# Patient Record
Sex: Female | Born: 1945
Health system: Southern US, Community
[De-identification: ages and names within clinical notes are randomized; demographics above are authoritative.]

## PROBLEM LIST (undated history)

## (undated) DIAGNOSIS — Z8619 Personal history of other infectious and parasitic diseases: Secondary | ICD-10-CM

## (undated) DIAGNOSIS — R52 Pain, unspecified: Secondary | ICD-10-CM

## (undated) DIAGNOSIS — F329 Major depressive disorder, single episode, unspecified: Secondary | ICD-10-CM

## (undated) DIAGNOSIS — B1710 Acute hepatitis C without hepatic coma: Secondary | ICD-10-CM

## (undated) DIAGNOSIS — L989 Disorder of the skin and subcutaneous tissue, unspecified: Secondary | ICD-10-CM

## (undated) DIAGNOSIS — N83209 Unspecified ovarian cyst, unspecified side: Secondary | ICD-10-CM

## (undated) DIAGNOSIS — K5730 Diverticulosis of large intestine without perforation or abscess without bleeding: Secondary | ICD-10-CM

## (undated) DIAGNOSIS — I10 Essential (primary) hypertension: Secondary | ICD-10-CM

## (undated) DIAGNOSIS — J984 Other disorders of lung: Secondary | ICD-10-CM

## (undated) DIAGNOSIS — T783XXA Angioneurotic edema, initial encounter: Secondary | ICD-10-CM

## (undated) DIAGNOSIS — E119 Type 2 diabetes mellitus without complications: Secondary | ICD-10-CM

## (undated) DIAGNOSIS — M949 Disorder of cartilage, unspecified: Secondary | ICD-10-CM

## (undated) DIAGNOSIS — F419 Anxiety disorder, unspecified: Secondary | ICD-10-CM

## (undated) DIAGNOSIS — Z972 Presence of dental prosthetic device (complete) (partial): Secondary | ICD-10-CM

## (undated) DIAGNOSIS — N6019 Diffuse cystic mastopathy of unspecified breast: Secondary | ICD-10-CM

## (undated) DIAGNOSIS — E785 Hyperlipidemia, unspecified: Secondary | ICD-10-CM

## (undated) DIAGNOSIS — B1920 Unspecified viral hepatitis C without hepatic coma: Secondary | ICD-10-CM

## (undated) DIAGNOSIS — IMO0002 Reserved for concepts with insufficient information to code with codable children: Secondary | ICD-10-CM

## (undated) DIAGNOSIS — M25519 Pain in unspecified shoulder: Secondary | ICD-10-CM

## (undated) DIAGNOSIS — Z9189 Other specified personal risk factors, not elsewhere classified: Secondary | ICD-10-CM

## (undated) DIAGNOSIS — Z973 Presence of spectacles and contact lenses: Secondary | ICD-10-CM

## (undated) DIAGNOSIS — R05 Cough: Secondary | ICD-10-CM

## (undated) DIAGNOSIS — R569 Unspecified convulsions: Secondary | ICD-10-CM

## (undated) DIAGNOSIS — M8580 Other specified disorders of bone density and structure, unspecified site: Secondary | ICD-10-CM

## (undated) DIAGNOSIS — M899 Disorder of bone, unspecified: Secondary | ICD-10-CM

## (undated) DIAGNOSIS — F411 Generalized anxiety disorder: Secondary | ICD-10-CM

## (undated) DIAGNOSIS — M549 Dorsalgia, unspecified: Secondary | ICD-10-CM

## (undated) DIAGNOSIS — J019 Acute sinusitis, unspecified: Secondary | ICD-10-CM

## (undated) DIAGNOSIS — Z9079 Acquired absence of other genital organ(s): Secondary | ICD-10-CM

## (undated) DIAGNOSIS — M545 Low back pain: Secondary | ICD-10-CM

## (undated) DIAGNOSIS — F32A Depression, unspecified: Secondary | ICD-10-CM

## (undated) DIAGNOSIS — S0990XA Unspecified injury of head, initial encounter: Secondary | ICD-10-CM

## (undated) HISTORY — DX: Diffuse cystic mastopathy of unspecified breast: N60.19

## (undated) HISTORY — DX: Hyperlipidemia, unspecified: E78.5

## (undated) HISTORY — DX: Dorsalgia, unspecified: M54.9

## (undated) HISTORY — DX: Pain in unspecified shoulder: M25.519

## (undated) HISTORY — DX: Disorder of bone, unspecified: M89.9

## (undated) HISTORY — DX: Other specified personal risk factors, not elsewhere classified: Z91.89

## (undated) HISTORY — DX: Reserved for concepts with insufficient information to code with codable children: IMO0002

## (undated) HISTORY — DX: Acute sinusitis, unspecified: J01.90

## (undated) HISTORY — DX: Other disorders of lung: J98.4

## (undated) HISTORY — PX: TUBAL LIGATION: SHX77

## (undated) HISTORY — DX: Anxiety disorder, unspecified: F41.9

## (undated) HISTORY — DX: Essential (primary) hypertension: I10

## (undated) HISTORY — DX: Acquired absence of other genital organ(s): Z90.79

## (undated) HISTORY — DX: Unspecified ovarian cyst, unspecified side: N83.209

## (undated) HISTORY — PX: APPENDECTOMY: SHX54

## (undated) HISTORY — DX: Pain, unspecified: R52

## (undated) HISTORY — DX: Low back pain: M54.5

## (undated) HISTORY — DX: Unspecified injury of head, initial encounter: S09.90XA

## (undated) HISTORY — DX: Cough: R05

## (undated) HISTORY — DX: Depression, unspecified: F32.A

## (undated) HISTORY — DX: Major depressive disorder, single episode, unspecified: F32.9

## (undated) HISTORY — DX: Angioneurotic edema, initial encounter: T78.3XXA

## (undated) HISTORY — DX: Generalized anxiety disorder: F41.1

## (undated) HISTORY — PX: OVARIAN CYST SURGERY: SHX726

## (undated) HISTORY — PX: LIVER BIOPSY: SHX301

## (undated) HISTORY — PX: COLONOSCOPY: SHX174

## (undated) HISTORY — DX: Other specified disorders of bone density and structure, unspecified site: M85.80

## (undated) HISTORY — DX: Disorder of cartilage, unspecified: M94.9

## (undated) HISTORY — DX: Acute hepatitis C without hepatic coma: B17.10

## (undated) HISTORY — DX: Personal history of other infectious and parasitic diseases: Z86.19

## (undated) HISTORY — DX: Disorder of the skin and subcutaneous tissue, unspecified: L98.9

## (undated) HISTORY — DX: Type 2 diabetes mellitus without complications: E11.9

## (undated) HISTORY — DX: Diverticulosis of large intestine without perforation or abscess without bleeding: K57.30

## (undated) HISTORY — DX: Unspecified viral hepatitis C without hepatic coma: B19.20

---

## 1990-03-27 DIAGNOSIS — Z9079 Acquired absence of other genital organ(s): Secondary | ICD-10-CM | POA: Insufficient documentation

## 1990-03-27 HISTORY — DX: Acquired absence of other genital organ(s): Z90.79

## 2003-06-10 ENCOUNTER — Ambulatory Visit (HOSPITAL_COMMUNITY): Admission: RE | Admit: 2003-06-10 | Discharge: 2003-06-10 | Payer: Self-pay | Admitting: Gastroenterology

## 2003-06-26 ENCOUNTER — Encounter (INDEPENDENT_AMBULATORY_CARE_PROVIDER_SITE_OTHER): Payer: Self-pay | Admitting: Specialist

## 2003-06-26 ENCOUNTER — Ambulatory Visit (HOSPITAL_COMMUNITY): Admission: RE | Admit: 2003-06-26 | Discharge: 2003-06-26 | Payer: Self-pay | Admitting: Gastroenterology

## 2003-08-15 ENCOUNTER — Observation Stay (HOSPITAL_COMMUNITY): Admission: EM | Admit: 2003-08-15 | Discharge: 2003-08-17 | Payer: Self-pay | Admitting: Emergency Medicine

## 2004-02-08 ENCOUNTER — Ambulatory Visit: Payer: Self-pay | Admitting: Internal Medicine

## 2004-02-12 ENCOUNTER — Ambulatory Visit: Payer: Self-pay | Admitting: Internal Medicine

## 2004-06-03 ENCOUNTER — Ambulatory Visit: Payer: Self-pay | Admitting: Internal Medicine

## 2004-06-10 ENCOUNTER — Ambulatory Visit (HOSPITAL_COMMUNITY): Admission: RE | Admit: 2004-06-10 | Discharge: 2004-06-10 | Payer: Self-pay | Admitting: Internal Medicine

## 2005-02-10 ENCOUNTER — Ambulatory Visit: Payer: Self-pay | Admitting: Internal Medicine

## 2005-02-15 ENCOUNTER — Ambulatory Visit: Payer: Self-pay | Admitting: Internal Medicine

## 2005-02-21 ENCOUNTER — Ambulatory Visit: Payer: Self-pay | Admitting: Internal Medicine

## 2005-04-07 ENCOUNTER — Ambulatory Visit: Payer: Self-pay | Admitting: Gastroenterology

## 2005-05-05 ENCOUNTER — Ambulatory Visit: Payer: Self-pay | Admitting: Internal Medicine

## 2005-06-12 ENCOUNTER — Observation Stay (HOSPITAL_COMMUNITY): Admission: EM | Admit: 2005-06-12 | Discharge: 2005-06-13 | Payer: Self-pay | Admitting: Emergency Medicine

## 2005-06-12 ENCOUNTER — Ambulatory Visit: Payer: Self-pay | Admitting: Internal Medicine

## 2005-06-20 ENCOUNTER — Ambulatory Visit: Payer: Self-pay | Admitting: Internal Medicine

## 2005-07-21 ENCOUNTER — Ambulatory Visit: Payer: Self-pay

## 2005-08-30 ENCOUNTER — Ambulatory Visit: Payer: Self-pay | Admitting: Internal Medicine

## 2005-10-13 ENCOUNTER — Ambulatory Visit (HOSPITAL_COMMUNITY): Admission: RE | Admit: 2005-10-13 | Discharge: 2005-10-13 | Payer: Self-pay | Admitting: Gastroenterology

## 2005-10-13 ENCOUNTER — Encounter (INDEPENDENT_AMBULATORY_CARE_PROVIDER_SITE_OTHER): Payer: Self-pay | Admitting: Specialist

## 2006-01-11 ENCOUNTER — Ambulatory Visit: Payer: Self-pay | Admitting: Internal Medicine

## 2006-03-12 ENCOUNTER — Ambulatory Visit: Payer: Self-pay | Admitting: Internal Medicine

## 2006-03-27 HISTORY — PX: SHOULDER ARTHROSCOPY: SHX128

## 2006-04-27 ENCOUNTER — Ambulatory Visit: Payer: Self-pay | Admitting: Internal Medicine

## 2006-04-27 LAB — CONVERTED CEMR LAB
Albumin: 3.6 g/dL (ref 3.5–5.2)
Alkaline Phosphatase: 94 units/L (ref 39–117)
BUN: 18 mg/dL (ref 6–23)
Basophils Absolute: 0.1 10*3/uL (ref 0.0–0.1)
Bilirubin Urine: NEGATIVE
Chloride: 108 meq/L (ref 96–112)
Cholesterol: 202 mg/dL (ref 0–200)
Creatinine, Ser: 0.7 mg/dL (ref 0.4–1.2)
Direct LDL: 144.1 mg/dL
Eosinophils Absolute: 0.1 10*3/uL (ref 0.0–0.6)
GFR calc non Af Amer: 91 mL/min
HDL: 39.1 mg/dL (ref >39.0)
Hemoglobin, Urine: NEGATIVE
Hgb A1c MFr Bld: 5.7 % (ref 4.6–6.0)
Ketones, ur: NEGATIVE mg/dL
MCHC: 34.7 g/dL (ref 30.0–36.0)
MCV: 96.4 fL (ref 78.0–100.0)
Monocytes Absolute: 0.9 10*3/uL — ABNORMAL HIGH (ref 0.2–0.7)
Monocytes Relative: 12.6 % — ABNORMAL HIGH (ref 3.0–11.0)
Potassium: 4.4 meq/L (ref 3.5–5.1)
RBC: 4.08 M/uL (ref 3.87–5.11)
RDW: 12.3 % (ref 11.5–14.6)
Total Bilirubin: 0.4 mg/dL (ref 0.3–1.2)
Total CHOL/HDL Ratio: 5.2
Triglycerides: 102 mg/dL (ref 0–149)
VLDL: 20 mg/dL (ref 0–40)
pH: 6.5 (ref 5.0–8.0)

## 2006-05-03 ENCOUNTER — Encounter: Admission: RE | Admit: 2006-05-03 | Discharge: 2006-05-03 | Payer: Self-pay | Admitting: Obstetrics and Gynecology

## 2006-05-03 ENCOUNTER — Ambulatory Visit (HOSPITAL_COMMUNITY): Admission: RE | Admit: 2006-05-03 | Discharge: 2006-05-03 | Payer: Self-pay | Admitting: Obstetrics and Gynecology

## 2006-05-03 ENCOUNTER — Ambulatory Visit: Payer: Self-pay | Admitting: Internal Medicine

## 2006-11-05 DIAGNOSIS — B1710 Acute hepatitis C without hepatic coma: Secondary | ICD-10-CM

## 2006-11-05 DIAGNOSIS — G4700 Insomnia, unspecified: Secondary | ICD-10-CM | POA: Insufficient documentation

## 2006-11-05 DIAGNOSIS — Z8619 Personal history of other infectious and parasitic diseases: Secondary | ICD-10-CM | POA: Insufficient documentation

## 2006-11-05 DIAGNOSIS — N6019 Diffuse cystic mastopathy of unspecified breast: Secondary | ICD-10-CM

## 2006-11-05 DIAGNOSIS — N83209 Unspecified ovarian cyst, unspecified side: Secondary | ICD-10-CM

## 2006-11-05 DIAGNOSIS — Z9189 Other specified personal risk factors, not elsewhere classified: Secondary | ICD-10-CM

## 2006-11-05 HISTORY — DX: Unspecified ovarian cyst, unspecified side: N83.209

## 2006-11-05 HISTORY — DX: Acute hepatitis C without hepatic coma: B17.10

## 2006-11-05 HISTORY — DX: Diffuse cystic mastopathy of unspecified breast: N60.19

## 2006-11-05 HISTORY — DX: Personal history of other infectious and parasitic diseases: Z86.19

## 2006-11-05 HISTORY — DX: Other specified personal risk factors, not elsewhere classified: Z91.89

## 2006-11-07 DIAGNOSIS — F32A Depression, unspecified: Secondary | ICD-10-CM | POA: Insufficient documentation

## 2006-11-07 DIAGNOSIS — M949 Disorder of cartilage, unspecified: Secondary | ICD-10-CM

## 2006-11-07 DIAGNOSIS — K5730 Diverticulosis of large intestine without perforation or abscess without bleeding: Secondary | ICD-10-CM

## 2006-11-07 DIAGNOSIS — F329 Major depressive disorder, single episode, unspecified: Secondary | ICD-10-CM

## 2006-11-07 DIAGNOSIS — F411 Generalized anxiety disorder: Secondary | ICD-10-CM

## 2006-11-07 DIAGNOSIS — E119 Type 2 diabetes mellitus without complications: Secondary | ICD-10-CM

## 2006-11-07 DIAGNOSIS — M899 Disorder of bone, unspecified: Secondary | ICD-10-CM

## 2006-11-07 DIAGNOSIS — E785 Hyperlipidemia, unspecified: Secondary | ICD-10-CM | POA: Insufficient documentation

## 2006-11-07 DIAGNOSIS — E1165 Type 2 diabetes mellitus with hyperglycemia: Secondary | ICD-10-CM | POA: Insufficient documentation

## 2006-11-07 DIAGNOSIS — F3289 Other specified depressive episodes: Secondary | ICD-10-CM

## 2006-11-07 HISTORY — DX: Generalized anxiety disorder: F41.1

## 2006-11-07 HISTORY — DX: Major depressive disorder, single episode, unspecified: F32.9

## 2006-11-07 HISTORY — DX: Hyperlipidemia, unspecified: E78.5

## 2006-11-07 HISTORY — DX: Type 2 diabetes mellitus without complications: E11.9

## 2006-11-07 HISTORY — DX: Disorder of bone, unspecified: M89.9

## 2006-11-07 HISTORY — DX: Diverticulosis of large intestine without perforation or abscess without bleeding: K57.30

## 2006-11-07 HISTORY — DX: Other specified depressive episodes: F32.89

## 2007-01-03 ENCOUNTER — Ambulatory Visit: Payer: Self-pay | Admitting: Internal Medicine

## 2007-04-16 ENCOUNTER — Ambulatory Visit (HOSPITAL_COMMUNITY): Admission: RE | Admit: 2007-04-16 | Discharge: 2007-04-16 | Payer: Self-pay | Admitting: Internal Medicine

## 2007-05-07 ENCOUNTER — Encounter: Payer: Self-pay | Admitting: Internal Medicine

## 2007-05-07 ENCOUNTER — Ambulatory Visit: Payer: Self-pay | Admitting: Internal Medicine

## 2007-05-10 ENCOUNTER — Telehealth: Payer: Self-pay | Admitting: Internal Medicine

## 2007-05-10 ENCOUNTER — Encounter: Payer: Self-pay | Admitting: Internal Medicine

## 2007-05-24 ENCOUNTER — Ambulatory Visit: Payer: Self-pay | Admitting: Internal Medicine

## 2007-05-24 LAB — CONVERTED CEMR LAB
Albumin: 3.7 g/dL (ref 3.5–5.2)
Alkaline Phosphatase: 82 units/L (ref 39–117)
BUN: 22 mg/dL (ref 6–23)
Basophils Absolute: 0.1 10*3/uL (ref 0.0–0.1)
Bilirubin Urine: NEGATIVE
Cholesterol: 186 mg/dL (ref 0–200)
Creatinine, Ser: 0.6 mg/dL (ref 0.4–1.2)
GFR calc Af Amer: 131 mL/min
HCT: 44.6 % (ref 36.0–46.0)
HDL: 46.8 mg/dL (ref >39.0)
Hemoglobin, Urine: NEGATIVE
Hemoglobin: 14.8 g/dL (ref 12.0–15.0)
Leukocytes, UA: NEGATIVE
Lymphocytes Relative: 20.2 % (ref 12.0–46.0)
MCHC: 33.3 g/dL (ref 30.0–36.0)
MCV: 92.9 fL (ref 78.0–100.0)
Monocytes Absolute: 0.7 10*3/uL (ref 0.2–0.7)
Monocytes Relative: 9.8 % (ref 3.0–11.0)
Neutro Abs: 4.5 10*3/uL (ref 1.4–7.7)
Neutrophils Relative %: 65.6 % (ref 43.0–77.0)
Potassium: 4.5 meq/L (ref 3.5–5.1)
RDW: 12.2 % (ref 11.5–14.6)
Sodium: 140 meq/L (ref 135–145)
TSH: 1.16 microintl units/mL (ref 0.35–5.50)
Total Bilirubin: 0.8 mg/dL (ref 0.3–1.2)
Total Protein: 6.5 g/dL (ref 6.0–8.3)
Triglycerides: 79 mg/dL (ref 0–149)
Urobilinogen, UA: 0.2 (ref 0.0–1.0)
VLDL: 16 mg/dL (ref 0–40)

## 2007-05-25 LAB — CONVERTED CEMR LAB
AST: 14 units/L (ref 0–37)
Basophils Absolute: 0.1 10*3/uL (ref 0.0–0.1)
Bilirubin, Direct: 0.2 mg/dL (ref 0.0–0.3)
Chloride: 106 meq/L (ref 96–112)
Cholesterol: 186 mg/dL (ref 0–200)
Eosinophils Absolute: 0.3 10*3/uL (ref 0.0–0.6)
Eosinophils Relative: 3.6 % (ref 0.0–5.0)
GFR calc Af Amer: 131 mL/min
GFR calc non Af Amer: 108 mL/min
Glucose, Bld: 148 mg/dL — ABNORMAL HIGH (ref 70–99)
HCT: 44.6 % (ref 36.0–46.0)
HDL: 46.8 mg/dL (ref >39.0)
Hemoglobin, Urine: NEGATIVE
Hgb A1c MFr Bld: 6.4 % — ABNORMAL HIGH (ref 4.6–6.0)
Lymphocytes Relative: 20.2 % (ref 12.0–46.0)
MCHC: 33.3 g/dL (ref 30.0–36.0)
MCV: 92.9 fL (ref 78.0–100.0)
Monocytes Absolute: 0.7 10*3/uL (ref 0.2–0.7)
Neutro Abs: 4.5 10*3/uL (ref 1.4–7.7)
Neutrophils Relative %: 65.6 % (ref 43.0–77.0)
Nitrite: NEGATIVE
Potassium: 4.5 meq/L (ref 3.5–5.1)
Sodium: 140 meq/L (ref 135–145)
TSH: 1.16 microintl units/mL (ref 0.35–5.50)
Total CHOL/HDL Ratio: 4
Urobilinogen, UA: 0.2 (ref 0.0–1.0)
WBC: 7 10*3/uL (ref 4.5–10.5)

## 2007-05-29 ENCOUNTER — Ambulatory Visit: Payer: Self-pay | Admitting: Internal Medicine

## 2007-05-29 DIAGNOSIS — M545 Low back pain, unspecified: Secondary | ICD-10-CM | POA: Insufficient documentation

## 2007-05-29 DIAGNOSIS — I10 Essential (primary) hypertension: Secondary | ICD-10-CM | POA: Insufficient documentation

## 2007-05-29 HISTORY — DX: Essential (primary) hypertension: I10

## 2007-05-29 HISTORY — DX: Low back pain: M54.5

## 2007-05-29 HISTORY — DX: Low back pain, unspecified: M54.50

## 2007-08-23 ENCOUNTER — Ambulatory Visit: Payer: Self-pay | Admitting: Internal Medicine

## 2007-08-23 LAB — CONVERTED CEMR LAB
ALT: 13 units/L (ref 0–35)
AST: 17 units/L (ref 0–37)
Bilirubin, Direct: 0.1 mg/dL (ref 0.0–0.3)
CO2: 27 meq/L (ref 19–32)
Chloride: 108 meq/L (ref 96–112)
Creatinine, Ser: 0.7 mg/dL (ref 0.4–1.2)
Creatinine,U: 122.9 mg/dL
Glucose, Bld: 93 mg/dL (ref 70–99)
Total Bilirubin: 0.5 mg/dL (ref 0.3–1.2)
Total CHOL/HDL Ratio: 3.9
Triglycerides: 98 mg/dL (ref 0–149)

## 2007-08-27 ENCOUNTER — Ambulatory Visit: Payer: Self-pay | Admitting: Internal Medicine

## 2007-11-01 ENCOUNTER — Ambulatory Visit: Payer: Self-pay | Admitting: Internal Medicine

## 2007-11-01 DIAGNOSIS — M549 Dorsalgia, unspecified: Secondary | ICD-10-CM

## 2007-11-01 HISTORY — DX: Dorsalgia, unspecified: M54.9

## 2007-11-12 ENCOUNTER — Encounter: Payer: Self-pay | Admitting: Internal Medicine

## 2007-11-25 ENCOUNTER — Telehealth (INDEPENDENT_AMBULATORY_CARE_PROVIDER_SITE_OTHER): Payer: Self-pay | Admitting: *Deleted

## 2007-12-10 ENCOUNTER — Ambulatory Visit: Payer: Self-pay | Admitting: Internal Medicine

## 2007-12-10 DIAGNOSIS — J019 Acute sinusitis, unspecified: Secondary | ICD-10-CM | POA: Insufficient documentation

## 2007-12-10 HISTORY — DX: Acute sinusitis, unspecified: J01.90

## 2008-01-10 ENCOUNTER — Ambulatory Visit: Payer: Self-pay | Admitting: Internal Medicine

## 2008-02-14 ENCOUNTER — Telehealth (INDEPENDENT_AMBULATORY_CARE_PROVIDER_SITE_OTHER): Payer: Self-pay | Admitting: *Deleted

## 2008-03-17 ENCOUNTER — Telehealth: Payer: Self-pay | Admitting: Internal Medicine

## 2008-05-21 ENCOUNTER — Telehealth (INDEPENDENT_AMBULATORY_CARE_PROVIDER_SITE_OTHER): Payer: Self-pay | Admitting: *Deleted

## 2008-05-22 ENCOUNTER — Ambulatory Visit: Payer: Self-pay | Admitting: Internal Medicine

## 2008-05-22 LAB — CONVERTED CEMR LAB
AST: 16 units/L (ref 0–37)
Basophils Absolute: 0.1 10*3/uL (ref 0.0–0.1)
Chloride: 106 meq/L (ref 96–112)
Cholesterol: 157 mg/dL (ref 0–200)
Creatinine, Ser: 0.7 mg/dL (ref 0.4–1.2)
Creatinine,U: 123.4 mg/dL
Eosinophils Absolute: 0.3 10*3/uL (ref 0.0–0.7)
GFR calc non Af Amer: 90 mL/min
Hemoglobin, Urine: NEGATIVE
Hgb A1c MFr Bld: 6.2 % — ABNORMAL HIGH (ref 4.6–6.0)
Ketones, ur: NEGATIVE mg/dL
LDL Cholesterol: 81 mg/dL (ref 0–99)
MCHC: 34.3 g/dL (ref 30.0–36.0)
MCV: 94.4 fL (ref 78.0–100.0)
Microalb Creat Ratio: 20.3 mg/g (ref 0.0–30.0)
Neutrophils Relative %: 56.5 % (ref 43.0–77.0)
Platelets: 259 10*3/uL (ref 150–400)
Potassium: 4.5 meq/L (ref 3.5–5.1)
TSH: 1.3 microintl units/mL (ref 0.35–5.50)
Total Bilirubin: 0.9 mg/dL (ref 0.3–1.2)
Triglycerides: 136 mg/dL (ref 0–149)
Urine Glucose: NEGATIVE mg/dL
Urobilinogen, UA: 0.2 (ref 0.0–1.0)
VLDL: 27 mg/dL (ref 0–40)

## 2008-05-29 ENCOUNTER — Ambulatory Visit: Payer: Self-pay | Admitting: Internal Medicine

## 2008-05-29 DIAGNOSIS — R05 Cough: Secondary | ICD-10-CM

## 2008-05-29 DIAGNOSIS — R059 Cough, unspecified: Secondary | ICD-10-CM | POA: Insufficient documentation

## 2008-05-29 HISTORY — DX: Cough: R05

## 2008-05-29 HISTORY — DX: Cough, unspecified: R05.9

## 2008-06-03 ENCOUNTER — Telehealth: Payer: Self-pay | Admitting: Internal Medicine

## 2008-08-31 ENCOUNTER — Encounter (INDEPENDENT_AMBULATORY_CARE_PROVIDER_SITE_OTHER): Payer: Self-pay | Admitting: *Deleted

## 2008-08-31 ENCOUNTER — Ambulatory Visit: Payer: Self-pay | Admitting: Internal Medicine

## 2008-08-31 DIAGNOSIS — IMO0002 Reserved for concepts with insufficient information to code with codable children: Secondary | ICD-10-CM

## 2008-08-31 HISTORY — DX: Reserved for concepts with insufficient information to code with codable children: IMO0002

## 2008-09-01 ENCOUNTER — Telehealth (INDEPENDENT_AMBULATORY_CARE_PROVIDER_SITE_OTHER): Payer: Self-pay | Admitting: *Deleted

## 2008-09-09 ENCOUNTER — Telehealth: Payer: Self-pay | Admitting: Internal Medicine

## 2008-09-15 ENCOUNTER — Telehealth (INDEPENDENT_AMBULATORY_CARE_PROVIDER_SITE_OTHER): Payer: Self-pay | Admitting: *Deleted

## 2008-12-02 ENCOUNTER — Ambulatory Visit: Payer: Self-pay | Admitting: Internal Medicine

## 2009-01-14 ENCOUNTER — Telehealth: Payer: Self-pay | Admitting: Endocrinology

## 2009-03-15 ENCOUNTER — Telehealth: Payer: Self-pay | Admitting: Internal Medicine

## 2009-05-13 ENCOUNTER — Telehealth: Payer: Self-pay | Admitting: Internal Medicine

## 2009-05-24 ENCOUNTER — Ambulatory Visit: Payer: Self-pay | Admitting: Internal Medicine

## 2009-05-25 LAB — CONVERTED CEMR LAB
Albumin: 3.7 g/dL (ref 3.5–5.2)
Basophils Absolute: 0 10*3/uL (ref 0.0–0.1)
Basophils Relative: 0.4 % (ref 0.0–3.0)
Calcium: 9.3 mg/dL (ref 8.4–10.5)
Chloride: 108 meq/L (ref 96–112)
Cholesterol: 174 mg/dL (ref 0–200)
Creatinine, Ser: 0.8 mg/dL (ref 0.4–1.2)
Eosinophils Absolute: 0.3 10*3/uL (ref 0.0–0.7)
GFR calc non Af Amer: 77 mL/min (ref >60)
HCT: 41.8 % (ref 36.0–46.0)
Hemoglobin, Urine: NEGATIVE
Hemoglobin: 14 g/dL (ref 12.0–15.0)
Hgb A1c MFr Bld: 6.3 % (ref 4.6–6.5)
LDL Cholesterol: 95 mg/dL (ref 0–99)
Leukocytes, UA: NEGATIVE
Lymphs Abs: 1.8 10*3/uL (ref 0.7–4.0)
MCHC: 33.6 g/dL (ref 30.0–36.0)
MCV: 96.6 fL (ref 78.0–100.0)
Monocytes Absolute: 0.8 10*3/uL (ref 0.1–1.0)
Neutro Abs: 4.1 10*3/uL (ref 1.4–7.7)
RDW: 12.2 % (ref 11.5–14.6)
Specific Gravity, Urine: 1.025 (ref 1.000–1.030)
TSH: 0.81 microintl units/mL (ref 0.35–5.50)
Total Protein: 7.3 g/dL (ref 6.0–8.3)
Triglycerides: 85 mg/dL (ref 0.0–149.0)
Urobilinogen, UA: 0.2 (ref 0.0–1.0)

## 2009-05-31 ENCOUNTER — Ambulatory Visit: Payer: Self-pay | Admitting: Internal Medicine

## 2009-05-31 ENCOUNTER — Encounter: Payer: Self-pay | Admitting: Internal Medicine

## 2009-05-31 DIAGNOSIS — J984 Other disorders of lung: Secondary | ICD-10-CM

## 2009-05-31 DIAGNOSIS — R911 Solitary pulmonary nodule: Secondary | ICD-10-CM | POA: Insufficient documentation

## 2009-05-31 HISTORY — DX: Other disorders of lung: J98.4

## 2009-06-02 ENCOUNTER — Ambulatory Visit: Payer: Self-pay | Admitting: Cardiovascular Disease

## 2009-06-04 ENCOUNTER — Ambulatory Visit (HOSPITAL_COMMUNITY): Admission: RE | Admit: 2009-06-04 | Discharge: 2009-06-04 | Payer: Self-pay | Admitting: Internal Medicine

## 2009-08-30 ENCOUNTER — Telehealth: Payer: Self-pay | Admitting: Internal Medicine

## 2009-09-06 ENCOUNTER — Telehealth: Payer: Self-pay | Admitting: Internal Medicine

## 2009-11-23 ENCOUNTER — Ambulatory Visit: Payer: Self-pay | Admitting: Internal Medicine

## 2009-11-23 LAB — CONVERTED CEMR LAB
Calcium: 9 mg/dL (ref 8.4–10.5)
Creatinine, Ser: 1 mg/dL (ref 0.4–1.2)
GFR calc non Af Amer: 62.29 mL/min (ref >60)
Glucose, Bld: 95 mg/dL (ref 70–99)
HDL: 37.3 mg/dL — ABNORMAL LOW (ref >39.00)
Hgb A1c MFr Bld: 6.5 % (ref 4.6–6.5)
Sodium: 142 meq/L (ref 135–145)
Total CHOL/HDL Ratio: 4
Triglycerides: 109 mg/dL (ref 0.0–149.0)

## 2009-11-29 ENCOUNTER — Encounter: Payer: Self-pay | Admitting: Internal Medicine

## 2009-11-30 ENCOUNTER — Ambulatory Visit: Payer: Self-pay | Admitting: Internal Medicine

## 2009-12-01 ENCOUNTER — Ambulatory Visit: Payer: Self-pay | Admitting: Cardiovascular Disease

## 2009-12-27 ENCOUNTER — Encounter: Payer: Self-pay | Admitting: Internal Medicine

## 2010-01-07 ENCOUNTER — Encounter: Admission: RE | Admit: 2010-01-07 | Discharge: 2010-01-07 | Payer: Self-pay | Admitting: Orthopedic Surgery

## 2010-01-24 ENCOUNTER — Ambulatory Visit: Payer: Self-pay | Admitting: Internal Medicine

## 2010-01-24 DIAGNOSIS — S0990XA Unspecified injury of head, initial encounter: Secondary | ICD-10-CM | POA: Insufficient documentation

## 2010-01-24 DIAGNOSIS — T783XXA Angioneurotic edema, initial encounter: Secondary | ICD-10-CM

## 2010-01-24 HISTORY — DX: Unspecified injury of head, initial encounter: S09.90XA

## 2010-01-24 HISTORY — DX: Angioneurotic edema, initial encounter: T78.3XXA

## 2010-02-11 ENCOUNTER — Ambulatory Visit (HOSPITAL_COMMUNITY): Admission: RE | Admit: 2010-02-11 | Discharge: 2010-02-11 | Payer: Self-pay | Admitting: Orthopedic Surgery

## 2010-02-14 ENCOUNTER — Telehealth (INDEPENDENT_AMBULATORY_CARE_PROVIDER_SITE_OTHER): Payer: Self-pay | Admitting: *Deleted

## 2010-04-01 ENCOUNTER — Telehealth: Payer: Self-pay | Admitting: Internal Medicine

## 2010-04-04 ENCOUNTER — Encounter: Payer: Self-pay | Admitting: Gastroenterology

## 2010-04-26 NOTE — Miscellaneous (Signed)
Summary: Orders Update  Clinical Lists Changes  Orders: Added new Referral order of Radiology Referral (Radiology) - Signed  Appended Document: Orders Update robin - to please inform pt of need for f/u ct -  I have ordered.   thanks  Appended Document: Orders Update Patient informed at November 30, 2009 office visit

## 2010-04-26 NOTE — Assessment & Plan Note (Signed)
Summary: CPX-LB   Vital Signs:  Patient profile:   65 year old female Height:      67 inches Weight:      179 pounds BMI:     28.14 O2 Sat:      94 % on Room air Temp:     97.6 degrees F oral Pulse rate:   76 / minute BP sitting:   132 / 74  (left arm) Cuff size:   regular  Vitals Entered ByZella Ball Ewing (May 31, 2009 8:33 AM)  O2 Flow:  Room air  CC: Adult Physical/RE   Primary Care Provider:  Corwin Levins MD  CC:  Adult Physical/RE.  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  did sprain the right ankle laterally last wk but now improved.  Plans to do more bike riding soon outside with the granddaughter.  Plans to do better with DM diet, does not want more meds or higher dose statin.  Pt denies polydipsia, polyuria, or low sugar symptoms such as shakiness improved with eating.  Overall good compliance with meds, trying to follow low chol, DM diet, wt stable, little excercise however  Incidently with 2 days onset mild fatigue , St adn prod cough brownish sputum, wihtout wheezing.  Problems Prior to Update: 1)  Bronchitis-acute  (ICD-466.0) 2)  Other Specified Sites of Sprains and Strains  (ICD-848.8) 3)  Cough  (ICD-786.2) 4)  Preventive Health Care  (ICD-V70.0) 5)  Sinusitis- Acute-nos  (ICD-461.9) 6)  Back Pain  (ICD-724.5) 7)  Low Back Pain  (ICD-724.2) 8)  Hypertension  (ICD-401.9) 9)  Preventive Health Care   (ICD-V70.0) 10)  Insomnia, Hx of  (ICD-V15.89) 11)  Osteopenia  (ICD-733.90) 12)  Depression  (ICD-311) 13)  Hyperlipidemia  (ICD-272.4) 14)  Diverticulosis, Colon  (ICD-562.10) 15)  Diabetes Mellitus, Type II  (ICD-250.00) 16)  Anxiety  (ICD-300.00) 17)  Fibrocystic Breast Disease  (ICD-610.1) 18)  Total Abdominal Hysterectomy, Hx of  (ICD-V45.77) 19)  Cyst, Ovarian Nec/nos  (ICD-620.2) 20)  Hepatitis C  (ICD-070.51) 21)  Hepatitis B, Hx of  (ICD-V12.09)  Medications Prior to Update: 1)  Fosamax 70 Mg Tabs (Alendronate Sodium) .Marland Kitchen.. 1 Tablet By Mouth Once A Week 2)  Pravachol 40 Mg  Tabs (Pravastatin Sodium) .... 2 By Mouth Qd 3)  Temazepam 30 Mg Caps (Temazepam) .Marland Kitchen.. 1 Capsule By Mouth At Bedtime 4)  Alprazolam 1 Mg  Tabs (Alprazolam) .Marland Kitchen.. 1 By Mouth Three Times A Day As Needed 5)  Metformin Hcl 500 Mg  Tabs (Metformin Hcl) .... 2 By Mouth Qam 6)  Lisinopril 20 Mg Tabs (Lisinopril) .Marland Kitchen.. 1 By Mouth Once Daily 7)  Ecotrin Low Strength 81 Mg  Tbec (Aspirin) .Marland Kitchen.. 1 By Mouth Qd 8)  Percocet 7.5-325 Mg Tabs (Oxycodone-Acetaminophen) .Marland Kitchen.. 1 By Mouth Two Times A Day As Needed 9)  Flexeril 5 Mg  Tabs (Cyclobenzaprine Hcl) .Marland Kitchen.. 1 By Mouth Three Times A  Day As Needed 10)  Centrum Silver Ultra Womens  Tabs (Multiple Vitamins-Minerals) .Marland Kitchen.. 1 Tab Daily 11)  Cymbalta 60 Mg Cpep (Duloxetine Hcl) .Marland Kitchen.. 1po Once Daily  Current Medications (verified): 1)  Fosamax 70 Mg Tabs (Alendronate Sodium) .Marland Kitchen.. 1 Tablet By Mouth Once A Week 2)  Pravachol 40 Mg  Tabs (Pravastatin Sodium) .... 2 By Mouth Once Daily 3)  Alprazolam 1 Mg  Tabs (Alprazolam) .Marland Kitchen.. 1 By Mouth Three Times A Day As Needed 4)  Metformin Hcl 500 Mg  Tabs (Metformin Hcl) .... 2 By Mouth Qam 5)  Lisinopril 20 Mg Tabs (Lisinopril) .Marland Kitchen.. 1 By Mouth Once Daily 6)  Ecotrin Low Strength 81 Mg  Tbec (Aspirin) .Marland Kitchen.. 1 By Mouth Qd 7)  Percocet 7.5-325 Mg Tabs (Oxycodone-Acetaminophen) .Marland Kitchen.. 1 By Mouth Two Times A Day As Needed 8)  Flexeril 5 Mg  Tabs  (Cyclobenzaprine Hcl) .Marland Kitchen.. 1 By Mouth Three Times A Day As Needed 9)  Centrum Silver Ultra Womens  Tabs (Multiple Vitamins-Minerals) .Marland Kitchen.. 1 Tab Daily 10)  Cymbalta 60 Mg Cpep (Duloxetine Hcl) .Marland Kitchen.. 1po Once Daily 11)  Azithromycin 250 Mg Tabs (Azithromycin) .... 2po Qd For 1 Day, Then 1po Qd For 4days, Then Stop 12)  Hydrocodone-Homatropine 5-1.5 Mg/71ml Syrp (Hydrocodone-Homatropine) .Marland Kitchen.. 1 Tsp By Mouth Q 6 Hrs As Needed Cough  Allergies (verified): 1)  ! Sulfa 2)  ! 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  Risk Factors: Smoking Status: current (05/29/2007) Packs/Day: 1PPD (11/05/2006)  Past Medical History: Hepatitis B, hx of Hepatitis C Hx of  Ovarian Cust Fibrocyctic Breast disease Anxiety Diabetes mellitus, type II Diverticulosis, colon Hyperlipidemia Depression Osteopenia Insomnia Hypertension Low back pain - chronic RBBB/LAFB - chronic  Review of Systems  The patient denies anorexia, fever, weight loss, vision loss, decreased hearing, hoarseness, chest pain, syncope, dyspnea on exertion, peripheral edema, prolonged cough, headaches, hemoptysis, abdominal pain, melena, hematochezia, severe indigestion/heartburn, hematuria, incontinence, muscle weakness, suspicious skin lesions, transient blindness, difficulty walking, depression, unusual weight change, abnormal bleeding, enlarged lymph nodes, and angioedema.         all otherwise negative per pt -    Physical Exam  General:  alert and overweight-appearing.   Head:  normocephalic and atraumatic.   Eyes:  vision grossly intact, pupils equal, and pupils round.   Ears:  R  ear normal and L ear normal.   Nose:  no external deformity and no nasal discharge.   Mouth:  pharyngeal erythema and fair dentition.   Neck:  supple and no masses.   Lungs:  normal respiratory effort and normal breath sounds.   Heart:  normal rate and regular rhythm.   Abdomen:  soft, non-tender, and normal bowel sounds.   Msk:  normal ROM and no joint tenderness.  , does have some mild puffiness and tender to right lateral mallealar area Extremities:  no edema, no erythema  Neurologic:  cranial nerves II-XII intact, strength normal in all extremities, and sensation intact to light touch.     Impression & Recommendations:  Problem # 1:  Preventive Health Care (ICD-V70.0) Overall doing well, age appropriate education and counseling updated and referral for appropriate preventive services done unless declined, immunizations up to date or declined,  diet counseling done if overweight, urged to quit smoking if smokes , most recent labs reviewed and current ordered if appropriate, ecg reviewed or declined (interpretation per ECG scanned in the EMR if done); information regarding Medicare Prevention requirements given if appropriate   Problem # 2:  BRONCHITIS-ACUTE (ICD-466.0)  with brownish sputum, no BRB, to stop smoking - to check cxr ; tx with antibx, cough medicine  Orders: T-2 View CXR, Same Day (71020.5TC)  Her updated medication list for this problem includes:    Azithromycin 250 Mg Tabs (Azithromycin) .Marland Kitchen... 2po qd for 1 day, then 1po qd for 4days, then stop    Hydrocodone-homatropine 5-1.5 Mg/30ml Syrp (Hydrocodone-homatropine) .Marland Kitchen... 1 tsp by mouth q 6 hrs as needed cough  Complete Medication List: 1)  Fosamax 70 Mg Tabs (Alendronate sodium) .Marland Kitchen.. 1 tablet by mouth once a week 2)  Pravachol 40 Mg Tabs (Pravastatin sodium) .... 2 by mouth once daily 3)  Alprazolam 1 Mg Tabs (Alprazolam) .Marland Kitchen.. 1 by mouth three times a day as needed 4)  Metformin Hcl 500 Mg Tabs (Metformin hcl) .... 2 by  mouth qam 5)  Lisinopril 20 Mg Tabs (Lisinopril) .Marland Kitchen.. 1 by mouth once daily 6)  Ecotrin Low Strength 81 Mg Tbec (Aspirin) .Marland Kitchen.. 1 by mouth qd 7)  Percocet 7.5-325 Mg Tabs (Oxycodone-acetaminophen) .Marland Kitchen.. 1 by mouth two times a day as needed 8)  Flexeril 5 Mg Tabs (Cyclobenzaprine hcl) .Marland Kitchen.. 1 by mouth three times a day as needed 9)  Centrum Silver Ultra Womens Tabs (Multiple vitamins-minerals) .Marland Kitchen.. 1 tab daily 10)  Cymbalta 60 Mg Cpep (Duloxetine hcl) .Marland Kitchen.. 1po once daily 11)  Azithromycin 250 Mg Tabs (Azithromycin) .... 2po qd for 1 day, then 1po qd for 4days, then stop 12)  Hydrocodone-homatropine 5-1.5 Mg/51ml Syrp (Hydrocodone-homatropine) .Marland Kitchen.. 1 tsp by mouth q 6 hrs as needed cough  Other Orders: EKG w/ Interpretation (93000) Admin 1st Vaccine (04540) Flu Vaccine 21yrs + (98119) Pneumococcal Vaccine (14782) Admin of Any Addtl Vaccine (95621)  Patient Instructions: 1)  please call for your yearly mammogram 2)  you had the flu shot and pneumonia shots today 3)  Continue all previous medications as before this visit  4)  Please take all new medications as prescribed - the antibiotic 5)  Please go to Radiology in the basement level for your X-Ray today  6)  Your EKG was no change today 7)  Please schedule a follow-up appointment in 6 months wth: 8)  BMP prior to visit, ICD-9: 250.02 9)  Lipid Panel prior to visit, ICD-9: 10)  HbgA1C prior to visit, ICD-9: 11)  All refills done as requested except the alprazolam, which was filled #90 with 2 refills  on feb 17 Prescriptions: FOSAMAX 70 MG TABS (ALENDRONATE SODIUM) 1 tablet by mouth once a week  #12 x 3   Entered and Authorized by:   Corwin Levins MD   Signed by:   Corwin Levins MD on 05/31/2009   Method used:   Print then Give to Patient   RxID:   [id number] PERCOCET 7.5-325 MG TABS (OXYCODONE-ACETAMINOPHEN) 1 by mouth two times a day as needed  #60 x 0   Entered and Authorized by:   Corwin Levins MD   Signed by:   Corwin Levins MD on  05/31/2009   Method used:   Print then Give to Patient   RxID:   [id number] CYMBALTA 60 MG CPEP (DULOXETINE HCL) 1po once daily  #90 x 3   Entered  and Authorized by:   Corwin Levins MD   Signed by:   Corwin Levins MD on 05/31/2009   Method used:   Print then Give to Patient   RxID:   [id number] LISINOPRIL 20 MG TABS (LISINOPRIL) 1 by mouth once daily  #90 x 3   Entered and Authorized by:   Corwin Levins MD   Signed by:   Corwin Levins MD on 05/31/2009   Method used:   Print then Give to Patient   RxID:   [id number] FLEXERIL 5 MG  TABS (CYCLOBENZAPRINE HCL) 1 by mouth three times a day as needed  #90 x 2   Entered and Authorized by:   Corwin Levins MD   Signed by:   Corwin Levins MD on 05/31/2009   Method used:   Print then Give to Patient   RxID:   [id number] PRAVACHOL 40 MG  TABS (PRAVASTATIN SODIUM) 2 by mouth once daily  #180 x 3   Entered and Authorized by:   Corwin Levins MD   Signed by:   Corwin Levins MD on 05/31/2009   Method used:   Print then Give to Patient   RxID:   [id number] METFORMIN HCL 500 MG  TABS (METFORMIN HCL) 2 by mouth qam  #180 x 3   Entered and Authorized by:   Corwin Levins MD   Signed by:   Corwin Levins MD on 05/31/2009   Method used:   Print then Give to Patient   RxID:   [id number] HYDROCODONE-HOMATROPINE 5-1.5 MG/5ML SYRP (HYDROCODONE-HOMATROPINE) 1 tsp by mouth q 6 hrs as needed cough  #6 oz x 1   Entered and Authorized by:   Corwin Levins MD   Signed by:   Corwin Levins MD on 05/31/2009   Method used:   Print then Give to Patient   RxID:   [id number] AZITHROMYCIN 250 MG TABS (AZITHROMYCIN) 2po qd for 1 day, then 1po qd for 4days, then stop  #6 x 1   Entered and Authorized by:   Corwin Levins MD   Signed by:   Corwin Levins MD on 05/31/2009   Method used:   Print then Give to Patient   RxID:   [id number]    Flu Vaccine Consent Questions     Do you have a history of severe allergic reactions to this vaccine?  no    Any prior history of allergic reactions to egg and/or gelatin? no    Do you have a sensitivity to the preservative Thimersol? no    Do you have a past history of Guillan-Barre Syndrome? no    Do you currently have an acute febrile illness? no    Have you ever had a severe reaction to latex? no    Vaccine information given and explained to patient? yes    Are you currently pregnant? no    Lot Number:[id number]   Exp Date:09/23/2009   Site Given  Left Deltoid IMbflu  Immunizations Administered:  Pneumonia Vaccine:    Vaccine Type: Pneumovax    Site: right deltoid    Mfr: Merck    Dose: 0.5 ml    Route: IM    Given by: Robin Ewing    Exp. Date: 11/08/2010    Lot #: [id number]    VIS given: 10/23/95 version given May 31, 2009.

## 2010-04-26 NOTE — Assessment & Plan Note (Signed)
Summary: SWOLLEN LIPS   STC   Vital Signs:  Patient profile:   65 year old female Height:      67 inches Weight:      170.38 pounds BMI:     26.78 O2 Sat:      94 % on Room air Temp:     97.1 degrees F oral Pulse rate:   100 / minute BP sitting:   100 / 62  (left arm) Cuff size:   regular  Vitals Entered By: Zella Ball Ewing CMA Duncan Dull) (January 24, 2010 2:30 PM)  O2 Flow:  Room air CC: Swollen lips/RE   Primary Care Provider:  Corwin Levins MD  CC:  Swollen lips/RE.  History of Present Illness: here with acute onset mild to mod lip sweling with tingling and slight discomfort, starte 7 am today, around the time of her morning meds;  no prior hx of this,  took children's benadryl without help;  overall good complaicne with prescriptoin meds,  no OTC other med use.  No recent change in diet or seafood.  No ST, cough , tongue sweling, throat swelling, sob/doe.   Due for right shoulder surgury soon in about 9 days, but c/o also pain and tenderness incidently at the deltoid insertion site, worse to abduct the arm.  Also incidently fell after tripped going back to the home from work for the benadryl, fell backwards going up the steps , hitting the crown of the head to the concrete.  Had immed mod to severe pain, overall much improved now over 4 hrs later but no LOC, dizziness or other headache.  Had local swelling that is some improved now, but no laceration or bleeding.   Pt denies polydipsia, polyuria, or low sugar symptoms such as shakiness improved with eating.  Overall good compliance with meds, trying to follow low chol, DM diet, wt stable, little excercise however   Preventive Screening-Counseling & Management      Drug Use:  no.    Problems Prior to Update: 1)  Head Trauma, Closed  (ICD-959.01) 2)  Angioedema  (ICD-995.1) 3)  Pulmonary Nodule, Left Upper Lobe  (ICD-518.89) 4)  Other Specified Sites of Sprains and Strains  (ICD-848.8) 5)  Cough  (ICD-786.2) 6)  Preventive Health Care   (ICD-V70.0) 7)  Sinusitis- Acute-nos  (ICD-461.9) 8)  Back Pain  (ICD-724.5) 9)  Low Back Pain  (ICD-724.2) 10)  Hypertension  (ICD-401.9) 11)  Preventive Health Care  (ICD-V70.0) 12)  Insomnia, Hx of  (ICD-V15.89) 13)  Osteopenia  (ICD-733.90) 14)  Depression  (ICD-311) 15)  Hyperlipidemia  (ICD-272.4) 16)  Diverticulosis, Colon  (ICD-562.10) 17)  Diabetes Mellitus, Type II  (ICD-250.00) 18)  Anxiety  (ICD-300.00) 19)  Fibrocystic Breast Disease  (ICD-610.1) 20)  Total Abdominal Hysterectomy, Hx of  (ICD-V45.77) 21)  Cyst, Ovarian Nec/nos  (ICD-620.2) 22)  Hepatitis C  (ICD-070.51) 23)  Hepatitis B, Hx of  (ICD-V12.09)  Medications Prior to Update: 1)  Fosamax 70 Mg Tabs (Alendronate Sodium) .Marland Kitchen.. 1 Tablet By Mouth Once A Week 2)  Pravachol 40 Mg  Tabs (Pravastatin Sodium) .... 2 By Mouth Once Daily 3)  Alprazolam 1 Mg  Tabs (Alprazolam) .Marland Kitchen.. 1 By Mouth Three Times A Day As Needed 4)  Metformin Hcl 500 Mg  Tabs (Metformin Hcl) .... 2 By Mouth Qam 5)  Lisinopril 20 Mg Tabs (Lisinopril) .Marland Kitchen.. 1 By Mouth Once Daily 6)  Ecotrin Low Strength 81 Mg  Tbec (Aspirin) .Marland Kitchen.. 1 By Mouth Qd 7)  Percocet 7.5-325 Mg Tabs (Oxycodone-Acetaminophen) .Marland Kitchen.. 1 By Mouth Two Times A Day As Needed M- To Fill Jan 29, 2010 8)  Flexeril 5 Mg  Tabs (Cyclobenzaprine Hcl) .Marland Kitchen.. 1 By Mouth Three Times A Day As Needed 9)  Centrum Silver Ultra Womens  Tabs (Multiple Vitamins-Minerals) .Marland Kitchen.. 1 Tab Daily 10)  Sertraline Hcl 100 Mg Tabs (Sertraline Hcl) .Marland Kitchen.. 1lpo Once Daily  Current Medications (verified): 1)  Fosamax 70 Mg Tabs (Alendronate Sodium) .Marland Kitchen.. 1 Tablet By Mouth Once A Week 2)  Pravachol 40 Mg  Tabs (Pravastatin Sodium) .... 2 By Mouth Once Daily 3)  Alprazolam 1 Mg  Tabs (Alprazolam) .Marland Kitchen.. 1 By Mouth Three Times A Day As Needed 4)  Metformin Hcl 500 Mg  Tabs (Metformin Hcl) .... 2 By Mouth Qam 5)  Ecotrin Low Strength 81 Mg  Tbec (Aspirin) .Marland Kitchen.. 1 By Mouth Qd 6)  Percocet 7.5-325 Mg Tabs  (Oxycodone-Acetaminophen) .Marland Kitchen.. 1 By Mouth Two Times A Day As Needed M- To Fill Jan 29, 2010 7)  Flexeril 5 Mg  Tabs (Cyclobenzaprine Hcl) .Marland Kitchen.. 1 By Mouth Three Times A Day As Needed 8)  Centrum Silver Ultra Womens  Tabs (Multiple Vitamins-Minerals) .Marland Kitchen.. 1 Tab Daily 9)  Sertraline Hcl 100 Mg Tabs (Sertraline Hcl) .Marland Kitchen.. 1lpo Once Daily 10)  Prednisone 10 Mg Tabs (Prednisone) .... 3po Qd For 3days, Then 2po Qd For 3days, Then 1po Qd For 3days, Then Stop 11)  Atenolol 50 Mg Tabs (Atenolol) .Marland Kitchen.. 1po Once Daily  Allergies (verified): 1)  ! Sulfa 2)  ! * Anaprox 3)  ! Ace Inhibitors  Past History:  Past Medical History: Last updated: 05/31/2009 Hepatitis B, hx of Hepatitis C Hx of  Ovarian Cust Fibrocyctic Breast disease Anxiety Diabetes mellitus, type II Diverticulosis, colon Hyperlipidemia Depression Osteopenia Insomnia Hypertension Low back pain - chronic RBBB/LAFB - chronic  Past Surgical History: Last updated: 05/29/2007 Caesarean section Hysterectomy Rotator cuff repair, L Oophorectomy Appendectomy  Social History: Last updated: 01/24/2010 Current Smoker Alcohol use-no Divorced 1 son finishing her masters degree - liberal studies work - IT consultant Drug use-no  Risk Factors: Smoking Status: current (05/29/2007) Packs/Day: 1PPD (11/05/2006)  Social History: Current Smoker Alcohol use-no Divorced 1 son 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   Head:  normocephalic and atraumatic.  except for crown with 1-2 cm area nondiscrete tender sweling without laceration Eyes:  vision grossly intact, pupils equal, and pupils round.   Ears:  R ear normal and L ear normal.   Nose:  no external deformity and no nasal discharge.   Mouth:  no gingival abnormalities and pharynx pink and moist.  , but upper and  lower lips with 1-2+ swelling, minimal tender and nonfluctuant, no erythema or perioral swelling or rash tongue without swelling Neck:  supple and no masses.   Lungs:  normal respiratory effort and normal breath sounds.   Heart:  normal rate and regular rhythm.   Extremities:  no edema, no erythema  Skin:  color normal and no rashes.     Impression & Recommendations:  Problem # 1:  ANGIOEDEMA (ICD-995.1)  likely due to ACEI - to d/c this, for depomedrol IM todya, and predpack for home, benadryl as needed   Orders: Depo- Medrol 40mg  (J1030) Depo- Medrol 80mg  (J1040) Admin of Therapeutic Inj  intramuscular or subcutaneous (44010)  Problem # 2:  HYPERTENSION (ICD-401.9)  The following medications were removed from the medication list:    Lisinopril 20 Mg Tabs (Lisinopril) .Marland Kitchen... 1 by mouth once daily Her updated medication list for this problem includes:    Atenolol 50 Mg Tabs (Atenolol) .Marland Kitchen... 1po once daily has shoulder surgury soon (9 days);  will change the ACEI to atenolol 50 mg - 1/2 per day until re-check BP thur or fri later this wk;  if SBP > 140, pt shoudl then take 50mg  in order to transition , and not fail preop next wk  BP today: 100/62 Prior BP: 104/70 (11/30/2009)  Labs Reviewed: K+: 4.8 (11/23/2009) Creat: : 1.0 (11/23/2009)   Chol: 142 (11/23/2009)   HDL: 37.30 (11/23/2009)   LDL: 83 (11/23/2009)   TG: 109.0 (11/23/2009)  Problem # 3:  DIABETES MELLITUS, TYPE II (ICD-250.00)  The following medications were removed from the medication list:    Lisinopril 20 Mg Tabs (Lisinopril) .Marland Kitchen... 1 by mouth once daily Her updated medication list for this problem includes:    Metformin Hcl 500 Mg Tabs (Metformin hcl) .Marland Kitchen... 2 by mouth qam    Ecotrin Low Strength 81 Mg Tbec (Aspirin) .Marland Kitchen... 1 by mouth qd  Labs Reviewed: Creat: 1.0 (11/23/2009)    Reviewed HgBA1c results: 6.5 (11/23/2009)  6.3 (05/24/2009) stable overall by hx and exam, ok to continue meds/tx as is , pt to  monitor cbg's closely on the prednisone, call for > 200  Problem # 4:  HEAD TRAUMA, CLOSED (ICD-959.01) with swelling and tender to crown,  no laceration, mild, no syncope or dizziness, will follow for now  Complete Medication List: 1)  Fosamax 70 Mg Tabs (Alendronate sodium) .Marland Kitchen.. 1 tablet by mouth once a week 2)  Pravachol 40 Mg Tabs (Pravastatin sodium) .... 2 by mouth once daily 3)  Alprazolam 1 Mg Tabs (Alprazolam) .Marland Kitchen.. 1 by mouth three times a day as needed 4)  Metformin Hcl 500 Mg Tabs (Metformin hcl) .... 2 by mouth qam 5)  Ecotrin Low Strength 81 Mg Tbec (Aspirin) .Marland Kitchen.. 1 by mouth qd 6)  Percocet 7.5-325 Mg Tabs (Oxycodone-acetaminophen) .Marland Kitchen.. 1 by mouth two times a day as needed m- to fill Jan 29, 2010 7)  Flexeril 5 Mg Tabs (Cyclobenzaprine hcl) .Marland Kitchen.. 1 by mouth three times a day as needed 8)  Centrum Silver Ultra Womens Tabs (Multiple vitamins-minerals) .Marland Kitchen.. 1 tab daily 9)  Sertraline Hcl 100 Mg Tabs (Sertraline hcl) .Marland Kitchen.. 1lpo once daily 10)  Prednisone 10 Mg Tabs (Prednisone) .... 3po qd for 3days, then 2po qd for 3days, then 1po qd for 3days, then stop 11)  Atenolol 50 Mg Tabs (Atenolol) .Marland Kitchen.. 1po once daily  Patient Instructions: 1)  you had the steroid shot today 2)  Please take all new medications as prescribed  - the prednisone 3)  You can also take Benadryl 50 mg q 6 hrs as needed swelling and itching 4)  stop the Lisinopril and tell anyone in the future that you are NOW Allergic 5)  Start the atenolol 50 mg at HALF pill per day starting Tomorrow Nov 1;  please re-check your BP Friday later this wk - if the top number is > 140, you should increase the atenolol to 50 mg so that your BP is not elevated at the preop evaluation for the right shoulder next week 6)  Please schedule a follow-up appointment in 4 months with CPX labs and: 7)  HbgA1C prior to visit,  ICD-9: 250.02 8)  Urine Microalbumin prior to visit, ICD-9: Prescriptions: ATENOLOL 50 MG TABS (ATENOLOL) 1po once daily   #30 x 11   Entered and Authorized by:   Corwin Levins MD   Signed by:   Corwin Levins MD on 01/24/2010   Method used:   Print then Give to Patient   RxID:   [id number] PREDNISONE 10 MG TABS (PREDNISONE) 3po qd for 3days, then 2po qd for 3days, then 1po qd for 3days, then stop  #18 x 0   Entered and Authorized by:   Corwin Levins MD   Signed by:   Corwin Levins MD on 01/24/2010   Method used:   Print then Give to Patient   RxID:   [id number]    Medication Administration  Injection # 1:    Medication: Depo- Medrol 40mg     Diagnosis: ANGIOEDEMA (ICD-995.1)    Route: IM    Site: LUOQ gluteus    Exp Date: 08/2012    Lot #: [id number]    Mfr: [employer]    Comments: Patient received 120mg  Depo-Medrol    Patient tolerated injection without complications    Given by: Zella Ball Ewing CMA Duncan Dull) (January 24, 2010 3:43 PM)  Injection # 2:    Medication: Depo- Medrol 80mg     Diagnosis: ANGIOEDEMA (ICD-995.1)    Route: IM    Site: LUOQ gluteus    Exp Date: 08/2012    Lot #: [id number]    Mfr: [employer]    Given by: Zella Ball Ewing CMA Duncan Dull) (January 24, 2010 3:43 PM)  Orders Added: 1)  Depo- Medrol 40mg  [J1030] 2)  Depo- Medrol 80mg  [J1040] 3)  Admin of Therapeutic Inj  intramuscular or subcutaneous [96372] 4)  Est. Patient Level IV [84696]

## 2010-04-26 NOTE — Progress Notes (Signed)
Summary: med refill  Phone Note Refill Request  on May 13, 2009 11:53 AM  Refills Requested: Medication #1:  ALPRAZOLAM 1 MG  TABS 1 by mouth three times a day as needed   Dosage confirmed as above?Dosage Confirmed   Notes: [facility] [phone] Initial call taken by: Scharlene Gloss,  May 13, 2009 11:54 AM  Follow-up for Phone Call        done hardcopy to LIM side B - dahlia  Follow-up by: Corwin Levins MD,  May 13, 2009 1:01 PM  Additional Follow-up for Phone Call Additional follow up Details #1::        rx faxed to pharmacy Additional Follow-up by: Margaret Pyle, CMA,  May 13, 2009 1:09 PM    New/Updated Medications: ALPRAZOLAM 1 MG  TABS (ALPRAZOLAM) 1 by mouth three times a day as needed Prescriptions: ALPRAZOLAM 1 MG  TABS (ALPRAZOLAM) 1 by mouth three times a day as needed  #90 x 3   Entered and Authorized by:   Corwin Levins MD   Signed by:   Corwin Levins MD on 05/13/2009   Method used:   Print then Give to Patient   RxID:   [id number]

## 2010-04-26 NOTE — Progress Notes (Signed)
Summary: Percocet  Phone Note Call from Patient Call back at Work Phone [phone]   Caller: Patient [phone] Summary of Call: pt called requesting refill of Percocet Initial call taken by: Margaret Pyle, CMA,  August 30, 2009 11:29 AM  Follow-up for Phone Call        Pt informed via VM, Rx in cabinet for pt pick up Follow-up by: Margaret Pyle, CMA,  August 30, 2009 11:39 AM    Prescriptions: PERCOCET 7.5-325 MG TABS (OXYCODONE-ACETAMINOPHEN) 1 by mouth two times a day as needed  #60 x 0   Entered and Authorized by:   Corwin Levins MD   Signed by:   Corwin Levins MD on 08/30/2009   Method used:   Print then Give to Patient   RxID:   [id number]  done hardcopy to LIM side B - dahlia  Corwin Levins MD  August 30, 2009 11:32 AM

## 2010-04-26 NOTE — Assessment & Plan Note (Signed)
Summary: 6 MTH FU STC   Vital Signs:  Patient profile:   65 year old female Height:      67.5 inches Weight:      174.38 pounds BMI:     27.01 O2 Sat:      94 % on Room air Temp:     98.2 degrees F oral Pulse rate:   85 / minute BP sitting:   104 / 70  (left arm) Cuff size:   regular  Vitals Entered By: Zella Ball Ewing CMA Duncan Dull) (November 30, 2009 8:05 AM)  O2 Flow:  Room air CC: 6 month followup/RE   Primary Care Lamin Chandley:  Corwin Levins MD  CC:  6 month followup/RE.  History of Present Illness: just finisher her masters degree;  fortunately lost spome wt - 182 to 174 intentionally;  Pt denies CP, worsening sob, doe, wheezing, orthopnea, pnd, worsening LE edema, palps, dizziness or syncope  Pt denies new neuro symptoms such as headache, facial or extremity weakness  Pt denies polydipsia, polyuria, or low sugar symptoms such as shakiness improved with eating.  Overall good compliance with meds, trying to follow low chol, DM diet, wt stable, little excercise however  CBG's in the los 100's but does not checkvery often.  had to stop the cymbalta due to cost, and is asking for substitute wellbutrin instead (did take yars ago, and has been talking to her pharmacist);  still taking the xanax;  unfort with  several conflicts with her boss at work and thnks she needs more xanax than what she has;  no suicidal ideation or panic.  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  Also still coughing but much less, think minimal and does not think she needs pulm.ent, or psych counseling.  Due for f;u Ct chest  Problems Prior to Update: 1)  Pulmonary Nodule, Left Upper Lobe  (ICD-518.89) 2)  Other Specified Sites of Sprains and Strains  (ICD-848.8) 3)  Cough  (ICD-786.2) 4)  Preventive Health Care  (ICD-V70.0) 5)  Sinusitis- Acute-nos  (ICD-461.9) 6)  Back Pain  (ICD-724.5) 7)  Low Back Pain  (ICD-724.2) 8)  Hypertension  (ICD-401.9) 9)  Preventive Health Care   (ICD-V70.0) 10)  Insomnia, Hx of  (ICD-V15.89) 11)  Osteopenia  (ICD-733.90) 12)  Depression  (ICD-311) 13)  Hyperlipidemia  (ICD-272.4) 14)  Diverticulosis, Colon  (ICD-562.10) 15)  Diabetes Mellitus, Type II  (ICD-250.00) 16)  Anxiety  (ICD-300.00) 17)  Fibrocystic Breast Disease  (ICD-610.1) 18)  Total Abdominal Hysterectomy, Hx of  (ICD-V45.77) 19)  Cyst, Ovarian Nec/nos  (ICD-620.2) 20)  Hepatitis C  (ICD-070.51) 21)  Hepatitis B, Hx of  (ICD-V12.09)  Medications Prior to Update: 1)  Fosamax 70 Mg Tabs (Alendronate Sodium) .Marland Kitchen.. 1 Tablet By Mouth Once A Week 2)  Pravachol 40 Mg  Tabs (Pravastatin Sodium) .... 2 By Mouth Once Daily 3)  Alprazolam 1 Mg  Tabs (Alprazolam) .Marland Kitchen.. 1 By Mouth Three Times A Day As Needed 4)  Metformin Hcl 500 Mg  Tabs (Metformin Hcl) .... 2 By Mouth Qam 5)  Lisinopril 20 Mg Tabs (Lisinopril) .Marland Kitchen.. 1 By Mouth Once Daily 6)  Ecotrin Low Strength 81 Mg  Tbec (Aspirin) .Marland Kitchen.. 1 By Mouth Qd 7)  Percocet 7.5-325 Mg Tabs (Oxycodone-Acetaminophen) .Marland Kitchen.. 1 By Mouth Two Times A Day As Needed 8)  Flexeril 5 Mg  Tabs (Cyclobenzaprine Hcl) .Marland Kitchen.. 1 By Mouth Three Times A Day As Needed 9)  Centrum  Silver Ultra Womens  Tabs (Multiple Vitamins-Minerals) .Marland Kitchen.. 1 Tab Daily 10)  Cymbalta 60 Mg Cpep (Duloxetine Hcl) .Marland Kitchen.. 1po Once Daily 11)  Azithromycin 250 Mg Tabs (Azithromycin) .... 2po Qd For 1 Day, Then 1po Qd For 4days, Then Stop 12)  Hydrocodone-Homatropine 5-1.5 Mg/25ml Syrp (Hydrocodone-Homatropine) .Marland Kitchen.. 1 Tsp By Mouth Q 6 Hrs As Needed Cough  Current Medications (verified): 1)  Fosamax 70 Mg Tabs (Alendronate Sodium) .Marland Kitchen.. 1 Tablet By Mouth Once A Week 2)  Pravachol 40 Mg  Tabs (Pravastatin Sodium) .... 2 By Mouth Once Daily 3)  Alprazolam 1 Mg  Tabs (Alprazolam) .Marland Kitchen.. 1 By Mouth Three Times A Day As Needed 4)  Metformin Hcl 500 Mg  Tabs (Metformin Hcl) .... 2 By Mouth Qam 5)  Lisinopril 20 Mg Tabs (Lisinopril) .Marland Kitchen.. 1 By Mouth Once Daily 6)  Ecotrin Low Strength 81 Mg  Tbec  (Aspirin) .Marland Kitchen.. 1 By Mouth Qd 7)  Percocet 7.5-325 Mg Tabs (Oxycodone-Acetaminophen) .Marland Kitchen.. 1 By Mouth Two Times A Day As Needed M- To Fill Jan 29, 2010 8)  Flexeril 5 Mg  Tabs (Cyclobenzaprine Hcl) .Marland Kitchen.. 1 By Mouth Three Times A Day As Needed 9)  Centrum Silver Ultra Womens  Tabs (Multiple Vitamins-Minerals) .Marland Kitchen.. 1 Tab Daily 10)  Sertraline Hcl 100 Mg Tabs (Sertraline Hcl) .Marland Kitchen.. 1lpo Once Daily  Allergies (verified): 1)  ! Sulfa 2)  ! * Anaprox  Past History:  Past Medical History: Last updated: 05/31/2009 Hepatitis B, hx of Hepatitis C Hx of  Ovarian Cust Fibrocyctic Breast disease Anxiety Diabetes mellitus, type II Diverticulosis, colon Hyperlipidemia Depression Osteopenia Insomnia Hypertension Low back pain - chronic RBBB/LAFB - chronic  Past Surgical History: Last updated: 05/29/2007 Caesarean section Hysterectomy Rotator cuff repair, L Oophorectomy Appendectomy  Social History: Last updated: 11/30/2009 Current Smoker Alcohol use-no Divorced 1 son finishing her masters degree - liberal studies work - paralegal  Risk Factors: Smoking Status: current (05/29/2007) Packs/Day: 1PPD (11/05/2006)  Social History: Reviewed history from 05/29/2007 and no changes required. 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  but improved Head:  normocephalic and atraumatic.   Eyes:  vision grossly intact, pupils equal, and pupils round.   Ears:  R ear normal and L ear normal.   Nose:  no external deformity and no external erythema.   Mouth:  no gingival abnormalities and pharynx pink and moist.   Neck:  supple and no masses.   Lungs:  normal respiratory effort and normal breath sounds.   Heart:  normal rate and regular rhythm.   Abdomen:  soft, non-tender, and normal bowel sounds.     Msk:  no joint tenderness and no joint swelling.   Extremities:  no edema, no erythema  Psych:  depressed affect and moderately anxious.     Impression & Recommendations:  Problem # 1:  COUGH (ICD-786.2) as per HPI,  d/w pt - chronic cough med not approp tx, and she declines further evaluatoin  Problem # 2:  PULMONARY NODULE, LEFT UPPER LOBE (ICD-518.89) for f/u CT chest as she is due, asympt except for above  Problem # 3:  LOW BACK PAIN (ICD-724.2)  Her updated medication list for this problem includes:    Ecotrin Low Strength 81 Mg Tbec (Aspirin) .Marland Kitchen... 1 by mouth qd    Percocet  7.5-325 Mg Tabs (Oxycodone-acetaminophen) .Marland Kitchen... 1 by mouth two times a day as needed m- to fill Jan 29, 2010    Flexeril 5 Mg Tabs (Cyclobenzaprine hcl) .Marland Kitchen... 1 by mouth three times a day as needed stable overall by hx and exam, ok to continue meds/tx as is   Problem # 4:  HYPERTENSION (ICD-401.9)  Her updated medication list for this problem includes:    Lisinopril 20 Mg Tabs (Lisinopril) .Marland Kitchen... 1 by mouth once daily  BP today: 104/70 Prior BP: 132/74 (05/31/2009)  Labs Reviewed: K+: 4.8 (11/23/2009) Creat: : 1.0 (11/23/2009)   Chol: 142 (11/23/2009)   HDL: 37.30 (11/23/2009)   LDL: 83 (11/23/2009)   TG: 109.0 (11/23/2009) stable overall by hx and exam, ok to continue meds/tx as is   Problem # 5:  HYPERLIPIDEMIA (ICD-272.4)  Her updated medication list for this problem includes:    Pravachol 40 Mg Tabs (Pravastatin sodium) .Marland Kitchen... 2 by mouth once daily  Labs Reviewed: SGOT: 18 (05/24/2009)   SGPT: 19 (05/24/2009)   HDL:37.30 (11/23/2009), 61.60 (05/24/2009)  LDL:83 (11/23/2009), 95 (05/24/2009)  Chol:142 (11/23/2009), 174 (05/24/2009)  Trig:109.0 (11/23/2009), 85.0 (05/24/2009) stable overall by hx and exam, ok to continue meds/tx as is   Problem # 6:  DIABETES MELLITUS, TYPE II (ICD-250.00)  Her updated medication list for this problem includes:    Metformin Hcl 500 Mg Tabs (Metformin hcl)  .Marland Kitchen... 2 by mouth qam    Lisinopril 20 Mg Tabs (Lisinopril) .Marland Kitchen... 1 by mouth once daily    Ecotrin Low Strength 81 Mg Tbec (Aspirin) .Marland Kitchen... 1 by mouth qd  Labs Reviewed: Creat: 1.0 (11/23/2009)    Reviewed HgBA1c results: 6.5 (11/23/2009)  6.3 (05/24/2009) stable overall by hx and exam, ok to continue meds/tx as is   Problem # 7:  ANXIETY (ICD-300.00)  The following medications were removed from the medication list:    Cymbalta 60 Mg Cpep (Duloxetine hcl) .Marland Kitchen... 1po once daily Her updated medication list for this problem includes:    Alprazolam 1 Mg Tabs (Alprazolam) .Marland Kitchen... 1 by mouth three times a day as needed    Sertraline Hcl 100 Mg Tabs (Sertraline hcl) .Marland Kitchen... 1lpo once daily ad the sertraline as above  Complete Medication List: 1)  Fosamax 70 Mg Tabs (Alendronate sodium) .Marland Kitchen.. 1 tablet by mouth once a week 2)  Pravachol 40 Mg Tabs (Pravastatin sodium) .... 2 by mouth once daily 3)  Alprazolam 1 Mg Tabs (Alprazolam) .Marland Kitchen.. 1 by mouth three times a day as needed 4)  Metformin Hcl 500 Mg Tabs (Metformin hcl) .... 2 by mouth qam 5)  Lisinopril 20 Mg Tabs (Lisinopril) .Marland Kitchen.. 1 by mouth once daily 6)  Ecotrin Low Strength 81 Mg Tbec (Aspirin) .Marland Kitchen.. 1 by mouth qd 7)  Percocet 7.5-325 Mg Tabs (Oxycodone-acetaminophen) .Marland Kitchen.. 1 by mouth two times a day as needed m- to fill Jan 29, 2010 8)  Flexeril 5 Mg Tabs (Cyclobenzaprine hcl) .Marland Kitchen.. 1 by mouth three times a day as needed 9)  Centrum Silver Ultra Womens Tabs (Multiple vitamins-minerals) .Marland Kitchen.. 1 tab daily 10)  Sertraline Hcl 100 Mg Tabs (Sertraline hcl) .Marland Kitchen.. 1lpo once daily  Other Orders: Admin 1st Vaccine ([id number]) Flu Vaccine 61yrs + [phone])  Patient Instructions: 1)  you had the flu shot today 2)  you are given the refills today 3)  You will be contacted about the referral(s) to: CT chest 4)  start the zoloft (generic is sertraline) at HALF pill for 3 days, the whole pill after that (100  mg) 5)  You can call in 3 to 4 wks if you think it is  helping but need a bit more (it can go up to 200 mg per day) 6)  Please schedule a follow-up appointment in 6 months with CPX labs and: 7)  HbgA1C prior to visit, ICD-9: 250.02 8)  Urine Microalbumin prior to visit, ICD-9: Prescriptions: SERTRALINE HCL 100 MG TABS (SERTRALINE HCL) 1lpo once daily  #90 x 3   Entered and Authorized by:   Corwin Levins MD   Signed by:   Corwin Levins MD on 11/30/2009   Method used:   Print then Give to Patient   RxID:   [id number] PERCOCET 7.5-325 MG TABS (OXYCODONE-ACETAMINOPHEN) 1 by mouth two times a day as needed m- to fill Jan 29, 2010  #120 x 0   Entered and Authorized by:   Corwin Levins MD   Signed by:   Corwin Levins MD on 11/30/2009   Method used:   Print then Give to Patient   RxID:   [id number] PERCOCET 7.5-325 MG TABS (OXYCODONE-ACETAMINOPHEN) 1 by mouth two times a day as needed m- to fill Dec 30, 2009  #120 x 0   Entered and Authorized by:   Corwin Levins MD   Signed by:   Corwin Levins MD on 11/30/2009   Method used:   Print then Give to Patient   RxID:   [id number] PERCOCET 7.5-325 MG TABS (OXYCODONE-ACETAMINOPHEN) 1 by mouth two times a day as needed m- to fill sept 6, 2011  #60 x 0   Entered and Authorized by:   Corwin Levins MD   Signed by:   Corwin Levins MD on 11/30/2009   Method used:   Print then Give to Patient   RxID:   [id number] ALPRAZOLAM 1 MG  TABS (ALPRAZOLAM) 1 by mouth three times a day as needed  #90 x 5   Entered and Authorized by:   Corwin Levins MD   Signed by:   Corwin Levins MD on 11/30/2009   Method used:   Print then Give to Patient   RxID:   [id number]    Flu Vaccine Consent Questions     Do you have a history of severe allergic reactions to this vaccine? no    Any prior history of allergic reactions to egg and/or gelatin? no    Do you have a sensitivity to the preservative Thimersol? no    Do you have a past history of Guillan-Barre Syndrome? no    Do you currently have an acute febrile  illness? no    Have you ever had a severe reaction to latex? no    Vaccine information given and explained to patient? yes    Are you currently pregnant? no    Lot Number:[id number]   Exp Date:09/24/2010   Site Given  Left Deltoid IMflu

## 2010-04-26 NOTE — Letter (Signed)
Summary: [facility]  [facility]   Imported By: Sherian Rein 01/03/2010 10:39:20  _____________________________________________________________________  External Attachment:    Type:   Image     Comment:   External Document

## 2010-04-26 NOTE — Miscellaneous (Signed)
Summary: Orders Update  Clinical Lists Changes  Problems: Added new problem of PULMONARY NODULE, LEFT UPPER LOBE (ICD-518.89) Orders: Added new Referral order of Radiology Referral (Radiology) - Signed 

## 2010-04-26 NOTE — Progress Notes (Signed)
Summary: medication Refill  Phone Note Refill Request Message from:  Pharmacy on September 06, 2009 1:21 PM  Refills Requested: Medication #1:  ALPRAZOLAM 1 MG  TABS 1 by mouth three times a day as needed   Dosage confirmed as above?Dosage Confirmed   Last Refilled: 05/13/2009   Notes: [facility], [phone] Initial call taken by: Scharlene Gloss,  September 06, 2009 1:22 PM  Follow-up for Phone Call        ok x 5 Follow-up by: Jacques Navy MD,  September 06, 2009 6:36 PM    Prescriptions: ALPRAZOLAM 1 MG  TABS (ALPRAZOLAM) 1 by mouth three times a day as needed  #90 x 4   Entered and Authorized by:   Scharlene Gloss   Signed by:   Scharlene Gloss on 09/07/2009   Method used:   Telephoned to ...       [facility]* (retail)       [address] [place], [place]  [address]       Ph: [phone] or [phone]       Fax: [phone]   RxID:   [id number]

## 2010-04-26 NOTE — Progress Notes (Signed)
Summary: Records Request  Faxed Stress to [place] [facility] ([phone]). Debby Freiberg  February 14, 2010 12:56 PM

## 2010-04-27 ENCOUNTER — Ambulatory Visit: Admit: 2010-04-27 | Payer: Self-pay | Admitting: Internal Medicine

## 2010-04-27 ENCOUNTER — Other Ambulatory Visit: Payer: Self-pay

## 2010-04-28 NOTE — Letter (Signed)
Summary: Colonoscopy Letter  [facility]  [address] [place], [place] [address]   Phone: [phone]  Fax: [phone]      April 04, 2010 MRN: [id number]   [facility] [place] [address] [place], [place]  [address]   Dear Ms. Besaw,   According to your medical record, it is time for you to schedule a Colonoscopy. The American Cancer Society recommends this procedure as a method to detect early colon cancer. Patients with a family history of colon cancer, or a personal history of colon polyps or inflammatory bowel disease are at increased risk.  This letter has been generated based on the recommendations made at the time of your procedure. If you feel that in your particular situation this may no longer apply, please contact our office.  Please call our office at [phone] to schedule this appointment or to update your records at your earliest convenience.  Thank you for cooperating with [place] to provide you with the very best care possible.   Sincerely,  Judie Petit T. Russella Dar, M.D.  [facility] Gastroenterology Division [phone]

## 2010-04-28 NOTE — Progress Notes (Signed)
Summary: Percocet  Phone Note Call from Patient   Caller: Patient [phone] Summary of Call: Pt called requesting refill of Percocet Initial call taken by: Margaret Pyle, CMA,  April 01, 2010 1:20 PM  Follow-up for Phone Call        Pt advised, Rx in cabinet for pt pick up Follow-up by: Margaret Pyle, CMA,  April 01, 2010 1:35 PM    New/Updated Medications: PERCOCET 7.5-325 MG TABS (OXYCODONE-ACETAMINOPHEN) 1 by mouth two times a day as needed m- to fill Apr 01, 2010 Prescriptions: PERCOCET 7.5-325 MG TABS (OXYCODONE-ACETAMINOPHEN) 1 by mouth two times a day as needed m- to fill Apr 01, 2010  #60 x 0   Entered and Authorized by:   Corwin Levins MD   Signed by:   Corwin Levins MD on 04/01/2010   Method used:   Print then Give to Patient   RxID:   [id number]  done hardcopy to LIM side B - dahlia Corwin Levins MD  April 01, 2010 1:23 PM

## 2010-05-26 ENCOUNTER — Encounter: Payer: Self-pay | Admitting: Internal Medicine

## 2010-05-27 ENCOUNTER — Other Ambulatory Visit: Payer: BC Managed Care – PPO

## 2010-05-27 ENCOUNTER — Encounter (INDEPENDENT_AMBULATORY_CARE_PROVIDER_SITE_OTHER): Payer: Self-pay | Admitting: *Deleted

## 2010-05-27 ENCOUNTER — Other Ambulatory Visit: Payer: Self-pay | Admitting: Internal Medicine

## 2010-05-27 DIAGNOSIS — E119 Type 2 diabetes mellitus without complications: Secondary | ICD-10-CM

## 2010-05-27 DIAGNOSIS — Z0000 Encounter for general adult medical examination without abnormal findings: Secondary | ICD-10-CM

## 2010-05-27 LAB — LIPID PANEL
HDL: 49.8 mg/dL (ref >39.00)
Total CHOL/HDL Ratio: 3
VLDL: 21 mg/dL (ref 0.0–40.0)

## 2010-05-27 LAB — TSH: TSH: 0.87 u[IU]/mL (ref 0.35–5.50)

## 2010-05-27 LAB — URINALYSIS
Bilirubin Urine: NEGATIVE
Nitrite: NEGATIVE
Total Protein, Urine: NEGATIVE

## 2010-05-27 LAB — CBC WITH DIFFERENTIAL/PLATELET
Basophils Absolute: 0 10*3/uL (ref 0.0–0.1)
Eosinophils Absolute: 0.2 10*3/uL (ref 0.0–0.7)
HCT: 41.6 % (ref 36.0–46.0)
Lymphs Abs: 1.4 10*3/uL (ref 0.7–4.0)
MCV: 93.4 fl (ref 78.0–100.0)
Monocytes Absolute: 0.5 10*3/uL (ref 0.1–1.0)
Monocytes Relative: 7.6 % (ref 3.0–12.0)
Platelets: 256 10*3/uL (ref 150.0–400.0)
RDW: 13.6 % (ref 11.5–14.6)

## 2010-05-27 LAB — BASIC METABOLIC PANEL
Calcium: 9.3 mg/dL (ref 8.4–10.5)
GFR: 77.88 mL/min (ref >60.00)
Sodium: 142 mEq/L (ref 135–145)

## 2010-05-27 LAB — HEPATIC FUNCTION PANEL
Alkaline Phosphatase: 80 U/L (ref 39–117)
Bilirubin, Direct: 0.2 mg/dL (ref 0.0–0.3)
Total Bilirubin: 0.4 mg/dL (ref 0.3–1.2)

## 2010-05-27 LAB — MICROALBUMIN / CREATININE URINE RATIO
Microalb Creat Ratio: 1.9 mg/g (ref 0.0–30.0)
Microalb, Ur: 3.4 mg/dL — ABNORMAL HIGH (ref 0.0–1.9)

## 2010-06-02 ENCOUNTER — Other Ambulatory Visit: Payer: Self-pay | Admitting: Internal Medicine

## 2010-06-02 ENCOUNTER — Encounter (INDEPENDENT_AMBULATORY_CARE_PROVIDER_SITE_OTHER): Payer: BC Managed Care – PPO | Admitting: Internal Medicine

## 2010-06-02 ENCOUNTER — Encounter: Payer: Self-pay | Admitting: Internal Medicine

## 2010-06-02 ENCOUNTER — Encounter (INDEPENDENT_AMBULATORY_CARE_PROVIDER_SITE_OTHER): Payer: Self-pay | Admitting: *Deleted

## 2010-06-02 DIAGNOSIS — L989 Disorder of the skin and subcutaneous tissue, unspecified: Secondary | ICD-10-CM

## 2010-06-02 DIAGNOSIS — M25519 Pain in unspecified shoulder: Secondary | ICD-10-CM | POA: Insufficient documentation

## 2010-06-02 DIAGNOSIS — Z0000 Encounter for general adult medical examination without abnormal findings: Secondary | ICD-10-CM

## 2010-06-02 DIAGNOSIS — M899 Disorder of bone, unspecified: Secondary | ICD-10-CM

## 2010-06-02 HISTORY — DX: Disorder of the skin and subcutaneous tissue, unspecified: L98.9

## 2010-06-02 HISTORY — DX: Pain in unspecified shoulder: M25.519

## 2010-06-02 NOTE — Miscellaneous (Signed)
Summary: Orders Update  Clinical Lists Changes  Orders: Added new Referral order of Radiology Referral (Radiology) - Signed  Appended Document: Orders Update robin - to call pt to inform that I went ahead and ordered the f/u CT chest for the LUL nodule first seen mar 2011, as she is now due  Appended Document: Orders Update called pt. left msg. to call back  Appended Document: Orders Update called pt left msg. on both numbers to call back  Appended Document: Orders Update called patient informed of above information

## 2010-06-03 ENCOUNTER — Inpatient Hospital Stay: Admission: RE | Admit: 2010-06-03 | Payer: BC Managed Care – PPO | Source: Ambulatory Visit

## 2010-06-07 LAB — COMPREHENSIVE METABOLIC PANEL
ALT: 14 U/L (ref 0–35)
AST: 16 U/L (ref 0–37)
Albumin: 3.9 g/dL (ref 3.5–5.2)
Calcium: 9.5 mg/dL (ref 8.4–10.5)
Creatinine, Ser: 0.71 mg/dL (ref 0.4–1.2)
GFR calc Af Amer: >60 mL/min (ref >60)
GFR calc non Af Amer: >60 mL/min (ref >60)
Sodium: 139 mEq/L (ref 135–145)
Total Protein: 6.9 g/dL (ref 6.0–8.3)

## 2010-06-07 LAB — CBC
MCH: 32.2 pg (ref 26.0–34.0)
MCHC: 33.9 g/dL (ref 30.0–36.0)
Platelets: 334 10*3/uL (ref 150–400)
RDW: 13.3 % (ref 11.5–15.5)

## 2010-06-07 LAB — SURGICAL PCR SCREEN: MRSA, PCR: NEGATIVE

## 2010-06-07 NOTE — Letter (Signed)
Summary: Pre Visit Letter Revised  [facility]  [address] [place], [place] [address]   Phone: [phone]  Fax: [phone]        06/02/2010 MRN: [id number] [facility] [place] [address] [place], [place]  [address]  [place]             Procedure Date:  07-18-10           Recall Colon--Dr. Russella Dar   Welcome to the Gastroenterology Division at [facility].    You are scheduled to see a nurse for your pre-procedure visit on 07-04-10 at 1:00p.m. on the 3rd floor at [facility], [address]  We ask that you try to arrive at our office 15 minutes prior to your appointment time to allow for check-in.  Please take a minute to review the attached form.  If you answer "Yes" to one or more of the questions on the first page, we ask that you call the person listed at your earliest opportunity.  If you answer "No" to all of the questions, please complete the rest of the form and bring it to your appointment.    Your nurse visit will consist of discussing your medical and surgical history, your immediate family medical history, and your medications.   If you are unable to list all of your medications on the form, please bring the medication bottles to your appointment and we will list them.  We will need to be aware of both prescribed and over the counter drugs.  We will need to know exact dosage information as well.    Please be prepared to read and sign documents such as consent forms, a financial agreement, and acknowledgement forms.  If necessary, and with your consent, a friend or relative is welcome to sit-in on the nurse visit with you.  Please bring your insurance card so that we may make a copy of it.  If your insurance requires a referral to see a specialist, please bring your referral form from your primary care physician.  No co-pay is required for this nurse visit.     If you cannot keep your appointment, please call [phone] to cancel or reschedule  prior to your appointment date.  This allows [place] the opportunity to schedule an appointment for another patient in need of care.    Thank you for choosing [facility] for your medical needs.  We appreciate the opportunity to care for you.  Please visit [place] at our website  to learn more about our practice.  Sincerely, The Gastroenterology Division

## 2010-06-07 NOTE — Assessment & Plan Note (Signed)
Summary: PHYSICAL---STC   Vital Signs:  Patient profile:   65 year old female Height:      67.5 inches Weight:      177 pounds BMI:     27.41 O2 Sat:      95 % on Room air Temp:     97.5 degrees F oral Pulse rate:   82 / minute BP sitting:   112 / 70  (left arm) Cuff size:   regular  Vitals Entered By: Zella Ball Ewing CMA Duncan Dull) (June 02, 2010 8:52 AM)  O2 Flow:  Room air  Preventive Care Screening     pt is curretnly calling to arrange the colonoscopy  CC: Adult Physical/RE   Primary Care Provider:  Corwin Levins MD  CC:  Adult Physical/RE.  History of Present Illness: here for wellness ; does have right chrnic rotater cuff tear and pain that she did have the MRI, fialed cortisone shot but cannot afford surgury at this time due to need to support herself and son and finnances;  Pt denies CP, worsening sob, doe, wheezing, orthopnea, pnd, worsening LE edema, palps, dizziness or syncope  Pt denies new neuro symptoms such as headache, facial or extremity weakness  Pt denies polydipsia, polyuria, or low sugar symptoms such as shakiness improved with eating.  Overall good compliance with meds, trying to follow low chol, DM diet, wt stable, little excercise however  Overall good compliance with meds, and good tolerability.  Denies worsening depressive symptoms, suicidal ideation, or panic, though has ongoing stress and anxeity and pain gets her down.  Pain relatively well controlled on current meds.  Still working full time, plans to cont work until 65 yo at least.  No fever, wt loss, night sweats, loss of appetite or other constitutional symptoms  Pt states good ability with ADL's, low fall risk, home safety reviewed and adequate, no significant change in hearing or vision, trying to follow lower chol diet, and occasionally active only with regular excercise.   Problems Prior to Update: 1)  Head Trauma, Closed  (ICD-959.01) 2)  Angioedema  (ICD-995.1) 3)  Pulmonary Nodule, Left Upper Lobe   (ICD-518.89) 4)  Other Specified Sites of Sprains and Strains  (ICD-848.8) 5)  Cough  (ICD-786.2) 6)  Preventive Health Care  (ICD-V70.0) 7)  Sinusitis- Acute-nos  (ICD-461.9) 8)  Back Pain  (ICD-724.5) 9)  Low Back Pain  (ICD-724.2) 10)  Hypertension  (ICD-401.9) 11)  Preventive Health Care  (ICD-V70.0) 12)  Insomnia, Hx of  (ICD-V15.89) 13)  Osteopenia  (ICD-733.90) 14)  Depression  (ICD-311) 15)  Hyperlipidemia  (ICD-272.4) 16)  Diverticulosis, Colon  (ICD-562.10) 17)  Diabetes Mellitus, Type II  (ICD-250.00) 18)  Anxiety  (ICD-300.00) 19)  Fibrocystic Breast Disease  (ICD-610.1) 20)  Total Abdominal Hysterectomy, Hx of  (ICD-V45.77) 21)  Cyst, Ovarian Nec/nos  (ICD-620.2) 22)  Hepatitis C  (ICD-070.51) 23)  Hepatitis B, Hx of  (ICD-V12.09)  Medications Prior to Update: 1)  Fosamax 70 Mg Tabs (Alendronate Sodium) .Marland Kitchen.. 1 Tablet By Mouth Once A Week 2)  Pravachol 40 Mg  Tabs (Pravastatin Sodium) .... 2 By Mouth Once Daily 3)  Alprazolam 1 Mg  Tabs (Alprazolam) .Marland Kitchen.. 1 By Mouth Three Times A Day As Needed 4)  Metformin Hcl 500 Mg  Tabs (Metformin Hcl) .... 2 By Mouth Qam 5)  Ecotrin Low Strength 81 Mg  Tbec (Aspirin) .Marland Kitchen.. 1 By Mouth Qd 6)  Percocet 7.5-325 Mg Tabs (Oxycodone-Acetaminophen) .Marland Kitchen.. 1 By Mouth Two Times A  Day As Needed M- To Fill Apr 01, 2010 7)  Flexeril 5 Mg  Tabs (Cyclobenzaprine Hcl) .Marland Kitchen.. 1 By Mouth Three Times A Day As Needed 8)  Centrum Silver Ultra Womens  Tabs (Multiple Vitamins-Minerals) .Marland Kitchen.. 1 Tab Daily 9)  Sertraline Hcl 100 Mg Tabs (Sertraline Hcl) .Marland Kitchen.. 1lpo Once Daily 10)  Prednisone 10 Mg Tabs (Prednisone) .... 3po Qd For 3days, Then 2po Qd For 3days, Then 1po Qd For 3days, Then Stop 11)  Atenolol 50 Mg Tabs (Atenolol) .Marland Kitchen.. 1po Once Daily  Current Medications (verified): 1)  Fosamax 70 Mg Tabs (Alendronate Sodium) .Marland Kitchen.. 1 Tablet By Mouth Once A Week 2)  Pravachol 40 Mg  Tabs (Pravastatin Sodium) .... 2 By Mouth Once Daily 3)  Alprazolam 1 Mg  Tabs  (Alprazolam) .Marland Kitchen.. 1 By Mouth Three Times A Day As Needed 4)  Metformin Hcl 500 Mg  Tabs (Metformin Hcl) .... 2 By Mouth Qam 5)  Ecotrin Low Strength 81 Mg  Tbec (Aspirin) .Marland Kitchen.. 1 By Mouth Qd 6)  Percocet 7.5-325 Mg Tabs (Oxycodone-Acetaminophen) .Marland Kitchen.. 1 By Mouth Three Times A Day As Needed  - To Fill Aug 01, 2010 7)  Flexeril 5 Mg  Tabs (Cyclobenzaprine Hcl) .Marland Kitchen.. 1 By Mouth Three Times A Day As Needed 8)  Centrum Silver Ultra Womens  Tabs (Multiple Vitamins-Minerals) .Marland Kitchen.. 1 Tab Daily 9)  Sertraline Hcl 100 Mg Tabs (Sertraline Hcl) .Marland Kitchen.. 1lpo Once Daily 10)  Atenolol 50 Mg Tabs (Atenolol) .Marland Kitchen.. 1po Once Daily  Allergies (verified): 1)  ! Sulfa 2)  ! * Anaprox 3)  ! Ace Inhibitors  Past History:  Past Medical History: Last updated: 05/31/2009 Hepatitis B, hx of Hepatitis C Hx of  Ovarian Cust Fibrocyctic Breast disease Anxiety Diabetes mellitus, type II Diverticulosis, colon Hyperlipidemia Depression Osteopenia Insomnia Hypertension Low back pain - chronic RBBB/LAFB - chronic  Past Surgical History: Last updated: 05/29/2007 Caesarean section Hysterectomy Rotator cuff repair, L Oophorectomy Appendectomy  Family History: Last updated: 05/29/2008 father with lung cancer, COPD - died at 2 yo  Social History: Last updated: 01/24/2010 Current Smoker Alcohol use-no Divorced 1 son finishing her masters degree - liberal studies work - IT consultant Drug use-no  Risk Factors: Smoking Status: current (05/29/2007) Packs/Day: 1PPD (11/05/2006)  Review of Systems  The patient denies anorexia, fever, vision loss, decreased hearing, hoarseness, chest pain, syncope, dyspnea on exertion, peripheral edema, prolonged cough, headaches, hemoptysis, abdominal pain, melena, hematochezia, severe indigestion/heartburn, hematuria, muscle weakness, suspicious skin lesions, transient blindness, difficulty walking, depression, unusual weight change, abnormal bleeding, enlarged lymph nodes, and  angioedema.         all otherwise negative per pt -    Physical Exam  General:  alert and well-developed.   Head:  normocephalic and atraumatic.   Eyes:  vision grossly intact, pupils equal, and pupils round.   Ears:  R ear normal and L ear normal.   Nose:  no external deformity and no nasal discharge.   Mouth:  no gingival abnormalities and pharynx pink and moist.   Neck:  supple and no masses.   Lungs:  normal respiratory effort and normal breath sounds.   Heart:  normal rate and regular rhythm.   Abdomen:  soft, non-tender, and normal bowel sounds.   Msk:  no joint tenderness and no joint swelling.  , and FROM except for marked pain to abduction to 40 degrees only RUE Extremities:  no edema, no erythema  Neurologic:  cranial nerves II-XII intact, strength normal in all extremities, and sensation  intact to light touch.   Skin:  color normal and no rashes.  , does have 1 wk onset dark are approx 10 mm to right distal post arm nontender, nonraise but very black and somewhat irreg edges, not two-toned Psych:  dysphoric affect and slightly anxious.     Impression & Recommendations:  Problem # 1:  Preventive Health Care (ICD-V70.0) Assessment New Overall doing well, age appropriate education and counseling updated, referral for preventive services and immunizations addressed, dietary counseling and smoking status adressed , most recent labs reviewed, ecg reviewed I have personally reviewed and have noted 1.The patient's medical and social history 2.Their use of alcohol, tobacco or illicit drugs 3.Their current medications and supplements 4. Functional ability including ADL's, fall risk, home safety risk, hearing & visual impairment 5.Diet and physical activities 6.Evidence for depression or mood disorders The patients weight, height, BMI  have been recorded in the chart I have made referrals, counseling and provided education to the patient based review of the above  Orders: EKG w/  Interpretation (93000)  Problem # 2:  SKIN LESION (ICD-709.9)  left post distal arm, 1 wk only and ? bruise like but approx 10 mm, very black and irreg, not raised -  for derm referral in on wk if not improved  Orders: Dermatology Referral (Derma)  Problem # 3:  SHOULDER PAIN, RIGHT, CHRONIC (ICD-719.41)  Her updated medication list for this problem includes:    Ecotrin Low Strength 81 Mg Tbec (Aspirin) .Marland Kitchen... 1 by mouth qd    Percocet 7.5-325 Mg Tabs (Oxycodone-acetaminophen) .Marland Kitchen... 1 by mouth three times a day as needed  - to fill Aug 01, 2010    Flexeril 5 Mg Tabs (Cyclobenzaprine hcl) .Marland Kitchen... 1 by mouth three times a day as needed chronic with proven tear per pt on MRI,  cannot afford surgury at this time, treat as above, f/u any worsening signs or symptoms, she hopes to be able to do surgury in the next 6 mo  Problem # 4:  HYPERTENSION (ICD-401.9)  Her updated medication list for this problem includes:    Atenolol 50 Mg Tabs (Atenolol) .Marland Kitchen... 1po once daily  BP today: 112/70 Prior BP: 100/62 (01/24/2010)  Labs Reviewed: K+: 5.0 (05/27/2010) Creat: : 0.8 (05/27/2010)   Chol: 155 (05/27/2010)   HDL: 49.80 (05/27/2010)   LDL: 84 (05/27/2010)   TG: 105.0 (05/27/2010) stable overall by hx and exam, ok to continue meds/tx as is   Problem # 5:  DIABETES MELLITUS, TYPE II (ICD-250.00)  Her updated medication list for this problem includes:    Metformin Hcl 500 Mg Tabs (Metformin hcl) .Marland Kitchen... 2 by mouth qam    Ecotrin Low Strength 81 Mg Tbec (Aspirin) .Marland Kitchen... 1 by mouth qd  Labs Reviewed: Creat: 0.8 (05/27/2010)    Reviewed HgBA1c results: 6.9 (05/27/2010)  6.5 (11/23/2009) stable overall by hx and exam, ok to continue meds/tx as is   Complete Medication List: 1)  Fosamax 70 Mg Tabs (Alendronate sodium) .Marland Kitchen.. 1 tablet by mouth once a week 2)  Pravachol 40 Mg Tabs (Pravastatin sodium) .... 2 by mouth once daily 3)  Alprazolam 1 Mg Tabs (Alprazolam) .Marland Kitchen.. 1 by mouth three times a day as  needed 4)  Metformin Hcl 500 Mg Tabs (Metformin hcl) .... 2 by mouth qam 5)  Ecotrin Low Strength 81 Mg Tbec (Aspirin) .Marland Kitchen.. 1 by mouth qd 6)  Percocet 7.5-325 Mg Tabs (Oxycodone-acetaminophen) .Marland Kitchen.. 1 by mouth three times a day as needed  - to fill  Aug 01, 2010 7)  Flexeril 5 Mg Tabs (Cyclobenzaprine hcl) .Marland Kitchen.. 1 by mouth three times a day as needed 8)  Centrum Silver Ultra Womens Tabs (Multiple vitamins-minerals) .Marland Kitchen.. 1 tab daily 9)  Sertraline Hcl 100 Mg Tabs (Sertraline hcl) .Marland Kitchen.. 1lpo once daily 10)  Atenolol 50 Mg Tabs (Atenolol) .Marland Kitchen.. 1po once daily  Other Orders: T-Bone Densitometry [phone]) T-Lumbar Vertebral Assessment (36644)  Patient Instructions: 1)  Plesae schedule the bone density before leaving today 2)  You will be contacted about the referral(s) to: dermatology (No sooner than one wk) 3)  You are given the refills today 4)  Please call to arrange the colonscopy as you have planned, since you are now due for this 5)  Continue all previous medications as before this visit  6)  Please schedule a follow-up appointment in 6 months with: 7)  BMP prior to visit, ICD-9: 250.02 8)  Lipid Panel prior to visit, ICD-9: 9)  HbgA1C prior to visit, ICD-9: Prescriptions: FLEXERIL 5 MG  TABS (CYCLOBENZAPRINE HCL) 1 by mouth three times a day as needed  #90 x 2   Entered and Authorized by:   Corwin Levins MD   Signed by:   Corwin Levins MD on 06/02/2010   Method used:   Print then Give to Patient   RxID:   [id number] PERCOCET 7.5-325 MG TABS (OXYCODONE-ACETAMINOPHEN) 1 by mouth three times a day as needed  - to fill Aug 01, 2010  #90 x 0   Entered and Authorized by:   Corwin Levins MD   Signed by:   Corwin Levins MD on 06/02/2010   Method used:   Print then Give to Patient   RxID:   [id number] PERCOCET 7.5-325 MG TABS (OXYCODONE-ACETAMINOPHEN) 1 by mouth three times a day as needed  - to fill Jul 02, 2010  #90 x 0   Entered and Authorized by:   Corwin Levins MD   Signed by:   Corwin Levins MD on 06/02/2010   Method used:   Print then Give to Patient   RxID:   [id number] PERCOCET 7.5-325 MG TABS (OXYCODONE-ACETAMINOPHEN) 1 by mouth three times a day as needed  - to fill Jun 02, 2010  #90 x 0   Entered and Authorized by:   Corwin Levins MD   Signed by:   Corwin Levins MD on 06/02/2010   Method used:   Print then Give to Patient   RxID:   [id number]    Orders Added: 1)  EKG w/ Interpretation [93000] 2)  Dermatology Referral [Derma] 3)  T-Bone Densitometry [77080] 4)  T-Lumbar Vertebral Assessment [77082] 5)  Est. Patient 40-64 years [17616]

## 2010-06-21 ENCOUNTER — Other Ambulatory Visit: Payer: Self-pay

## 2010-06-21 DIAGNOSIS — F419 Anxiety disorder, unspecified: Secondary | ICD-10-CM

## 2010-06-21 MED ORDER — ALPRAZOLAM 1 MG PO TABS: 1.0000 mg | ORAL_TABLET | Freq: Three times a day (TID) | ORAL | Status: DC | PRN

## 2010-06-21 NOTE — Telephone Encounter (Signed)
[facility] requesting refill on patients Alprazolam 1 mg. Last refill was on 11/30/09 #90 with 5 refills and last OV 06/02/10.

## 2010-06-21 NOTE — Telephone Encounter (Signed)
Done hardcopy to dahlia/LIM B  

## 2010-06-21 NOTE — Telephone Encounter (Signed)
Faxed hardcopy to patients pharmacy

## 2010-06-30 ENCOUNTER — Telehealth: Payer: Self-pay

## 2010-06-30 NOTE — Telephone Encounter (Signed)
Ok for early percocet this time,  Crystal Thornton to check on the K rx for ms hayes

## 2010-06-30 NOTE — Telephone Encounter (Signed)
Called pharmacy to ok refill on Percocet. Informed the Potassium prescription was filled this morning

## 2010-06-30 NOTE — Telephone Encounter (Signed)
Pharmacy called stating pt has Rx for Percocet to be filled 04/07. Pt states she is going out of town today and is requesting MD permission to allow pharmacy to fill today. Please advise. Pharmacy also stated pt has dropped off Rx for potassium for Lemar Livings, also a pt of Dr Raphael Gibney. Pharmacy is requesting MD advise on Rx, okay to fill of discard ( do you know if these pt's know each other?)

## 2010-07-04 ENCOUNTER — Ambulatory Visit (AMBULATORY_SURGERY_CENTER): Payer: BC Managed Care – PPO | Admitting: *Deleted

## 2010-07-04 VITALS — Ht 67.0 in | Wt 180.0 lb

## 2010-07-04 DIAGNOSIS — K639 Disease of intestine, unspecified: Secondary | ICD-10-CM

## 2010-07-04 MED ORDER — PEG-KCL-NACL-NASULF-NA ASC-C 100 G PO SOLR: ORAL | Status: DC

## 2010-07-18 ENCOUNTER — Ambulatory Visit (AMBULATORY_SURGERY_CENTER): Payer: BC Managed Care – PPO | Admitting: Gastroenterology

## 2010-07-18 ENCOUNTER — Encounter: Payer: Self-pay | Admitting: Gastroenterology

## 2010-07-18 DIAGNOSIS — Z1211 Encounter for screening for malignant neoplasm of colon: Secondary | ICD-10-CM

## 2010-07-18 DIAGNOSIS — D126 Benign neoplasm of colon, unspecified: Secondary | ICD-10-CM

## 2010-07-18 DIAGNOSIS — K5730 Diverticulosis of large intestine without perforation or abscess without bleeding: Secondary | ICD-10-CM

## 2010-07-18 DIAGNOSIS — D129 Benign neoplasm of anus and anal canal: Secondary | ICD-10-CM

## 2010-07-18 DIAGNOSIS — D128 Benign neoplasm of rectum: Secondary | ICD-10-CM

## 2010-07-18 LAB — GLUCOSE, CAPILLARY
Glucose-Capillary: 144 mg/dL — ABNORMAL HIGH (ref 70–99)
Glucose-Capillary: 102 mg/dL — ABNORMAL HIGH (ref 70–99)

## 2010-07-18 MED ORDER — SODIUM CHLORIDE 0.9 % IV SOLN: 500.0000 mL | INTRAVENOUS | Status: DC

## 2010-07-18 NOTE — Patient Instructions (Signed)
Please review discharge instructions.

## 2010-07-21 ENCOUNTER — Encounter: Payer: Self-pay | Admitting: Gastroenterology

## 2010-08-12 NOTE — H&P (Signed)
NAME:  Crystal Thornton, Crystal Thornton NO.:  [id number]   MEDICAL RECORD NO.:  [id number]                   PATIENT TYPE:  EMS   LOCATION:  MAJO                                 FACILITY:  [facility]   PHYSICIAN:  Crystal Thornton, M.D. [facility]           DATE OF BIRTH:  Oct 20, 1945   DATE OF ADMISSION:  08/15/2003  DATE OF DISCHARGE:                                HISTORY & PHYSICAL   CHIEF COMPLAINT:  I don't' feel well.   HISTORY OF PRESENT ILLNESS:  Crystal Thornton is a 65 year old female with a 24-  hour history of illness.  She developed fever yesterday.  Today fever is  associated with myalgias and headaches.  She has developed a cough today  which has been nonproductive.  She denies shortness of breath or wheezing.  She denies any chest pain.  She denies any recent ill contacts.   PAST MEDICAL HISTORY:  1. Significant for hepatitis B.  2. Diet-controlled diabetes.   MEDICATIONS:  None.   ALLERGIES:  NAPROXEN (she thinks all other nonsteroidals as well).   SOCIAL HISTORY:  She lives with her son and grandchild.  She smokes a half  pack per day, does not drink alcohol.  She works as a IT consultant.   REVIEW OF SYSTEMS:  Significant for headache, cough, fever, chills.  She  denies any rash.  She denies any neurologic complaints or deficits.  She  denies any other complaints in review of systems.   PHYSICAL EXAMINATION:  VITAL SIGNS:  Initial temperature 98.9, at the time  of my examination, temperature of 102.  Respirations 15, heart rate 90,  blood pressure 105/70.  Her pulse ox at the time of my examination is 94%.  GENERAL:  She appears as a mildly ill female in no acute distress.  HEENT:  Atraumatic, normocephalic, extraocular muscles are intact.  Oropharynx is moist with poor dentition.  NECK:  Supple without lymphadenopathy, thyromegaly, jugular venous  distention, or carotid bruits.  CHEST:  With significant rhonchi in the left mid lung field with dullness to  percussion and fremitus.  No increased work of breathing.  CARDIAC:  S1 and S2 are normal without murmurs or gallops.  ABDOMEN:  Active bowel sounds, soft, nontender.  There is no  hepatosplenomegaly.  EXTREMITIES:  There is no clubbing, cyanosis, or edema.  DERMATOLOGIC:  No rashes.  GENITOURINARY:  Exam deferred.  BREASTS:  Exam deferred.  NEUROLOGIC:  She is alert and oriented without any motor or sensory  deficits.   LABORATORY DATA:  CBC with white count of 25.7, hemoglobin 15.5, platelet  count 201,000.  CMET is normal except for a potassium of 3.1, glucose 150,  total bilirubin 2.4.   Chest x-ray demonstrates a lingular infiltrate.   ASSESSMENT/PLAN:  1. Community-acquired pneumonia.  Will treat with Rocephin and Zithromax.     Will use albuterol nebulizer p.r.n. and oxygen.  I suspect most of her  systemic symptoms are related  to the pneumonia.  She will need follow-up     chest x-ray to make sure this is a postobstructive process.  2. Headache and fever.  Will treat with pain relievers and antipyretics.                                                Crystal Thornton, M.D. [facility]    BHS/MEDQ  D:  08/15/2003  T:  08/15/2003  Job:  [id number]   cc:   Crystal Thornton, M.D. [facility]

## 2010-08-12 NOTE — Discharge Summary (Signed)
Crystal Thornton, Crystal Thornton               ACCOUNT NO.:  [id number]   MEDICAL RECORD NO.:  [id number]          PATIENT TYPE:  OBV   LOCATION:  4735                         FACILITY:  [facility]   PHYSICIAN:  Rene Paci, M.D. LHCDATE OF BIRTH:  [birth year]   DATE OF ADMISSION:  06/12/2005  DATE OF DISCHARGE:  06/13/2005                                 DISCHARGE SUMMARY   DIAGNOSIS AT DISCHARGE:  Atypical chest pain.   HISTORY OF PRESENT ILLNESS:  The patient is a 65 year old white female who  presented to the emergency department on June 22, 2005, who reported on-and-  off substernal chest pressure since June 10, 2005.  She also noted this to  be in the epigastric area with a sharp stabbing sensation.  The patient was  admitted for further evaluation.   PAST MEDICAL HISTORY:  1.  Hepatitis C, chronic.  2.  Diet-controlled diabetes type 2.  3.  History of left-sided pneumonia.  4.  Left shoulder surgery, May 2005.   COURSE OF HOSPITALIZATION:  PROBLEM #1 - ATYPICAL CHEST PAIN:  The patient  was admitted and underwent serial cardiac enzymes.  She was noted to have 1  troponin with a value of 0.05; however, CK and CK-MB were negative.  The  patient remained stable on telemetry.  A CT angiogram of the chest was  performed which was negative for PE; it did note COPD, but no acute  abnormalities.  In addition, an abdominal ultrasound was performed which was  negative for gallstones.  It did noted mild dilation of the pancreatic duct  without other signs of pancreatitis.  Serum lipase was within normal limits.  At this time, plan is to discharge the patient to home.  She will need an  outpatient Cardiolite to be arranged by her primary care physician.   MEDICATIONS AT DISCHARGE:  None.   FOLLOWUP:  The patient is instructed to follow up with Dr. Oliver Barre in 1-2  weeks and call for an appointment.   DISCHARGE INSTRUCTIONS:  She is instructed also to quit smoking and to  returned to the  emergency room should she develop worsening chest pain.  She  is instructed to call Dr. Jonny Ruiz should she develop fever over 101, nausea or  vomiting.  At her followup appointment, she will need a followup of  hemoglobin A1c, which is pending at time of this dictation, as well as an  outpatient Cardiolite will need to be arranged.      Melissa S. Peggyann Juba, NP      Rene Paci, M.D. [facility]  Electronically Signed    MSO/MEDQ  D:  06/13/2005  T:  06/14/2005  Job:  [id number]   cc:   Corwin Levins, M.D. [facility]  [address]  [place]  [place] [address]

## 2010-08-12 NOTE — Discharge Summary (Signed)
NAMESHANNIE, KONTOS NO.:  [id number]   MEDICAL RECORD NO.:  [id number]                   PATIENT TYPE:  INP   LOCATION:  3002                                 FACILITY:  [facility]   PHYSICIAN:  Rene Paci, M.D. [facility]          DATE OF BIRTH:  28-Oct-1945   DATE OF ADMISSION:  08/15/2003  DATE OF DISCHARGE:  08/17/2003                                 DISCHARGE SUMMARY   DISCHARGE DIAGNOSES:  1. Left lingular pneumonia.  2. Community-acquired pneumonia.  3. Headache.  4. Mild hypokalemia.   BRIEF ADMISSION HISTORY:  Ms. Overturf is a 65 year old white female who  presented with fever, myalgias, headache, and nonproductive cough. Chest x-  ray was consistent with a lingular pneumonia. The patient was admitted for  treatment of community-acquired pneumonia.   PAST MEDICAL HISTORY:  1. Chronic hepatitis C.  2. Adult-onset diabetes mellitus.   HOSPITAL COURSE:  Infectious disease. The patient presented with fever and a  white count of 20,000. The patient was started on Rocephin and Zithromax.  The patient defervesced, and her white count did normalize. The patient has  now been changed to oral antibiotics. The patient was also maintaining  saturations of 92% or greater on room air.   DISCHARGE LABORATORY DATA:  BMET and CBC were normal. Blood cultures were  still pending.   MEDICATIONS AT DISCHARGE:  1. Ceftin 250 mg b.i.d. for an additional 7 days.  2. Zithromax 250 mg q.d. for an additional 2 days.  3. Guaifenesin 600 mg 2 tablets every 12 hours for 7 days.   FOLLOW UP:  With Dr. Jonny Ruiz in one to two weeks.      Cornell Barman, P.A. [facility]                  Rene Paci, M.D. [facility]    LC/MEDQ  D:  08/17/2003  T:  08/18/2003  Job:  [id number]   cc:   Corwin Levins, M.D. [facility]

## 2010-09-02 ENCOUNTER — Other Ambulatory Visit: Payer: Self-pay

## 2010-09-02 MED ORDER — OXYCODONE-ACETAMINOPHEN 7.5-325 MG PO TABS: 1.0000 | ORAL_TABLET | Freq: Three times a day (TID) | ORAL | Status: DC | PRN

## 2010-09-05 NOTE — Telephone Encounter (Signed)
Pt informed, Rx in cabinet for pt pick up  

## 2010-10-10 ENCOUNTER — Telehealth: Payer: Self-pay | Admitting: *Deleted

## 2010-10-10 NOTE — Telephone Encounter (Signed)
Pt requesting refill on Percocet. Would like call when Rx is ready for p/u.

## 2010-10-10 NOTE — Telephone Encounter (Signed)
Sent office notes to pain management as requested by patient.

## 2010-10-10 NOTE — Telephone Encounter (Signed)
Pt is requesting last [3] OV notes be faxed to [facility] [phone]

## 2010-10-11 MED ORDER — OXYCODONE-ACETAMINOPHEN 7.5-325 MG PO TABS: 1.0000 | ORAL_TABLET | Freq: Three times a day (TID) | ORAL | Status: DC | PRN

## 2010-10-11 NOTE — Telephone Encounter (Signed)
Ok for bridge meds

## 2010-10-11 NOTE — Telephone Encounter (Signed)
Patient has appointment w/[facility] on 10/18/2010 @ 09:30am [[phone]] Pt is requesting Rx for Percocet and is asking a call at her work number [phone] when script is ready for p/u.

## 2010-10-11 NOTE — Telephone Encounter (Signed)
See note accidentally closed - percocet rx Done hardcopy

## 2010-10-31 ENCOUNTER — Ambulatory Visit (INDEPENDENT_AMBULATORY_CARE_PROVIDER_SITE_OTHER)
Admission: RE | Admit: 2010-10-31 | Discharge: 2010-10-31 | Disposition: A | Discharge status: Home / Self Care | Payer: BC Managed Care – PPO | Source: Ambulatory Visit | Attending: Internal Medicine | Admitting: Internal Medicine

## 2010-10-31 DIAGNOSIS — M899 Disorder of bone, unspecified: Secondary | ICD-10-CM

## 2010-10-31 DIAGNOSIS — M949 Disorder of cartilage, unspecified: Secondary | ICD-10-CM

## 2010-11-02 ENCOUNTER — Ambulatory Visit (INDEPENDENT_AMBULATORY_CARE_PROVIDER_SITE_OTHER)
Admission: RE | Admit: 2010-11-02 | Discharge: 2010-11-02 | Disposition: A | Discharge status: Home / Self Care | Payer: BC Managed Care – PPO | Source: Ambulatory Visit | Attending: Internal Medicine | Admitting: Internal Medicine

## 2010-11-02 ENCOUNTER — Ambulatory Visit (INDEPENDENT_AMBULATORY_CARE_PROVIDER_SITE_OTHER): Payer: BC Managed Care – PPO | Admitting: Internal Medicine

## 2010-11-02 ENCOUNTER — Encounter: Payer: Self-pay | Admitting: Internal Medicine

## 2010-11-02 VITALS — BP 110/80 | HR 69 | Temp 97.6°F | Ht 67.0 in | Wt 166.1 lb

## 2010-11-02 DIAGNOSIS — R220 Localized swelling, mass and lump, head: Secondary | ICD-10-CM

## 2010-11-02 DIAGNOSIS — R221 Localized swelling, mass and lump, neck: Secondary | ICD-10-CM

## 2010-11-02 DIAGNOSIS — F411 Generalized anxiety disorder: Secondary | ICD-10-CM

## 2010-11-02 DIAGNOSIS — I10 Essential (primary) hypertension: Secondary | ICD-10-CM

## 2010-11-02 DIAGNOSIS — Z0001 Encounter for general adult medical examination with abnormal findings: Secondary | ICD-10-CM | POA: Insufficient documentation

## 2010-11-02 DIAGNOSIS — Z0000 Encounter for general adult medical examination without abnormal findings: Secondary | ICD-10-CM

## 2010-11-02 NOTE — Assessment & Plan Note (Signed)
Rather large lipoma (probable) at the base of left neck,  For ENT referral, also for CXR  - r/o chest involvement underlying in smoker, but less likely with neg ct chest sept 2011

## 2010-11-02 NOTE — Progress Notes (Signed)
Subjective:    Patient ID: Crystal Thornton, female    DOB: 1946-02-27, 65 y.o.   MRN: [id number]  HPI  Here for acute at the behest of her chiropracter who noted some unusual swellling to the left base of the neck in the supraclavicular area, worse in the last 4 wks, no help with neck and shoulder xrays per chiropracter (though did sees some DDD in the c-spine);  Still smoking.  No change in minor nonprod cough she has chronically, and Pt denies chest pain except for "little bit" of pain when she turns the head largely to the right and thought to have some referred pain from the neck to the shoulder per the chiropracted , no increased sob or doe, wheezing, orthopnea, PND, increased LE swelling, palpitations, dizziness or syncope. No hx of lipoma or other malignancy in the past.  Pt denies fever, wt loss, night sweats, loss of appetite, or other constitutional symptoms.  Denies worsening depressive symptoms, suicidal ideation, or panic, though has ongoing anxiety, not increased recently.   Pt denies new neurological symptoms such as new headache, or facial or extremity weakness or numbness.   Pt denies polydipsia, polyuria    Past Medical History  Diagnosis Date  . Depression   . Diabetes mellitus   . Hyperlipidemia   . Hypertension   . Osteopenia   . Anxiety   . Pain     R. shoulder and lower back and legs  . Hepatitis C     Treated and no longer has  . ANGIOEDEMA 01/24/2010  . ANXIETY 11/07/2006  . BACK PAIN 11/01/2007  . Cough 05/29/2008  . CYST, OVARIAN NEC/NOS 11/05/2006  . DEPRESSION 11/07/2006  . DIABETES MELLITUS, TYPE II 11/07/2006  . DIVERTICULOSIS, COLON 11/07/2006  . FIBROCYSTIC BREAST DISEASE 11/05/2006  . HEAD TRAUMA, CLOSED 01/24/2010  . HEPATITIS B, HX OF 11/05/2006  . HEPATITIS C 11/05/2006  . HYPERLIPIDEMIA 11/07/2006  . HYPERTENSION 05/29/2007  . INSOMNIA, HX OF 11/05/2006  . LOW BACK PAIN 05/29/2007  . OSTEOPENIA 11/07/2006  . Other specified sites of sprains and strains 08/31/2008  .  PULMONARY NODULE, LEFT UPPER LOBE 05/31/2009  . SHOULDER PAIN, RIGHT, CHRONIC 06/02/2010  . SINUSITIS- ACUTE-NOS 12/10/2007  . SKIN LESION 06/02/2010  . TOTAL ABDOMINAL HYSTERECTOMY, HX OF 03/27/1990   Past Surgical History  Procedure Date  . Tubal ligation   . Cesarean section   . Shoulder arthroscopy   . Ovarian cyst surgery     ruptured  . Appendectomy   . Colonoscopy   . Liver biopsy     reports that she has been smoking.  She has never used smokeless tobacco. She reports that she does not drink alcohol or use illicit drugs. family history includes Liver cancer in her mother. Allergies  Allergen Reactions  . Naproxen Anaphylaxis  . Naproxen Sodium Anaphylaxis  . Ace Inhibitors     REACTION: lip angioedema  . Sulfonamide Derivatives Other (See Comments)    nephritis   Current Outpatient Prescriptions on File Prior to Visit  Medication Sig Dispense Refill  . alendronate (FOSAMAX) 70 MG tablet Take 70 mg by mouth every 7 (seven) days. Take with a full glass of water on an empty stomach.       . ALPRAZolam (XANAX) 1 MG tablet Take 1 tablet (1 mg total) by mouth 3 (three) times daily as needed for sleep.  90 tablet  5  . aspirin 81 MG EC tablet Take 81 mg by mouth daily.        Marland Kitchen  atenolol (TENORMIN) 50 MG tablet Take 50 mg by mouth daily.        . cyclobenzaprine (FLEXERIL) 5 MG tablet Take 5 mg by mouth as needed. Muscle spasm      . metFORMIN (GLUCOPHAGE) 500 MG tablet Take 2 tablets by mouth Daily.      . Multiple Vitamins-Minerals (CENTRUM SILVER PO) Take 1 tablet by mouth daily.        Marland Kitchen oxyCODONE-acetaminophen (PERCOCET) 7.5-325 MG per tablet Take 1 tablet by mouth every 8 (eight) hours as needed for pain. pain  24 tablet  0  . pravastatin (PRAVACHOL) 40 MG tablet Take 2 tablets by mouth Daily.      . sertraline (ZOLOFT) 100 MG tablet Take 1 tablet by mouth Daily.       Current Facility-Administered Medications on File Prior to Visit  Medication Dose Route Frequency Provider  Last Rate Last Dose  . 0.9 %  sodium chloride infusion  500 mL Intravenous Continuous Meryl Dare, MD,FACG       Review of Systems Review of Systems  Constitutional: Negative for diaphoresis and unexpected weight change.  HENT: Negative for drooling and tinnitus.   Eyes: Negative for photophobia and visual disturbance.  Respiratory: Negative for choking and stridor.   Gastrointestinal: Negative for vomiting and blood in stool.      Objective:   Physical Exam BP 110/80  Pulse 69  Temp(Src) 97.6 F (36.4 C) (Oral)  Ht 5\' 7"  (1.702 m)  Wt 166 lb 2 oz (75.354 kg)  BMI 26.02 kg/m2  SpO2 91% Physical Exam  VS noted Constitutional: Pt appears well-developed and well-nourished.  HENT: Head: Normocephalic.  Right Ear: External ear normal.  Left Ear: External ear normal.  Eyes: Conjunctivae and EOM are normal. Pupils are equal, round, and reactive to light.  Neck: Normal range of motion. Neck supple. rather large nondiscrete fatty type mass noted to the left base of the neck and supraclavicular area at least 4-5 cm in size Cardiovascular: Normal rate and regular rhythm.   Pulmonary/Chest: Effort normal and breath sounds normal.  Neurological: Pt is alert. No cranial nerve deficit.  Skin: Skin is warm. No erythema.  Psychiatric: Pt behavior is normal. Thought content normal. not depressed appearing, 1+ nervous        Assessment & Plan:

## 2010-11-02 NOTE — Patient Instructions (Addendum)
Please go to XRAY in the Basement for the x-ray test Please call the phone number [phone] (the [employer] System) for results of testing in 2-3 days;  When calling, simply dial the number, and when prompted enter the MRN number above (the Medical Record Number) and the # key, then the message should start. You will be contacted regarding the referral for: ENT Please return in 6 mo with Lab testing done 3-5 days before

## 2010-11-05 ENCOUNTER — Encounter: Payer: Self-pay | Admitting: Internal Medicine

## 2010-11-05 NOTE — Assessment & Plan Note (Signed)
stable overall by hx and exam, most recent data reviewed with pt, and pt to continue medical treatment as before  Lab Results  Component Value Date   WBC 6.8 05/27/2010   HGB 14.3 05/27/2010   HCT 41.6 05/27/2010   PLT 256.0 05/27/2010   CHOL 155 05/27/2010   TRIG 105.0 05/27/2010   HDL 49.80 05/27/2010   LDLDIRECT 144.1 04/27/2006   ALT 14 05/27/2010   AST 17 05/27/2010   NA 142 05/27/2010   K 5.0 05/27/2010   CL 108 05/27/2010   CREATININE 0.8 05/27/2010   BUN 18 05/27/2010   CO2 27 05/27/2010   TSH 0.87 05/27/2010   HGBA1C 6.9* 05/27/2010   MICROALBUR 3.4* 05/27/2010

## 2010-11-05 NOTE — Assessment & Plan Note (Signed)
stable overall by hx and exam, most recent data reviewed with pt, and pt to continue medical treatment as before  BP Readings from Last 3 Encounters:  11/02/10 110/80  07/18/10 141/91  06/02/10 112/70

## 2010-11-11 ENCOUNTER — Other Ambulatory Visit: Payer: Self-pay | Admitting: Otolaryngology

## 2010-11-11 DIAGNOSIS — M542 Cervicalgia: Secondary | ICD-10-CM

## 2010-11-14 ENCOUNTER — Other Ambulatory Visit: Payer: Self-pay

## 2010-11-14 MED ORDER — ALENDRONATE SODIUM 70 MG PO TABS: 70.0000 mg | ORAL_TABLET | ORAL | Status: DC

## 2010-11-17 ENCOUNTER — Ambulatory Visit
Admission: RE | Admit: 2010-11-17 | Discharge: 2010-11-17 | Disposition: A | Discharge status: Home / Self Care | Payer: BC Managed Care – PPO | Source: Ambulatory Visit | Attending: Otolaryngology | Admitting: Otolaryngology

## 2010-11-17 DIAGNOSIS — M542 Cervicalgia: Secondary | ICD-10-CM

## 2010-11-17 MED ORDER — GADOBENATE DIMEGLUMINE 529 MG/ML IV SOLN
15.0000 mL | Freq: Once | INTRAVENOUS | Status: AC | PRN
  Administered 2010-11-17: 15 mL via INTRAVENOUS

## 2010-12-02 ENCOUNTER — Ambulatory Visit: Payer: BC Managed Care – PPO | Admitting: Internal Medicine

## 2010-12-13 ENCOUNTER — Other Ambulatory Visit: Payer: Self-pay

## 2010-12-13 DIAGNOSIS — F419 Anxiety disorder, unspecified: Secondary | ICD-10-CM

## 2010-12-13 MED ORDER — ALPRAZOLAM 1 MG PO TABS: 1.0000 mg | ORAL_TABLET | Freq: Three times a day (TID) | ORAL | Status: DC | PRN

## 2010-12-13 MED ORDER — SERTRALINE HCL 100 MG PO TABS: 100.0000 mg | ORAL_TABLET | Freq: Every day | ORAL | Status: DC

## 2010-12-14 NOTE — Telephone Encounter (Signed)
Faxed hardcopy to [facility] [phone]

## 2011-01-04 ENCOUNTER — Ambulatory Visit (INDEPENDENT_AMBULATORY_CARE_PROVIDER_SITE_OTHER): Payer: BC Managed Care – PPO | Admitting: Internal Medicine

## 2011-01-04 ENCOUNTER — Encounter: Payer: Self-pay | Admitting: Internal Medicine

## 2011-01-04 ENCOUNTER — Ambulatory Visit (INDEPENDENT_AMBULATORY_CARE_PROVIDER_SITE_OTHER)
Admission: RE | Admit: 2011-01-04 | Discharge: 2011-01-04 | Disposition: A | Discharge status: Home / Self Care | Payer: BC Managed Care – PPO | Source: Ambulatory Visit | Attending: Internal Medicine | Admitting: Internal Medicine

## 2011-01-04 VITALS — BP 100/62 | HR 62 | Temp 98.3°F | Ht 67.5 in | Wt 163.2 lb

## 2011-01-04 DIAGNOSIS — Z23 Encounter for immunization: Secondary | ICD-10-CM

## 2011-01-04 DIAGNOSIS — R062 Wheezing: Secondary | ICD-10-CM

## 2011-01-04 DIAGNOSIS — R221 Localized swelling, mass and lump, neck: Secondary | ICD-10-CM

## 2011-01-04 DIAGNOSIS — J209 Acute bronchitis, unspecified: Secondary | ICD-10-CM

## 2011-01-04 DIAGNOSIS — E119 Type 2 diabetes mellitus without complications: Secondary | ICD-10-CM

## 2011-01-04 DIAGNOSIS — R220 Localized swelling, mass and lump, head: Secondary | ICD-10-CM

## 2011-01-04 MED ORDER — HYDROCODONE-HOMATROPINE 5-1.5 MG/5ML PO SYRP: 5.0000 mL | ORAL_SOLUTION | Freq: Four times a day (QID) | ORAL | Status: AC | PRN

## 2011-01-04 MED ORDER — PREDNISONE 10 MG PO TABS: 10.0000 mg | ORAL_TABLET | Freq: Every day | ORAL | Status: AC

## 2011-01-04 MED ORDER — LEVOFLOXACIN 500 MG PO TABS: 500.0000 mg | ORAL_TABLET | Freq: Every day | ORAL | Status: AC

## 2011-01-04 NOTE — Patient Instructions (Addendum)
Take all new medications as prescribed - the antibiotic (sent to the pharmacy), cough med and prednisone Continue all other medications as before Please go to XRAY in the Basement for the x-ray test Please remember your sugars will be mildly elevated with the prednisone, but come back to lower levels when the prednisone is done You had the flu shot today

## 2011-01-04 NOTE — Assessment & Plan Note (Signed)
I think c/w lipoma,  to f/u any worsening symptoms or concerns, left neck/supraclavicular area

## 2011-01-04 NOTE — Assessment & Plan Note (Addendum)
Mild to mod, for antibx course,  to f/u any worsening symptoms or concerns, for cxr - r/o pna, also for flu shot today  Note is made, family member recently with MRSA leg abscess now resolved.

## 2011-01-04 NOTE — Progress Notes (Signed)
Subjective:    Patient ID: Crystal Thornton, female    DOB: November 22, 1945, 65 y.o.   MRN: [id number]  HPI  Here with acute onset mild to mod 2-3 days ST, HA, general weakness and malaise, with prod cough greenish sputum, but Pt denies chest pain, increased sob or doe, wheezing, orthopnea, PND, increased LE swelling, palpitations, dizziness or syncope, until last evening and today with new onset worsening wheezing/mild sob as well.  Still denies signficant DOE.   Pt denies polydipsia, polyuria, or low sugar symptoms such as weakness or confusion improved with po intake.  Pt states overall good compliance with meds, trying to follow lower cholesterol, diabetic diet, wt overall stable but little exercise however.     Does have sense of ongoing fatigue, but denies signficant hypersomnolence. Still has the fatty mass to the left lower neck/supraclavicular no change c/w prob lipoma clinically, MRI neg for acute.   Past Medical History  Diagnosis Date  . Depression   . Diabetes mellitus   . Hyperlipidemia   . Hypertension   . Osteopenia   . Anxiety   . Pain     R. shoulder and lower back and legs  . Hepatitis C     Treated and no longer has  . ANGIOEDEMA 01/24/2010  . ANXIETY 11/07/2006  . BACK PAIN 11/01/2007  . Cough 05/29/2008  . CYST, OVARIAN NEC/NOS 11/05/2006  . DEPRESSION 11/07/2006  . DIABETES MELLITUS, TYPE II 11/07/2006  . DIVERTICULOSIS, COLON 11/07/2006  . FIBROCYSTIC BREAST DISEASE 11/05/2006  . HEAD TRAUMA, CLOSED 01/24/2010  . HEPATITIS B, HX OF 11/05/2006  . HEPATITIS C 11/05/2006  . HYPERLIPIDEMIA 11/07/2006  . HYPERTENSION 05/29/2007  . INSOMNIA, HX OF 11/05/2006  . LOW BACK PAIN 05/29/2007  . OSTEOPENIA 11/07/2006  . Other specified sites of sprains and strains 08/31/2008  . PULMONARY NODULE, LEFT UPPER LOBE 05/31/2009  . SHOULDER PAIN, RIGHT, CHRONIC 06/02/2010  . SINUSITIS- ACUTE-NOS 12/10/2007  . SKIN LESION 06/02/2010  . TOTAL ABDOMINAL HYSTERECTOMY, HX OF 03/27/1990   Past Surgical History    Procedure Date  . Tubal ligation   . Cesarean section   . Shoulder arthroscopy   . Ovarian cyst surgery     ruptured  . Appendectomy   . Colonoscopy   . Liver biopsy     reports that she has been smoking.  She has never used smokeless tobacco. She reports that she does not drink alcohol or use illicit drugs. family history includes Liver cancer in her mother. Allergies  Allergen Reactions  . Naproxen Anaphylaxis  . Naproxen Sodium Anaphylaxis  . Ace Inhibitors     REACTION: lip angioedema  . Sulfonamide Derivatives Other (See Comments)    nephritis   Current Outpatient Prescriptions on File Prior to Visit  Medication Sig Dispense Refill  . alendronate (FOSAMAX) 70 MG tablet Take 1 tablet (70 mg total) by mouth every 7 (seven) days. Take with a full glass of water on an empty stomach.  12 tablet  3  . ALPRAZolam (XANAX) 1 MG tablet Take 1 tablet (1 mg total) by mouth 3 (three) times daily as needed for sleep.  90 tablet  5  . aspirin 81 MG EC tablet Take 81 mg by mouth daily.        Marland Kitchen atenolol (TENORMIN) 50 MG tablet Take 50 mg by mouth daily.        . cyclobenzaprine (FLEXERIL) 5 MG tablet Take 5 mg by mouth as needed. Muscle spasm      .  metFORMIN (GLUCOPHAGE) 500 MG tablet Take 2 tablets by mouth Daily.      . Multiple Vitamins-Minerals (CENTRUM SILVER PO) Take 1 tablet by mouth daily.        . pravastatin (PRAVACHOL) 40 MG tablet Take 2 tablets by mouth Daily.      . sertraline (ZOLOFT) 100 MG tablet Take 1 tablet (100 mg total) by mouth daily.  90 tablet  3   Current Facility-Administered Medications on File Prior to Visit  Medication Dose Route Frequency Provider Last Rate Last Dose  . 0.9 %  sodium chloride infusion  500 mL Intravenous Continuous Meryl Dare, MD,FACG       Review of Systems Review of Systems  Constitutional: Negative for diaphoresis and unexpected weight change.  HENT: Negative for drooling and tinnitus.   Eyes: Negative for photophobia and visual  disturbance.  Respiratory: Negative for choking and stridor.   Gastrointestinal: Negative for vomiting and blood in stool.  Genitourinary: Negative for hematuria and decreased urine volume.       Objective:   Physical Exam BP 100/62  Pulse 62  Temp(Src) 98.3 F (36.8 C) (Oral)  Ht 5' 7.5" (1.715 m)  Wt 163 lb 4 oz (74.05 kg)  BMI 25.19 kg/m2  SpO2 93% Physical Exam  VS noted, mild ill appearing Constitutional: Pt appears well-developed and well-nourished.  HENT: Head: Normocephalic.  Right Ear: External ear normal.  Left Ear: External ear normal. Bilat tm's mild erythema.  Sinus nontender.  Pharynx mild erythema  Eyes: Conjunctivae and EOM are normal. Pupils are equal, round, and reactive to light.  Neck: Normal range of motion. Neck supple. Fatty enlargement to left lateral lower neck area no change Cardiovascular: Normal rate and regular rhythm.   Pulmonary/Chest: Effort normal and breath sounds decreased with bilat mild wheezes.  Abd:  Soft, NT, non-distended, + BS Neurological: Pt is alert. No cranial nerve deficit.  Skin: Skin is warm. No erythema.  Psychiatric: Pt behavior is normal. Thought content normal. 1+ nervous    Assessment & Plan:

## 2011-01-05 ENCOUNTER — Encounter: Payer: Self-pay | Admitting: Internal Medicine

## 2011-01-05 NOTE — Assessment & Plan Note (Signed)
stable overall by hx and exam, most recent data reviewed with pt, and pt to continue medical treatment as before  Pt advised to check sugars more frequently,  And call for > 200

## 2011-01-05 NOTE — Assessment & Plan Note (Signed)
Mild to mod, for oral steroid course,  to f/u any worsening symptoms or concerns

## 2011-02-02 ENCOUNTER — Telehealth: Payer: Self-pay

## 2011-02-02 NOTE — Telephone Encounter (Signed)
Patient called requesting chest xray results from 01/04/2011, I informed the patient xray was negative for pna.

## 2011-02-13 ENCOUNTER — Other Ambulatory Visit: Payer: Self-pay

## 2011-02-13 MED ORDER — ATENOLOL 50 MG PO TABS: 50.0000 mg | ORAL_TABLET | Freq: Every day | ORAL | Status: DC

## 2011-03-08 ENCOUNTER — Telehealth: Payer: Self-pay

## 2011-03-08 DIAGNOSIS — G8929 Other chronic pain: Secondary | ICD-10-CM

## 2011-03-08 NOTE — Telephone Encounter (Signed)
Pt called requesting referral to new pain mgmt - [facility] [phone].

## 2011-03-08 NOTE — Telephone Encounter (Signed)
Done per emr 

## 2011-03-17 ENCOUNTER — Telehealth: Payer: Self-pay

## 2011-03-17 NOTE — Telephone Encounter (Signed)
Pt called requesting refill of medication because her pain management MD is out of the office until January 2nd 2013. Please advise.

## 2011-03-17 NOTE — Telephone Encounter (Signed)
The pain MD should have a "backup" way to ensure refills as this is their responsibility  I cannot refill pain medication, b/c then the pain MD very often will not see her further

## 2011-03-17 NOTE — Telephone Encounter (Signed)
Pt informed and understood

## 2011-03-28 HISTORY — PX: CERVICAL FUSION: SHX112

## 2011-05-05 ENCOUNTER — Other Ambulatory Visit (INDEPENDENT_AMBULATORY_CARE_PROVIDER_SITE_OTHER): Payer: BC Managed Care – PPO

## 2011-05-05 DIAGNOSIS — Z0000 Encounter for general adult medical examination without abnormal findings: Secondary | ICD-10-CM

## 2011-05-05 LAB — URINALYSIS, ROUTINE W REFLEX MICROSCOPIC
Nitrite: NEGATIVE
Urobilinogen, UA: 0.2 (ref 0.0–1.0)

## 2011-05-05 LAB — TSH: TSH: 2.18 u[IU]/mL (ref 0.35–5.50)

## 2011-05-05 LAB — HEPATIC FUNCTION PANEL
ALT: 18 U/L (ref 0–35)
Alkaline Phosphatase: 77 U/L (ref 39–117)
Bilirubin, Direct: 0.1 mg/dL (ref 0.0–0.3)
Total Protein: 7.1 g/dL (ref 6.0–8.3)

## 2011-05-05 LAB — BASIC METABOLIC PANEL
CO2: 29 mEq/L (ref 19–32)
Chloride: 106 mEq/L (ref 96–112)
Creatinine, Ser: 0.8 mg/dL (ref 0.4–1.2)
Glucose, Bld: 97 mg/dL (ref 70–99)
Sodium: 141 mEq/L (ref 135–145)

## 2011-05-05 LAB — CBC WITH DIFFERENTIAL/PLATELET
Basophils Absolute: 0 10*3/uL (ref 0.0–0.1)
Eosinophils Absolute: 0.2 10*3/uL (ref 0.0–0.7)
Lymphocytes Relative: 32.4 % (ref 12.0–46.0)
MCHC: 34.3 g/dL (ref 30.0–36.0)
Neutrophils Relative %: 54.6 % (ref 43.0–77.0)
RDW: 13.9 % (ref 11.5–14.6)

## 2011-05-05 LAB — LIPID PANEL
HDL: 61.7 mg/dL (ref >39.00)
Total CHOL/HDL Ratio: 3
VLDL: 13.6 mg/dL (ref 0.0–40.0)

## 2011-05-08 ENCOUNTER — Encounter: Payer: Self-pay | Admitting: Internal Medicine

## 2011-05-08 ENCOUNTER — Ambulatory Visit (INDEPENDENT_AMBULATORY_CARE_PROVIDER_SITE_OTHER): Payer: BC Managed Care – PPO | Admitting: Internal Medicine

## 2011-05-08 VITALS — BP 112/78 | HR 66 | Temp 97.0°F | Ht 67.0 in | Wt 167.0 lb

## 2011-05-08 DIAGNOSIS — Z0000 Encounter for general adult medical examination without abnormal findings: Secondary | ICD-10-CM

## 2011-05-08 DIAGNOSIS — E119 Type 2 diabetes mellitus without complications: Secondary | ICD-10-CM

## 2011-05-08 NOTE — Assessment & Plan Note (Signed)
Overall doing well, age appropriate education and counseling updated, referrals for preventative services and immunizations addressed, dietary and smoking counseling addressed, most recent labs and ECG reviewed.  I have personally reviewed and have noted: 1) the patient's medical and social history 2) The pt's use of alcohol, tobacco, and illicit drugs 3) The patient's current medications and supplements 4) Functional ability including ADL's, fall risk, home safety risk, hearing and visual impairment 5) Diet and physical activities 6) Evidence for depression or mood disorder 7) The patient's height, weight, and BMI have been recorded in the chart I have made referrals, and provided counseling and education based on review of the above For f/u labs and eval 1 yr

## 2011-05-08 NOTE — Patient Instructions (Signed)
Please check with your pharmacy to see if you have been on the fosamax for total 5 yr;  If so, ok to stop at this time. You are due for next Bone Density in Sept 2014 Please remember to followup with your GYN for the yearly pap smear and/or mammogram Please stop smoking You are given the copies of your CXR, MRI, and labs today Please keep your appointments with your specialists as you have planned - Dr Gerlene Fee later today, and Dr Metta Clines for pain management Please return in 1 year for your yearly visit, or sooner if needed, with Lab testing done 3-5 days before

## 2011-05-08 NOTE — Progress Notes (Signed)
Subjective:    Patient ID: Crystal Thornton, female    DOB: 1945-12-12, 66 y.o.   MRN: [id number]  HPI  Here for wellness and f/u;  Overall doing ok;  Pt denies CP, worsening SOB, DOE, wheezing, orthopnea, PND, worsening LE edema, palpitations, dizziness or syncope.  Pt denies neurological change such as new Headache, facial or extremity weakness.  Pt denies polydipsia, polyuria, or low sugar symptoms. Pt states overall good compliance with treatment and medications, good tolerability, and trying to follow lower cholesterol diet.  Pt denies worsening depressive symptoms, suicidal ideation or panic. No fever, wt loss, night sweats, loss of appetite, or other constitutional symptoms.  Pt states good ability with ADL's, low fall risk, home safety reviewed and adequate, no significant changes in hearing or vision, and occasionally active with exercise. Does have appt with Dr Kritzer/NS later this am, recently place on nsaid, for f/u neck pain. recnet MRI supraclavicular area with fatty tissue only 2012,  MRI c-spine per pt with DJD, DDD right more than left (despite more pain on the left) Past Medical History  Diagnosis Date  . Depression   . Diabetes mellitus   . Hyperlipidemia   . Hypertension   . Osteopenia   . Anxiety   . Pain     R. shoulder and lower back and legs  . Hepatitis C     Treated and no longer has  . ANGIOEDEMA 01/24/2010  . ANXIETY 11/07/2006  . BACK PAIN 11/01/2007  . Cough 05/29/2008  . CYST, OVARIAN NEC/NOS 11/05/2006  . DEPRESSION 11/07/2006  . DIABETES MELLITUS, TYPE II 11/07/2006  . DIVERTICULOSIS, COLON 11/07/2006  . FIBROCYSTIC BREAST DISEASE 11/05/2006  . HEAD TRAUMA, CLOSED 01/24/2010  . HEPATITIS B, HX OF 11/05/2006  . HEPATITIS C 11/05/2006  . HYPERLIPIDEMIA 11/07/2006  . HYPERTENSION 05/29/2007  . INSOMNIA, HX OF 11/05/2006  . LOW BACK PAIN 05/29/2007  . OSTEOPENIA 11/07/2006  . Other specified sites of sprains and strains 08/31/2008  . PULMONARY NODULE, LEFT UPPER LOBE  05/31/2009  . SHOULDER PAIN, RIGHT, CHRONIC 06/02/2010  . SINUSITIS- ACUTE-NOS 12/10/2007  . SKIN LESION 06/02/2010  . TOTAL ABDOMINAL HYSTERECTOMY, HX OF 03/27/1990   Past Surgical History  Procedure Date  . Tubal ligation   . Cesarean section   . Shoulder arthroscopy   . Ovarian cyst surgery     ruptured  . Appendectomy   . Colonoscopy   . Liver biopsy     reports that she has been smoking.  She has never used smokeless tobacco. She reports that she does not drink alcohol or use illicit drugs. family history includes Liver cancer in her mother. Allergies  Allergen Reactions  . Naproxen Anaphylaxis  . Naproxen Sodium Anaphylaxis  . Ace Inhibitors     REACTION: lip angioedema  . Sulfonamide Derivatives Other (See Comments)    nephritis   Current Outpatient Prescriptions on File Prior to Visit  Medication Sig Dispense Refill  . alendronate (FOSAMAX) 70 MG tablet Take 1 tablet (70 mg total) by mouth every 7 (seven) days. Take with a full glass of water on an empty stomach.  12 tablet  3  . ALPRAZolam (XANAX) 1 MG tablet Take 1 tablet (1 mg total) by mouth 3 (three) times daily as needed for sleep.  90 tablet  5  . aspirin 81 MG EC tablet Take 81 mg by mouth daily.        Marland Kitchen atenolol (TENORMIN) 50 MG tablet Take 1 tablet (50 mg total)  by mouth daily.  30 tablet  11  . cyclobenzaprine (FLEXERIL) 5 MG tablet Take 5 mg by mouth as needed. Muscle spasm      . metFORMIN (GLUCOPHAGE) 500 MG tablet Take 2 tablets by mouth Daily.      . Multiple Vitamins-Minerals (CENTRUM SILVER PO) Take 1 tablet by mouth daily.        . pravastatin (PRAVACHOL) 40 MG tablet Take 2 tablets by mouth Daily.      . sertraline (ZOLOFT) 100 MG tablet Take 1 tablet (100 mg total) by mouth daily.  90 tablet  3  . oxyCODONE (ROXICODONE) 15 MG immediate release tablet Take 15 mg by mouth every 4 (four) hours as needed.         Current Facility-Administered Medications on File Prior to Visit  Medication Dose Route  Frequency Provider Last Rate Last Dose  . 0.9 %  sodium chloride infusion  500 mL Intravenous Continuous Eliezer Bottom., MD,FACG       Review of Systems Review of Systems  Constitutional: Negative for diaphoresis, activity change, appetite change and unexpected weight change.  HENT: Negative for hearing loss, ear pain, facial swelling, mouth sores and neck stiffness.   Eyes: Negative for pain, redness and visual disturbance.  Respiratory: Negative for shortness of breath and wheezing.   Cardiovascular: Negative for chest pain and palpitations.  Gastrointestinal: Negative for diarrhea, blood in stool, abdominal distention and rectal pain.  Genitourinary: Negative for hematuria, flank pain and decreased urine volume.  Musculoskeletal: Negative for myalgias and joint swelling.  Skin: Negative for color change and wound.  Neurological: Negative for syncope and numbness.  Hematological: Negative for adenopathy.  Psychiatric/Behavioral: Negative for hallucinations, self-injury, decreased concentration and agitation.      Objective:   Physical Exam BP 112/78  Pulse 66  Temp(Src) 97 F (36.1 C) (Oral)  Ht 5\' 7"  (1.702 m)  Wt 167 lb (75.751 kg)  BMI 26.16 kg/m2  SpO2 97% Physical Exam  VS noted Constitutional: Pt is oriented to person, place, and time. Appears well-developed and well-nourished.  HENT:  Head: Normocephalic and atraumatic.  Right Ear: External ear normal.  Left Ear: External ear normal.  Nose: Nose normal.  Mouth/Throat: Oropharynx is clear and moist.  Eyes: Conjunctivae and EOM are normal. Pupils are equal, round, and reactive to light.  Neck: Normal range of motion. Neck supple. No JVD present. No tracheal deviation present.  Cardiovascular: Normal rate, regular rhythm, normal heart sounds and intact distal pulses.   Pulmonary/Chest: Effort normal and breath sounds normal.  Abdominal: Soft. Bowel sounds are normal. There is no tenderness.  Musculoskeletal:  Normal range of motion. Exhibits no edema.  Lymphadenopathy:  Has no cervical adenopathy.  Neurological: Pt is alert and oriented to person, place, and time. Pt has normal reflexes. No cranial nerve deficit.  Skin: Skin is warm and dry. No rash noted.  Psychiatric:  Has  normal mood and affect. Behavior is normal. 1+ nervous    Assessment & Plan:

## 2011-05-08 NOTE — Assessment & Plan Note (Signed)
stable overall by hx and exam, most recent data reviewed with pt, and pt to continue medical treatment as before ble  Lab Results  Component Value Date   WBC 8.8 05/05/2011   HGB 14.7 05/05/2011   HCT 42.7 05/05/2011   PLT 234.0 05/05/2011   GLUCOSE 97 05/05/2011   CHOL 167 05/05/2011   TRIG 68.0 05/05/2011   HDL 61.70 05/05/2011   LDLDIRECT 144.1 04/27/2006   LDLCALC 92 05/05/2011   ALT 18 05/05/2011   AST 15 05/05/2011   NA 141 05/05/2011   K 4.3 05/05/2011   CL 106 05/05/2011   CREATININE 0.8 05/05/2011   BUN 20 05/05/2011   CO2 29 05/05/2011   TSH 2.18 05/05/2011   HGBA1C 6.9* 05/27/2010   MICROALBUR 3.4* 05/27/2010

## 2011-06-05 ENCOUNTER — Ambulatory Visit
Admission: RE | Admit: 2011-06-05 | Discharge: 2011-06-05 | Disposition: A | Discharge status: Home / Self Care | Payer: BC Managed Care – PPO | Source: Ambulatory Visit | Attending: Neurosurgery | Admitting: Neurosurgery

## 2011-06-05 ENCOUNTER — Other Ambulatory Visit: Payer: Self-pay | Admitting: Neurosurgery

## 2011-06-05 DIAGNOSIS — M5020 Other cervical disc displacement, unspecified cervical region: Secondary | ICD-10-CM

## 2011-06-08 ENCOUNTER — Other Ambulatory Visit: Payer: Self-pay

## 2011-06-08 DIAGNOSIS — F419 Anxiety disorder, unspecified: Secondary | ICD-10-CM

## 2011-06-08 MED ORDER — ALPRAZOLAM 1 MG PO TABS: 1.0000 mg | ORAL_TABLET | Freq: Three times a day (TID) | ORAL | Status: DC | PRN

## 2011-06-08 NOTE — Telephone Encounter (Signed)
Faxed hardcopy to pharmacy. 

## 2011-06-08 NOTE — Telephone Encounter (Signed)
Done hardcopy to [facility]  

## 2011-06-19 ENCOUNTER — Other Ambulatory Visit: Payer: Self-pay

## 2011-06-19 MED ORDER — PRAVASTATIN SODIUM 40 MG PO TABS: ORAL_TABLET | ORAL | Status: DC

## 2011-06-19 MED ORDER — METFORMIN HCL 500 MG PO TABS: 1000.0000 mg | ORAL_TABLET | Freq: Every day | ORAL | Status: DC

## 2011-10-25 ENCOUNTER — Other Ambulatory Visit: Payer: Self-pay | Admitting: Internal Medicine

## 2011-11-22 ENCOUNTER — Other Ambulatory Visit: Payer: Self-pay | Admitting: Internal Medicine

## 2011-11-22 NOTE — Telephone Encounter (Signed)
Xanax too soon 

## 2011-11-24 ENCOUNTER — Other Ambulatory Visit: Payer: Self-pay | Admitting: Internal Medicine

## 2011-11-24 ENCOUNTER — Telehealth: Payer: Self-pay | Admitting: Internal Medicine

## 2011-11-24 NOTE — Telephone Encounter (Signed)
Caller: Tenessa/Patient; Patient Name: Crystal Thornton; PCP: Oliver Barre (Adults only); Best Callback Phone Number: [phone] States pharmacy has  sent request for refill for Alprazolam 8-28 but has not received response.ls competely out of medicine 8-30 and states is "not having a good day". Has had to use Xanax more than 3 times per day during past month due to family problems. Wants medicine clled in 8-30. Denies suicidal thoughts/tendencies. States feels overwhelmed unless takes medicine.  Declines appointment for 8-31. PLEASE CALL PATIENT AND ADVISE IF CAN CALL IN ALPRAZOLAM EARLY.

## 2011-11-24 NOTE — Telephone Encounter (Signed)
Faxed hardcopy to [facility] 

## 2011-11-24 NOTE — Telephone Encounter (Signed)
Done hardcopy to [facility]  

## 2011-11-24 NOTE — Telephone Encounter (Signed)
Sorry, I cannot authorize increased use of alprazolam more then prescribed, and she is not due for refill until at least sept 5, based on the previous rx;  Use more than prescribed leads to dependence, tolerance, and withdrawal if stopped suddenly

## 2011-11-28 NOTE — Telephone Encounter (Signed)
Called the patient left message to call back 

## 2011-11-28 NOTE — Telephone Encounter (Signed)
Called the patient informed of MD instructions on medication refill.

## 2011-12-25 ENCOUNTER — Telehealth: Payer: Self-pay

## 2011-12-25 MED ORDER — SERTRALINE HCL 100 MG PO TABS: 100.0000 mg | ORAL_TABLET | Freq: Every day | ORAL | Status: DC

## 2011-12-25 NOTE — Telephone Encounter (Signed)
Refill request

## 2012-02-29 ENCOUNTER — Other Ambulatory Visit: Payer: Self-pay

## 2012-02-29 MED ORDER — ATENOLOL 50 MG PO TABS: 50.0000 mg | ORAL_TABLET | Freq: Every day | ORAL | Status: DC

## 2012-03-21 ENCOUNTER — Other Ambulatory Visit: Payer: Self-pay | Admitting: *Deleted

## 2012-03-21 MED ORDER — SERTRALINE HCL 100 MG PO TABS: 100.0000 mg | ORAL_TABLET | Freq: Every day | ORAL | Status: DC

## 2012-05-20 ENCOUNTER — Other Ambulatory Visit: Payer: Self-pay

## 2012-05-20 MED ORDER — ALPRAZOLAM 1 MG PO TABS: 1.0000 mg | ORAL_TABLET | Freq: Three times a day (TID) | ORAL | Status: DC | PRN

## 2012-05-20 NOTE — Telephone Encounter (Signed)
Faxed hardcopy to pharmacy. 

## 2012-05-20 NOTE — Telephone Encounter (Signed)
Done hardcopy to [facility]  

## 2012-06-10 ENCOUNTER — Encounter: Payer: Self-pay | Admitting: Internal Medicine

## 2012-06-10 ENCOUNTER — Ambulatory Visit (INDEPENDENT_AMBULATORY_CARE_PROVIDER_SITE_OTHER): Payer: Medicare Other | Admitting: Internal Medicine

## 2012-06-10 ENCOUNTER — Other Ambulatory Visit (INDEPENDENT_AMBULATORY_CARE_PROVIDER_SITE_OTHER): Payer: Medicare Other

## 2012-06-10 VITALS — BP 110/72 | HR 63 | Temp 97.2°F | Ht 67.0 in | Wt 163.0 lb

## 2012-06-10 DIAGNOSIS — E785 Hyperlipidemia, unspecified: Secondary | ICD-10-CM

## 2012-06-10 DIAGNOSIS — Z0000 Encounter for general adult medical examination without abnormal findings: Secondary | ICD-10-CM

## 2012-06-10 DIAGNOSIS — Z136 Encounter for screening for cardiovascular disorders: Secondary | ICD-10-CM

## 2012-06-10 DIAGNOSIS — I10 Essential (primary) hypertension: Secondary | ICD-10-CM

## 2012-06-10 DIAGNOSIS — E119 Type 2 diabetes mellitus without complications: Secondary | ICD-10-CM

## 2012-06-10 LAB — TSH: TSH: 0.4 u[IU]/mL (ref 0.35–5.50)

## 2012-06-10 LAB — BASIC METABOLIC PANEL
CO2: 28 mEq/L (ref 19–32)
Calcium: 9.5 mg/dL (ref 8.4–10.5)
Chloride: 103 mEq/L (ref 96–112)
Glucose, Bld: 118 mg/dL — ABNORMAL HIGH (ref 70–99)
Sodium: 139 mEq/L (ref 135–145)

## 2012-06-10 LAB — HEPATIC FUNCTION PANEL
ALT: 17 U/L (ref 0–35)
Albumin: 4 g/dL (ref 3.5–5.2)
Alkaline Phosphatase: 87 U/L (ref 39–117)
Total Protein: 7.4 g/dL (ref 6.0–8.3)

## 2012-06-10 LAB — URINALYSIS, ROUTINE W REFLEX MICROSCOPIC
Hgb urine dipstick: NEGATIVE
Leukocytes, UA: NEGATIVE
Specific Gravity, Urine: 1.025 (ref 1.000–1.030)
Urobilinogen, UA: 0.2 (ref 0.0–1.0)

## 2012-06-10 LAB — CBC WITH DIFFERENTIAL/PLATELET
Basophils Absolute: 0 10*3/uL (ref 0.0–0.1)
Eosinophils Absolute: 0 10*3/uL (ref 0.0–0.7)
Hemoglobin: 14.3 g/dL (ref 12.0–15.0)
Lymphocytes Relative: 21.4 % (ref 12.0–46.0)
Monocytes Relative: 6.2 % (ref 3.0–12.0)
Neutrophils Relative %: 71.7 % (ref 43.0–77.0)
Platelets: 259 10*3/uL (ref 150.0–400.0)
RDW: 13.5 % (ref 11.5–14.6)

## 2012-06-10 LAB — LIPID PANEL
HDL: 48.1 mg/dL (ref >39.00)
Triglycerides: 62 mg/dL (ref 0.0–149.0)

## 2012-06-10 LAB — MICROALBUMIN / CREATININE URINE RATIO: Microalb Creat Ratio: 1 mg/g (ref 0.0–30.0)

## 2012-06-10 LAB — HEMOGLOBIN A1C: Hgb A1c MFr Bld: 6.4 % (ref 4.6–6.5)

## 2012-06-10 MED ORDER — PRAVASTATIN SODIUM 40 MG PO TABS: ORAL_TABLET | ORAL | Status: DC

## 2012-06-10 MED ORDER — TIZANIDINE HCL 2 MG PO TABS: ORAL_TABLET | ORAL | Status: DC

## 2012-06-10 MED ORDER — ATENOLOL 50 MG PO TABS: 50.0000 mg | ORAL_TABLET | Freq: Every day | ORAL | Status: DC

## 2012-06-10 MED ORDER — ALPRAZOLAM 1 MG PO TABS: 1.0000 mg | ORAL_TABLET | Freq: Three times a day (TID) | ORAL | Status: DC | PRN

## 2012-06-10 MED ORDER — SERTRALINE HCL 100 MG PO TABS: 100.0000 mg | ORAL_TABLET | Freq: Every day | ORAL | Status: DC

## 2012-06-10 MED ORDER — METFORMIN HCL 500 MG PO TABS: 1000.0000 mg | ORAL_TABLET | Freq: Every day | ORAL | Status: DC

## 2012-06-10 NOTE — Progress Notes (Signed)
Subjective:    Patient ID: Crystal Thornton, female    DOB: 1945-09-05, 67 y.o.   MRN: [id number]  HPI  Here for wellness and f/u;  Overall doing ok;  Pt denies CP, worsening SOB, DOE, wheezing, orthopnea, PND, worsening LE edema, palpitations, dizziness or syncope.  Pt denies neurological change such as new headache, facial or extremity weakness.  Pt denies polydipsia, polyuria, or low sugar symptoms. Pt states overall good compliance with treatment and medications, good tolerability, and has been trying to follow lower cholesterol diet.  Pt denies worsening depressive symptoms, suicidal ideation or panic. No fever, night sweats, wt loss, loss of appetite, or other constitutional symptoms.  Pt states good ability with ADL's, has low fall risk, home safety reviewed and adequate, no other significant changes in hearing or vision, and only occasionally active with exercise.  No acute complaints need med refills Past Medical History  Diagnosis Date  . Depression   . Diabetes mellitus   . Hyperlipidemia   . Hypertension   . Osteopenia   . Anxiety   . Pain     R. shoulder and lower back and legs  . Hepatitis C     Treated and no longer has  . ANGIOEDEMA 01/24/2010  . ANXIETY 11/07/2006  . BACK PAIN 11/01/2007  . Cough 05/29/2008  . CYST, OVARIAN NEC/NOS 11/05/2006  . DEPRESSION 11/07/2006  . DIABETES MELLITUS, TYPE II 11/07/2006  . DIVERTICULOSIS, COLON 11/07/2006  . FIBROCYSTIC BREAST DISEASE 11/05/2006  . HEAD TRAUMA, CLOSED 01/24/2010  . HEPATITIS B, HX OF 11/05/2006  . HEPATITIS C 11/05/2006  . HYPERLIPIDEMIA 11/07/2006  . HYPERTENSION 05/29/2007  . INSOMNIA, HX OF 11/05/2006  . LOW BACK PAIN 05/29/2007  . OSTEOPENIA 11/07/2006  . Other specified sites of sprains and strains 08/31/2008  . PULMONARY NODULE, LEFT UPPER LOBE 05/31/2009  . SHOULDER PAIN, RIGHT, CHRONIC 06/02/2010  . SINUSITIS- ACUTE-NOS 12/10/2007  . SKIN LESION 06/02/2010  . TOTAL ABDOMINAL HYSTERECTOMY, HX OF 03/27/1990   Past Surgical History   Procedure Laterality Date  . Tubal ligation    . Cesarean section    . Shoulder arthroscopy    . Ovarian cyst surgery      ruptured  . Appendectomy    . Colonoscopy    . Liver biopsy      reports that she has been smoking.  She has never used smokeless tobacco. She reports that she does not drink alcohol or use illicit drugs. family history includes Liver cancer in her mother. Allergies  Allergen Reactions  . Naproxen Anaphylaxis  . Naproxen Sodium Anaphylaxis  . Ace Inhibitors     REACTION: lip angioedema  . Sulfonamide Derivatives Other (See Comments)    nephritis   Current Outpatient Prescriptions on File Prior to Visit  Medication Sig Dispense Refill  . aspirin 81 MG EC tablet Take 81 mg by mouth daily.        . Multiple Vitamins-Minerals (CENTRUM SILVER PO) Take 1 tablet by mouth daily.        Marland Kitchen oxycodone (ROXICODONE) 30 MG immediate release tablet 1/2 by mouth AM, 1/2 by mouth at lunch, 1 by mouth at diner and 1 by mouth at bedtime.       No current facility-administered medications on file prior to visit.   Review of Systems Constitutional: Negative for diaphoresis, activity change, appetite change or unexpected weight change.  HENT: Negative for hearing loss, ear pain, facial swelling, mouth sores and neck stiffness.   Eyes: Negative for  pain, redness and visual disturbance.  Respiratory: Negative for shortness of breath and wheezing.   Cardiovascular: Negative for chest pain and palpitations.  Gastrointestinal: Negative for diarrhea, blood in stool, abdominal distention or other pain Genitourinary: Negative for hematuria, flank pain or change in urine volume.  Musculoskeletal: Negative for myalgias and joint swelling.  Skin: Negative for color change and wound.  Neurological: Negative for syncope and numbness. other than noted Hematological: Negative for adenopathy.  Psychiatric/Behavioral: Negative for hallucinations, self-injury, decreased concentration and  agitation.      Objective:   Physical Exam BP 110/72  Pulse 63  Temp(Src) 97.2 F (36.2 C) (Oral)  Ht 5\' 7"  (1.702 m)  Wt 163 lb (73.936 kg)  BMI 25.52 kg/m2  SpO2 94% VS noted,  Constitutional: Pt is oriented to person, place, and time. Appears well-developed and well-nourished.  Head: Normocephalic and atraumatic.  Right Ear: External ear normal.  Left Ear: External ear normal.  Nose: Nose normal.  Mouth/Throat: Oropharynx is clear and moist.  Eyes: Conjunctivae and EOM are normal. Pupils are equal, round, and reactive to light.  Neck: Normal range of motion. Neck supple. No JVD present. No tracheal deviation present.  Cardiovascular: Normal rate, regular rhythm, normal heart sounds and intact distal pulses.   Pulmonary/Chest: Effort normal and breath sounds normal.  Abdominal: Soft. Bowel sounds are normal. There is no tenderness. No HSM  Musculoskeletal: Normal range of motion. Exhibits no edema.  Lymphadenopathy:  Has no cervical adenopathy.  Neurological: Pt is alert and oriented to person, place, and time. Pt has normal reflexes. No cranial nerve deficit.  Skin: Skin is warm and dry. No rash noted.  Psychiatric:  Has  normal mood and affect. Behavior is normal.     Assessment & Plan:

## 2012-06-10 NOTE — Assessment & Plan Note (Signed)

## 2012-06-10 NOTE — Patient Instructions (Addendum)
Please continue all other medications as before, and refills have been done if requested. The xanax was given in the hardcopy Please remember to followup with your GYN for the yearly pap smear and/or mammogram You are otherwise up to date with prevention measures today. Please go to the LAB in the Basement (turn left off the elevator) for the tests to be done today You will be contacted by phone if any changes need to be made immediately.  Otherwise, you will receive a letter about your results with an explanation, but please check with MyChart first. Thank you for enrolling in MyChart. Please follow the instructions below to securely access your online medical record. MyChart allows you to send messages to your doctor, view your test results, renew your prescriptions, schedule appointments, and more. To Log into My Chart online, please go by [employer] or [employer] to [email or web address], or download the MyChart App from the [employer] .  Your Username is: [email or web address]  (pass [id number]) Please return in 6 months, or sooner if needed, with Lab testing done 3-5 days before

## 2012-06-10 NOTE — Assessment & Plan Note (Signed)
stable overall by history and exam, recent data reviewed with pt, and pt to continue medical treatment as before,  to f/u any worsening symptoms or concerns Lab Results  Component Value Date   HGBA1C 6.4 06/10/2012    

## 2012-10-10 ENCOUNTER — Other Ambulatory Visit: Payer: Self-pay

## 2012-10-10 MED ORDER — ALPRAZOLAM 1 MG PO TABS: 1.0000 mg | ORAL_TABLET | Freq: Three times a day (TID) | ORAL | Status: DC | PRN

## 2012-10-10 NOTE — Telephone Encounter (Signed)
Faxed hardcopy to [facility] 

## 2012-10-10 NOTE — Telephone Encounter (Signed)
Done hardcopy to [facility]  

## 2012-10-30 ENCOUNTER — Other Ambulatory Visit: Payer: Self-pay

## 2012-11-28 ENCOUNTER — Emergency Department (HOSPITAL_COMMUNITY): Payer: Medicare Other

## 2012-11-28 ENCOUNTER — Encounter (HOSPITAL_COMMUNITY): Payer: Self-pay | Admitting: Adult Health

## 2012-11-28 ENCOUNTER — Emergency Department (HOSPITAL_COMMUNITY)
Admission: EM | Admit: 2012-11-28 | Discharge: 2012-11-29 | Disposition: A | Discharge status: Home / Self Care | Payer: Medicare Other | Attending: Emergency Medicine | Admitting: Emergency Medicine

## 2012-11-28 DIAGNOSIS — W010XXA Fall on same level from slipping, tripping and stumbling without subsequent striking against object, initial encounter: Secondary | ICD-10-CM | POA: Insufficient documentation

## 2012-11-28 DIAGNOSIS — F411 Generalized anxiety disorder: Secondary | ICD-10-CM | POA: Insufficient documentation

## 2012-11-28 DIAGNOSIS — Y92009 Unspecified place in unspecified non-institutional (private) residence as the place of occurrence of the external cause: Secondary | ICD-10-CM | POA: Insufficient documentation

## 2012-11-28 DIAGNOSIS — Z8639 Personal history of other endocrine, nutritional and metabolic disease: Secondary | ICD-10-CM | POA: Insufficient documentation

## 2012-11-28 DIAGNOSIS — I10 Essential (primary) hypertension: Secondary | ICD-10-CM | POA: Insufficient documentation

## 2012-11-28 DIAGNOSIS — Z8719 Personal history of other diseases of the digestive system: Secondary | ICD-10-CM | POA: Insufficient documentation

## 2012-11-28 DIAGNOSIS — F329 Major depressive disorder, single episode, unspecified: Secondary | ICD-10-CM | POA: Insufficient documentation

## 2012-11-28 DIAGNOSIS — Z90710 Acquired absence of both cervix and uterus: Secondary | ICD-10-CM | POA: Insufficient documentation

## 2012-11-28 DIAGNOSIS — M25519 Pain in unspecified shoulder: Secondary | ICD-10-CM | POA: Insufficient documentation

## 2012-11-28 DIAGNOSIS — Z8669 Personal history of other diseases of the nervous system and sense organs: Secondary | ICD-10-CM | POA: Insufficient documentation

## 2012-11-28 DIAGNOSIS — G8929 Other chronic pain: Secondary | ICD-10-CM | POA: Insufficient documentation

## 2012-11-28 DIAGNOSIS — E119 Type 2 diabetes mellitus without complications: Secondary | ICD-10-CM | POA: Insufficient documentation

## 2012-11-28 DIAGNOSIS — Z8619 Personal history of other infectious and parasitic diseases: Secondary | ICD-10-CM | POA: Insufficient documentation

## 2012-11-28 DIAGNOSIS — Y9301 Activity, walking, marching and hiking: Secondary | ICD-10-CM | POA: Insufficient documentation

## 2012-11-28 DIAGNOSIS — Z79899 Other long term (current) drug therapy: Secondary | ICD-10-CM | POA: Insufficient documentation

## 2012-11-28 DIAGNOSIS — F3289 Other specified depressive episodes: Secondary | ICD-10-CM | POA: Insufficient documentation

## 2012-11-28 DIAGNOSIS — Z8742 Personal history of other diseases of the female genital tract: Secondary | ICD-10-CM | POA: Insufficient documentation

## 2012-11-28 DIAGNOSIS — F17200 Nicotine dependence, unspecified, uncomplicated: Secondary | ICD-10-CM | POA: Insufficient documentation

## 2012-11-28 DIAGNOSIS — Z8709 Personal history of other diseases of the respiratory system: Secondary | ICD-10-CM | POA: Insufficient documentation

## 2012-11-28 DIAGNOSIS — S82852A Displaced trimalleolar fracture of left lower leg, initial encounter for closed fracture: Secondary | ICD-10-CM

## 2012-11-28 DIAGNOSIS — Z8739 Personal history of other diseases of the musculoskeletal system and connective tissue: Secondary | ICD-10-CM | POA: Insufficient documentation

## 2012-11-28 DIAGNOSIS — Z872 Personal history of diseases of the skin and subcutaneous tissue: Secondary | ICD-10-CM | POA: Insufficient documentation

## 2012-11-28 DIAGNOSIS — Z87828 Personal history of other (healed) physical injury and trauma: Secondary | ICD-10-CM | POA: Insufficient documentation

## 2012-11-28 DIAGNOSIS — S82853A Displaced trimalleolar fracture of unspecified lower leg, initial encounter for closed fracture: Secondary | ICD-10-CM | POA: Insufficient documentation

## 2012-11-28 DIAGNOSIS — Z862 Personal history of diseases of the blood and blood-forming organs and certain disorders involving the immune mechanism: Secondary | ICD-10-CM | POA: Insufficient documentation

## 2012-11-28 MED ORDER — SODIUM CHLORIDE 0.9 % IV BOLUS (SEPSIS)
1000.0000 mL | Freq: Once | INTRAVENOUS | Status: AC
  Administered 2012-11-28: 1000 mL via INTRAVENOUS

## 2012-11-28 MED ORDER — ETOMIDATE 2 MG/ML IV SOLN
11.0000 mg | Freq: Once | INTRAVENOUS | Status: AC
  Administered 2012-11-29: 11 mg via INTRAVENOUS
  Filled 2012-11-28: qty 10

## 2012-11-28 MED ORDER — FENTANYL CITRATE 0.05 MG/ML IJ SOLN
100.0000 ug | INTRAMUSCULAR | Status: AC | PRN
  Administered 2012-11-28 – 2012-11-29 (×3): 100 ug via INTRAVENOUS
  Filled 2012-11-28 (×3): qty 2

## 2012-11-28 MED ORDER — ONDANSETRON HCL 4 MG/2ML IJ SOLN
4.0000 mg | Freq: Once | INTRAMUSCULAR | Status: AC
  Administered 2012-11-28: 4 mg via INTRAVENOUS
  Filled 2012-11-28: qty 2

## 2012-11-28 NOTE — ED Provider Notes (Addendum)
CSN: [id number]     Arrival date & time 11/28/12  2115 History   First MD Initiated Contact with Patient 11/28/12 2148     Chief Complaint  Patient presents with  . Ankle Injury   (Consider location/radiation/quality/duration/timing/severity/associated sxs/prior Treatment) HPI Comments: Crystal Thornton is a [name] who states that she tripped on a rug while walking in her home. She fell, injuring her left ankle. There were no other injuries. She is unable to get up and walk after the fall because of left ankle pain. She got a friend to bring her here for evaluation. She denies head injury neck injury back injury, chest pain, abdominal pain, weakness, or dizziness. There were no preceding symptoms. There has been no recent illness. The pain in her left ankle is worse with palpation, and movement. There are no other known modifying factors.   Patient is a 67 y.o. female presenting with lower extremity injury. The history is provided by the patient.  Ankle Injury    Past Medical History  Diagnosis Date  . Depression   . Diabetes mellitus   . Hyperlipidemia   . Hypertension   . Osteopenia   . Anxiety   . Pain     R. shoulder and lower back and legs  . Hepatitis C     Treated and no longer has  . ANGIOEDEMA 01/24/2010  . ANXIETY 11/07/2006  . BACK PAIN 11/01/2007  . Cough 05/29/2008  . CYST, OVARIAN NEC/NOS 11/05/2006  . DEPRESSION 11/07/2006  . DIABETES MELLITUS, TYPE II 11/07/2006  . DIVERTICULOSIS, COLON 11/07/2006  . FIBROCYSTIC BREAST DISEASE 11/05/2006  . HEAD TRAUMA, CLOSED 01/24/2010  . HEPATITIS B, HX OF 11/05/2006  . HEPATITIS C 11/05/2006  . HYPERLIPIDEMIA 11/07/2006  . HYPERTENSION 05/29/2007  . INSOMNIA, HX OF 11/05/2006  . LOW BACK PAIN 05/29/2007  . OSTEOPENIA 11/07/2006  . Other specified sites of sprains and strains 08/31/2008  . PULMONARY NODULE, LEFT UPPER LOBE 05/31/2009  . SHOULDER PAIN, RIGHT, CHRONIC 06/02/2010  . SINUSITIS- ACUTE-NOS 12/10/2007  . SKIN LESION 06/02/2010   . TOTAL ABDOMINAL HYSTERECTOMY, HX OF 03/27/1990   Past Surgical History  Procedure Laterality Date  . Tubal ligation    . Cesarean section    . Shoulder arthroscopy    . Ovarian cyst surgery      ruptured  . Appendectomy    . Colonoscopy    . Liver biopsy     Family History  Problem Relation Age of Onset  . Liver cancer Mother    History  Substance Use Topics  . Smoking status: Current Every Day Smoker -- 1.00 packs/day  . Smokeless tobacco: Never Used  . Alcohol Use: No   OB History   Grav Para Term Preterm Abortions TAB SAB Ect Mult Living                 Review of Systems  All other systems reviewed and are negative.    Allergies  Naproxen sodium; Sulfonamide derivatives; and Ace inhibitors  Home Medications   Current Outpatient Rx  Name  Route  Sig  Dispense  Refill  . ALPRAZolam (XANAX) 1 MG tablet   Oral   Take 1 mg by mouth 3 (three) times daily as needed for anxiety.         Marland Kitchen aspirin 81 MG EC tablet   Oral   Take 81 mg by mouth daily.           Marland Kitchen atenolol (TENORMIN) 50  MG tablet   Oral   Take 50 mg by mouth daily.         . B Complex-C (B-COMPLEX WITH VITAMIN C) tablet   Oral   Take 1 tablet by mouth daily.         Marland Kitchen GINKGO BILOBA COMPLEX PO   Oral   Take 1 tablet by mouth daily.         . Ginseng 500 MG CAPS   Oral   Take 1 capsule by mouth daily.         . metFORMIN (GLUCOPHAGE) 500 MG tablet   Oral   Take 2 tablets (1,000 mg total) by mouth daily with breakfast.   180 tablet   3   . Multiple Vitamins-Minerals (CENTRUM SILVER PO)   Oral   Take 1 tablet by mouth daily.           Marland Kitchen oxycodone (ROXICODONE) 30 MG immediate release tablet   Oral   Take 15-30 mg by mouth every 6 (six) hours. 1/2 by mouth AM, 1/2 by mouth at lunch, 1 by mouth at diner and 1 by mouth at bedtime.         . pravastatin (PRAVACHOL) 40 MG tablet   Oral   Take 80 mg by mouth daily.         . sertraline (ZOLOFT) 100 MG tablet   Oral    Take 1 tablet (100 mg total) by mouth daily.   90 tablet   3   . tiZANidine (ZANAFLEX) 2 MG tablet   Oral   Take 2 mg by mouth 2 (two) times daily as needed (for muscle relaxation / sleep).          Marland Kitchen oxyCODONE-acetaminophen (PERCOCET) 5-325 MG per tablet   Oral   Take 2 tablets by mouth every 6 (six) hours as needed for pain.   40 tablet   0    BP 153/82  Pulse 82  Temp(Src) 98.5 F (36.9 C) (Oral)  Resp 13  SpO2 100% Physical Exam  Nursing note and vitals reviewed. Constitutional: She is oriented to person, place, and time. She appears well-developed and well-nourished.  HENT:  Head: Normocephalic and atraumatic.  Eyes: Conjunctivae and EOM are normal. Pupils are equal, round, and reactive to light.  Neck: Normal range of motion and phonation normal. Neck supple.  Cardiovascular: Normal rate, regular rhythm and intact distal pulses.   Pulmonary/Chest: Effort normal and breath sounds normal. She exhibits no tenderness.  Abdominal: Soft. She exhibits no distension. There is no tenderness. There is no guarding.  Musculoskeletal:  Left ankle is tender and swollen with a mild varus deformity. There is an intact palpable pulse in the left dorsal pedis area. This was also interrogated with a Doppler indicating a normal biphasic pulse. She is neurovascular intact distally in the toes of the left foot.  Neurological: She is alert and oriented to person, place, and time. She has normal strength. She exhibits normal muscle tone.  Skin: Skin is warm and dry.  Psychiatric: She has a normal mood and affect. Her behavior is normal. Judgment and thought content normal.    ED Course  ORTHOPEDIC INJURY TREATMENT Date/Time: 11/29/2012 1:36 AM Performed by: Effie Shy, Pragya Lofaso L Authorized by: Mancel Bale L Consent: Verbal consent obtained. written consent obtained. Risks and benefits: risks, benefits and alternatives were discussed Consent given by: patient Patient understanding: patient  states understanding of the procedure being performed Patient consent: the patient's understanding of the procedure matches  consent given Procedure consent: procedure consent matches procedure scheduled Relevant documents: relevant documents present and verified Test results: test results available and properly labeled Site marked: the operative site was not marked Imaging studies: imaging studies available Patient identity confirmed: verbally with patient, arm band and provided demographic data Injury location: ankle Location details: left ankle Injury type: fracture Fracture type: trimalleolar Pre-procedure neurovascular assessment: neurovascularly intact Pre-procedure distal perfusion: normal Pre-procedure neurological function: normal Pre-procedure range of motion: reduced Local anesthesia used: no Patient sedated: yes Sedation type: moderate (conscious) sedation Sedatives: etomidate Analgesia: fentanyl Sedation start date/time: 11/29/2012 12:50 AM Sedation end date/time: 11/29/2012 1:38 AM Manipulation performed: yes X-ray confirmed reduction: yes Immobilization: splint Splint type: long leg Supplies used: Ortho-Glass Post-procedure neurovascular assessment: post-procedure neurovascularly intact Comments: Results discussed with Orthopedist   (including critical care time)  Medications  fentaNYL (SUBLIMAZE) injection 100 mcg (100 mcg Intravenous Given 11/29/12 0008)  ondansetron (ZOFRAN) injection 4 mg (4 mg Intravenous Given 11/28/12 2217)  sodium chloride 0.9 % bolus 1,000 mL (0 mLs Intravenous Stopped 11/28/12 2316)  etomidate (AMIDATE) injection 11 mg (11 mg Intravenous Given 11/29/12 0100)  etomidate (AMIDATE) injection (11 mg Intravenous Given 11/29/12 0100)  oxyCODONE-acetaminophen (PERCOCET/ROXICET) 5-325 MG per tablet 2 tablet (2 tablets Oral Given 11/29/12 0223)   She was placed in a plaster splint; prior to imaging.  No data found.  2339-Consult complete with Dr. Despina Hick.  Patient case explained and discussed. He recommends that I reduced the ankle fracture and contact him to discuss the followup images and arrange for further evaluation and treatment. Call ended at 2345  Procedural sedation Performed by: Mancel Bale L Consent: Verbal consent obtained. Risks and benefits: risks, benefits and alternatives were discussed Required items: required blood products, implants, devices, and special equipment available Patient identity confirmed: arm band and provided demographic data Time out: Immediately prior to procedure a "time out" was called to verify the correct patient, procedure, equipment, support staff and site/side marked as required.  Sedation type: moderate (conscious) sedation NPO time confirmed and considedered  Sedatives: ETOMIDATE  Physician Time at Bedside: 25 minutes  Vitals: Vital signs were monitored during sedation. Cardiac Monitor, pulse oximeter Patient tolerance: Patient tolerated the procedure well with no immediate complications. Comments: Pt with uneventful recovered. Returned to pre-procedural sedation baseline   1:40 AM Reevaluation with update and discussion. After initial assessment and treatment, an updated evaluation reveals her pain is improved, now, it is, "dull". Hudsen Fei L    1:40 AM-Consult complete with Dr. Gerri Spore. Patient case explained and discussed. He agrees to manage patient in office for further evaluation and treatment. Call ended at 0155    Labs Review Labs Reviewed - No data to display Imaging Review Dg Ankle Complete Left  11/28/2012   *RADIOLOGY REPORT*  Clinical Data: Ankle injury.  LEFT ANKLE COMPLETE - 3+ VIEW  Comparison: None.  Findings: Avulsion fracture of the medial malleolus with 100% lateral displacement.  There is a comminuted fracture through the distal fibular diaphysis, impacted.  Posterior malleolar fracture, displaced superiorly.  There is concavity in the central talus, consistent with  osteochondral injury.  The ankle mortise is not congruent, with eversion of the foot and widening of the ankle joint.  IMPRESSION:  1.  Displaced acute fractures of the medial malleolus, posterior malleolus, and distal fibula.  There is distal syndesmotic injury with incongruent ankle mortise. 2.  Subchondral fracture of the central talar dome.   Original Report Authenticated By: Tiburcio Pea   Dg Ankle Left Port  11/29/2012   *RADIOLOGY REPORT*  Clinical Data: Postreduction  PORTABLE LEFT ANKLE - 2 VIEW  Comparison: Prior radiograph performed earlier in the evening on 11/28/2012  Findings: Cast material is again seen overlying left ankle.  The left ankle is now in near anatomic alignment post reduction. Distal fibular and medial malleolar fractures are again noted, which are improved and anatomical alignment.  Posterior malleolar fracture is also unchanged.  IMPRESSION: 1.  Left ankle and near anatomic alignment status post reduction.  2.  Unchanged fractures of the distal fibula, medial malleolus, and posterior malleolus.   Original Report Authenticated By: Rise Mu, M.D.    MDM   1. Trimalleolar fracture of ankle, closed, left, initial encounter      Slip and fall with distal fibula and tibia fracture. Significant displacement of fracture fragments and ankle mortise. Abnormalities improved with reduction in ED. Case discussed with orthopedics  Nursing Notes Reviewed/ Care Coordinated, and agree without changes. Applicable Imaging Reviewed.  Interpretation of Laboratory Data incorporated into ED treatment   Plan: Home Medications- Percocet; Home Treatments and Observation- Splint and elevate, Crutches non weight bearing; return here if the recommended treatment, does not improve the symptoms; Recommended follow up- Ortho today or 12/02/12    Flint Melter, MD 11/30/12 1356  Flint Melter, MD 11/30/12 1357

## 2012-11-28 NOTE — Progress Notes (Signed)
Orthopedic Tech Progress Note Patient Details:  [name] 1945/06/29 [id number]  Ortho Devices Type of Ortho Device: Ace wrap;Stirrup splint Ortho Device/Splint Location: LLE Ortho Device/Splint Interventions: Ordered;Application   [name] 11/28/2012, 9:57 PM

## 2012-11-28 NOTE — ED Notes (Addendum)
Deformity to left ankle, unable to palpate pedal pulse. Pedal pulse heard with doppler.

## 2012-11-29 ENCOUNTER — Emergency Department (HOSPITAL_COMMUNITY): Payer: Medicare Other

## 2012-11-29 MED ORDER — OXYCODONE-ACETAMINOPHEN 5-325 MG PO TABS
2.0000 | ORAL_TABLET | Freq: Once | ORAL | Status: AC
  Administered 2012-11-29: 2 via ORAL
  Filled 2012-11-29: qty 2

## 2012-11-29 MED ORDER — OXYCODONE-ACETAMINOPHEN 5-325 MG PO TABS: 2.0000 | ORAL_TABLET | Freq: Four times a day (QID) | ORAL | Status: DC | PRN

## 2012-11-29 MED ORDER — ETOMIDATE 2 MG/ML IV SOLN
INTRAVENOUS | Status: AC | PRN
  Administered 2012-11-29: 11 mg via INTRAVENOUS

## 2012-11-29 NOTE — ED Notes (Signed)
Pt informed she needs to call for a ride home.

## 2012-11-29 NOTE — ED Notes (Signed)
XRAY called and en route to pt's room.

## 2012-11-29 NOTE — ED Notes (Signed)
Pt given ice for her ankle 

## 2012-11-29 NOTE — ED Notes (Signed)
Pharmacy called to inquire about etomidate.

## 2012-12-03 HISTORY — PX: ORIF ANKLE FRACTURE: SHX5408

## 2012-12-09 ENCOUNTER — Encounter (HOSPITAL_BASED_OUTPATIENT_CLINIC_OR_DEPARTMENT_OTHER): Payer: Self-pay | Admitting: *Deleted

## 2012-12-09 NOTE — Progress Notes (Signed)
Needs to come in for bmet-ekg done 3/14 Smoker-no cardiac problems

## 2012-12-11 ENCOUNTER — Encounter (HOSPITAL_BASED_OUTPATIENT_CLINIC_OR_DEPARTMENT_OTHER)
Admission: RE | Admit: 2012-12-11 | Discharge: 2012-12-11 | Disposition: A | Discharge status: Home / Self Care | Payer: Medicare Other | Source: Ambulatory Visit | Attending: Orthopedic Surgery | Admitting: Orthopedic Surgery

## 2012-12-11 LAB — BASIC METABOLIC PANEL
Chloride: 99 mEq/L (ref 96–112)
GFR calc Af Amer: >90 mL/min (ref >90)
Potassium: 5.4 mEq/L — ABNORMAL HIGH (ref 3.5–5.1)
Sodium: 140 mEq/L (ref 135–145)

## 2012-12-12 ENCOUNTER — Encounter (HOSPITAL_BASED_OUTPATIENT_CLINIC_OR_DEPARTMENT_OTHER): Payer: Self-pay | Admitting: Certified Registered Nurse Anesthetist

## 2012-12-12 ENCOUNTER — Ambulatory Visit (HOSPITAL_BASED_OUTPATIENT_CLINIC_OR_DEPARTMENT_OTHER): Payer: Medicare Other | Admitting: Certified Registered Nurse Anesthetist

## 2012-12-12 ENCOUNTER — Ambulatory Visit (HOSPITAL_COMMUNITY): Payer: Medicare Other

## 2012-12-12 ENCOUNTER — Ambulatory Visit (HOSPITAL_BASED_OUTPATIENT_CLINIC_OR_DEPARTMENT_OTHER)
Admission: RE | Admit: 2012-12-12 | Discharge: 2012-12-12 | Disposition: A | Discharge status: Home / Self Care | Payer: Medicare Other | Source: Ambulatory Visit | Attending: Orthopedic Surgery | Admitting: Orthopedic Surgery

## 2012-12-12 ENCOUNTER — Encounter (HOSPITAL_BASED_OUTPATIENT_CLINIC_OR_DEPARTMENT_OTHER)
Admission: RE | Disposition: A | Discharge status: Home / Self Care | Payer: Self-pay | Source: Ambulatory Visit | Attending: Orthopedic Surgery

## 2012-12-12 DIAGNOSIS — E119 Type 2 diabetes mellitus without complications: Secondary | ICD-10-CM | POA: Insufficient documentation

## 2012-12-12 DIAGNOSIS — E785 Hyperlipidemia, unspecified: Secondary | ICD-10-CM | POA: Insufficient documentation

## 2012-12-12 DIAGNOSIS — I10 Essential (primary) hypertension: Secondary | ICD-10-CM | POA: Insufficient documentation

## 2012-12-12 DIAGNOSIS — W19XXXA Unspecified fall, initial encounter: Secondary | ICD-10-CM | POA: Insufficient documentation

## 2012-12-12 DIAGNOSIS — S82853A Displaced trimalleolar fracture of unspecified lower leg, initial encounter for closed fracture: Secondary | ICD-10-CM | POA: Insufficient documentation

## 2012-12-12 DIAGNOSIS — F411 Generalized anxiety disorder: Secondary | ICD-10-CM | POA: Insufficient documentation

## 2012-12-12 DIAGNOSIS — S93439A Sprain of tibiofibular ligament of unspecified ankle, initial encounter: Secondary | ICD-10-CM | POA: Insufficient documentation

## 2012-12-12 DIAGNOSIS — S82852D Displaced trimalleolar fracture of left lower leg, subsequent encounter for closed fracture with routine healing: Secondary | ICD-10-CM

## 2012-12-12 HISTORY — PX: EXTERNAL FIXATION LEG: SHX1549

## 2012-12-12 HISTORY — DX: Presence of spectacles and contact lenses: Z97.3

## 2012-12-12 HISTORY — DX: Presence of dental prosthetic device (complete) (partial): Z97.2

## 2012-12-12 SURGERY — EXTERNAL FIXATION, LOWER EXTREMITY
Anesthesia: General | Site: Ankle | Laterality: Left | Wound class: Clean Contaminated

## 2012-12-12 MED ORDER — OXYCODONE HCL 5 MG/5ML PO SOLN: 5.0000 mg | Freq: Once | ORAL | Status: AC | PRN

## 2012-12-12 MED ORDER — MIDAZOLAM HCL 2 MG/2ML IJ SOLN: 1.0000 mg | INTRAMUSCULAR | Status: DC | PRN

## 2012-12-12 MED ORDER — EPHEDRINE SULFATE 50 MG/ML IJ SOLN
INTRAMUSCULAR | Status: DC | PRN
  Administered 2012-12-12: 10 mg via INTRAVENOUS

## 2012-12-12 MED ORDER — LACTATED RINGERS IV SOLN
INTRAVENOUS | Status: DC
  Administered 2012-12-12: 09:00:00 via INTRAVENOUS

## 2012-12-12 MED ORDER — FENTANYL CITRATE 0.05 MG/ML IJ SOLN
INTRAMUSCULAR | Status: DC | PRN
  Administered 2012-12-12: 100 ug via INTRAVENOUS

## 2012-12-12 MED ORDER — PROPOFOL 10 MG/ML IV BOLUS
INTRAVENOUS | Status: DC | PRN
  Administered 2012-12-12: 200 mg via INTRAVENOUS

## 2012-12-12 MED ORDER — OXYCODONE HCL 5 MG PO TABS
5.0000 mg | ORAL_TABLET | Freq: Once | ORAL | Status: AC | PRN
  Administered 2012-12-12: 5 mg via ORAL

## 2012-12-12 MED ORDER — MIDAZOLAM HCL 5 MG/5ML IJ SOLN
INTRAMUSCULAR | Status: DC | PRN
  Administered 2012-12-12: 1 mg via INTRAVENOUS

## 2012-12-12 MED ORDER — HYDROMORPHONE HCL PF 1 MG/ML IJ SOLN
0.2500 mg | INTRAMUSCULAR | Status: DC | PRN
  Administered 2012-12-12 (×3): 0.5 mg via INTRAVENOUS

## 2012-12-12 MED ORDER — ONDANSETRON HCL 4 MG/2ML IJ SOLN
INTRAMUSCULAR | Status: DC | PRN
  Administered 2012-12-12: 4 mg via INTRAVENOUS

## 2012-12-12 MED ORDER — LIDOCAINE HCL (CARDIAC) 20 MG/ML IV SOLN
INTRAVENOUS | Status: DC | PRN
  Administered 2012-12-12: 60 mg via INTRAVENOUS

## 2012-12-12 MED ORDER — FENTANYL CITRATE 0.05 MG/ML IJ SOLN: 50.0000 ug | INTRAMUSCULAR | Status: DC | PRN

## 2012-12-12 MED ORDER — CEFAZOLIN SODIUM 1-5 GM-% IV SOLN
INTRAVENOUS | Status: DC | PRN
  Administered 2012-12-12: 2 g via INTRAVENOUS

## 2012-12-12 MED ORDER — ONDANSETRON HCL 4 MG/2ML IJ SOLN: 4.0000 mg | Freq: Once | INTRAMUSCULAR | Status: DC | PRN

## 2012-12-12 MED ORDER — DEXAMETHASONE SODIUM PHOSPHATE 10 MG/ML IJ SOLN
INTRAMUSCULAR | Status: DC | PRN
  Administered 2012-12-12: 4 mg via INTRAVENOUS

## 2012-12-12 MED ORDER — FENTANYL CITRATE 0.05 MG/ML IJ SOLN
50.0000 ug | INTRAMUSCULAR | Status: DC | PRN
  Administered 2012-12-12: 100 ug via INTRAVENOUS

## 2012-12-12 SURGICAL SUPPLY — 70 items
BANDAGE ELASTIC 4 VELCRO ST LF (GAUZE/BANDAGES/DRESSINGS) ×4 IMPLANT
BANDAGE ESMARK 6X9 LF (GAUZE/BANDAGES/DRESSINGS) IMPLANT
BANDAGE GAUZE ELAST BULKY 4 IN (GAUZE/BANDAGES/DRESSINGS) ×4 IMPLANT
BLADE SURG 15 STRL LF DISP TIS (BLADE) IMPLANT
BLADE SURG 15 STRL SS (BLADE)
BNDG COHESIVE 4X5 TAN STRL (GAUZE/BANDAGES/DRESSINGS) IMPLANT
BNDG COHESIVE 6X5 TAN STRL LF (GAUZE/BANDAGES/DRESSINGS) IMPLANT
BNDG ESMARK 6X9 LF (GAUZE/BANDAGES/DRESSINGS)
BRUSH SCRUB EZ PLAIN DRY (MISCELLANEOUS) ×2 IMPLANT
CHLORAPREP W/TINT 26ML (MISCELLANEOUS) IMPLANT
COVER TABLE BACK 60X90 (DRAPES) ×2 IMPLANT
DECANTER SPIKE VIAL GLASS SM (MISCELLANEOUS) IMPLANT
DRAPE C-ARM 42X72 X-RAY (DRAPES) IMPLANT
DRAPE C-ARMOR (DRAPES) IMPLANT
DRAPE EXTREMITY T 121X128X90 (DRAPE) IMPLANT
DRAPE OEC MINIVIEW 54X84 (DRAPES) IMPLANT
DRAPE SURG 17X23 STRL (DRAPES) IMPLANT
DRAPE U-SHAPE 47X51 STRL (DRAPES) IMPLANT
DRSG EMULSION OIL 3X3 NADH (GAUZE/BANDAGES/DRESSINGS) ×4 IMPLANT
DRSG PAD ABDOMINAL 8X10 ST (GAUZE/BANDAGES/DRESSINGS) ×2 IMPLANT
ELECT REM PT RETURN 9FT ADLT (ELECTROSURGICAL)
ELECTRODE REM PT RTRN 9FT ADLT (ELECTROSURGICAL) IMPLANT
GAUZE XEROFORM 1X8 LF (GAUZE/BANDAGES/DRESSINGS) ×4 IMPLANT
GLOVE BIO SURGEON STRL SZ8 (GLOVE) IMPLANT
GLOVE BIOGEL PI IND STRL 7.0 (GLOVE) ×1 IMPLANT
GLOVE BIOGEL PI IND STRL 7.5 (GLOVE) IMPLANT
GLOVE BIOGEL PI IND STRL 8 (GLOVE) IMPLANT
GLOVE BIOGEL PI INDICATOR 7.0 (GLOVE) ×1
GLOVE BIOGEL PI INDICATOR 7.5 (GLOVE)
GLOVE BIOGEL PI INDICATOR 8 (GLOVE)
GLOVE ECLIPSE 6.5 STRL STRAW (GLOVE) ×2 IMPLANT
GLOVE ECLIPSE 7.0 STRL STRAW (GLOVE) IMPLANT
GLOVE EXAM NITRILE MD LF STRL (GLOVE) IMPLANT
GOWN PREVENTION PLUS XLARGE (GOWN DISPOSABLE) ×2 IMPLANT
GOWN PREVENTION PLUS XXLARGE (GOWN DISPOSABLE) IMPLANT
NDL SAFETY ECLIPSE 18X1.5 (NEEDLE) IMPLANT
NEEDLE HYPO 18GX1.5 SHARP (NEEDLE)
NEEDLE HYPO 22GX1.5 SAFETY (NEEDLE) IMPLANT
NS IRRIG 1000ML POUR BTL (IV SOLUTION) IMPLANT
PACK BASIN DAY SURGERY FS (CUSTOM PROCEDURE TRAY) IMPLANT
PAD CAST 4YDX4 CTTN HI CHSV (CAST SUPPLIES) ×2 IMPLANT
PADDING CAST ABS 4INX4YD NS (CAST SUPPLIES)
PADDING CAST ABS COTTON 4X4 ST (CAST SUPPLIES) IMPLANT
PADDING CAST COTTON 4X4 STRL (CAST SUPPLIES) ×2
PADDING CAST COTTON 6X4 STRL (CAST SUPPLIES) IMPLANT
PENCIL BUTTON HOLSTER BLD 10FT (ELECTRODE) IMPLANT
SANITIZER HAND PURELL 535ML FO (MISCELLANEOUS) ×2 IMPLANT
SHEET MEDIUM DRAPE 40X70 STRL (DRAPES) IMPLANT
SLEEVE SCD COMPRESS KNEE MED (MISCELLANEOUS) IMPLANT
SPLINT FAST PLASTER 5X30 (CAST SUPPLIES) ×10
SPLINT PLASTER CAST FAST 5X30 (CAST SUPPLIES) ×10 IMPLANT
SPONGE GAUZE 4X4 12PLY (GAUZE/BANDAGES/DRESSINGS) ×2 IMPLANT
SPONGE LAP 18X18 X RAY DECT (DISPOSABLE) IMPLANT
STAPLER VISISTAT 35W (STAPLE) IMPLANT
STOCKINETTE 6  STRL (DRAPES)
STOCKINETTE 6 STRL (DRAPES) IMPLANT
STRIP CLOSURE SKIN 1/2X4 (GAUZE/BANDAGES/DRESSINGS) IMPLANT
SUCTION FRAZIER TIP 10 FR DISP (SUCTIONS) IMPLANT
SUT ETHILON 3 0 PS 1 (SUTURE) IMPLANT
SUT MNCRL AB 3-0 PS2 18 (SUTURE) IMPLANT
SUT MNCRL AB 4-0 PS2 18 (SUTURE) IMPLANT
SUT VIC AB 2-0 SH 18 (SUTURE) IMPLANT
SUT VIC AB 2-0 SH 27 (SUTURE)
SUT VIC AB 2-0 SH 27XBRD (SUTURE) IMPLANT
SUT VICRYL 4-0 PS2 18IN ABS (SUTURE) IMPLANT
SYR BULB 3OZ (MISCELLANEOUS) IMPLANT
TOWEL OR 17X24 6PK STRL BLUE (TOWEL DISPOSABLE) ×2 IMPLANT
TOWEL OR NON WOVEN STRL DISP B (DISPOSABLE) IMPLANT
TUBE CONNECTING 20X1/4 (TUBING) IMPLANT
UNDERPAD 30X30 INCONTINENT (UNDERPADS AND DIAPERS) ×2 IMPLANT

## 2012-12-12 NOTE — H&P (Signed)
Crystal Thornton is an 67 y.o. female.   Chief Complaint: left ankle fracture HPI: 67 y/o female now 8 days s/p closed reduction and application of an external fixator for her displaced trimal ankle fracture and syndesmosis disruption presents now for repeat closed reduction and adjustment of the external fixator.  She fell two days after having the fixator put on and caused the frame to shift losing reduction of the ankle fractures.  Past Medical History  Diagnosis Date  . Depression   . Diabetes mellitus   . Hyperlipidemia   . Hypertension   . Osteopenia   . Anxiety   . Pain     R. shoulder and lower back and legs  . Hepatitis C     Treated and no longer has  . ANGIOEDEMA 01/24/2010  . ANXIETY 11/07/2006  . BACK PAIN 11/01/2007  . Cough 05/29/2008  . CYST, OVARIAN NEC/NOS 11/05/2006  . DEPRESSION 11/07/2006  . DIABETES MELLITUS, TYPE II 11/07/2006  . DIVERTICULOSIS, COLON 11/07/2006  . FIBROCYSTIC BREAST DISEASE 11/05/2006  . HEAD TRAUMA, CLOSED 01/24/2010  . HEPATITIS B, HX OF 11/05/2006  . HEPATITIS C 11/05/2006  . HYPERLIPIDEMIA 11/07/2006  . HYPERTENSION 05/29/2007  . INSOMNIA, HX OF 11/05/2006  . LOW BACK PAIN 05/29/2007  . OSTEOPENIA 11/07/2006  . Other specified sites of sprains and strains 08/31/2008  . PULMONARY NODULE, LEFT UPPER LOBE 05/31/2009  . SHOULDER PAIN, RIGHT, CHRONIC 06/02/2010  . SINUSITIS- ACUTE-NOS 12/10/2007  . SKIN LESION 06/02/2010  . TOTAL ABDOMINAL HYSTERECTOMY, HX OF 03/27/1990  . Wears glasses   . Wears dentures     full top    Past Surgical History  Procedure Laterality Date  . Tubal ligation    . Cesarean section    . Shoulder arthroscopy  2008    left  . Ovarian cyst surgery      ruptured  . Appendectomy    . Colonoscopy    . Liver biopsy    . Cervical fusion  2013    Family History  Problem Relation Age of Onset  . Liver cancer Mother    Social History:  reports that she has been smoking.  She has never used smokeless tobacco. She reports that she  does not drink alcohol or use illicit drugs.  Allergies:  Allergies  Allergen Reactions  . Naproxen Sodium Anaphylaxis  . Sulfonamide Derivatives Other (See Comments)    Causes nephritis  . Ace Inhibitors Swelling    Lip swelling    No prescriptions prior to admission    Results for orders placed during the hospital encounter of 12/12/12 (from the past 48 hour(s))  BASIC METABOLIC PANEL     Status: Abnormal   Collection Time    12/11/12 12:10 PM      Result Value Range   Sodium 140  135 - 145 mEq/L   Potassium 5.4 (*) 3.5 - 5.1 mEq/L   Chloride 99  96 - 112 mEq/L   CO2 29  19 - 32 mEq/L   Glucose, Bld 161 (*) 70 - 99 mg/dL   BUN 19  6 - 23 mg/dL   Creatinine, Ser 1.61  0.50 - 1.10 mg/dL   Calcium 09.6  8.4 - 04.5 mg/dL   GFR calc non Af Amer >90  >90 mL/min   GFR calc Af Amer >90  >90 mL/min   Comment: (NOTE)     The eGFR has been calculated using the CKD EPI equation.     This calculation has  not been validated in all clinical situations.     eGFR's persistently <90 mL/min signify possible Chronic Kidney     Disease.   No results found.  ROS  No recent f/c/n/v/wt loss  Height 5\' 7"  (1.702 m), weight 72.576 kg (160 lb). Physical Exam  [age] wd woman appearing older than her stated age.  A and O.  Mood and affect normal.  EOMI.  Resp unlabored.  L ankle with delta frame in place.  Ankle appears grossly in valgus.  Skin intact.  Blisters resolving.  5/5 strength in PF and DF of the ankle and toes.  Sens to LT intact at the foot.  Assessment/Plan L ankle trimal fracture and syndesmosis disruption with loss of fixation of external fixator - to OR for repeat reduction and adjustment of frame.  The risks and benefits of the alternative treatment options have been discussed in detail.  The patient wishes to proceed with surgery and specifically understands risks of bleeding, infection, nerve damage, blood clots, need for additional surgery, amputation and death.   [name] January 06, 2013, 7:19 AM

## 2012-12-12 NOTE — Transfer of Care (Signed)
Immediate Anesthesia Transfer of Care Note  Patient: Crystal Thornton  Procedure(s) Performed: Procedure(s): LEFT ADJUSTMENT OF EXTERNAL FIXATION UNDER ANESTHESIA (Left)  Patient Location: PACU  Anesthesia Type:General  Level of Consciousness: awake, alert , oriented and patient cooperative  Airway & Oxygen Therapy: Patient Spontanous Breathing and Patient connected to face mask oxygen  Post-op Assessment: Report given to PACU RN and Post -op Vital signs reviewed and stable  Post vital signs: Reviewed and stable  Complications: No apparent anesthesia complications

## 2012-12-12 NOTE — Op Note (Deleted)
[name], Crystal Thornton               ACCOUNT NO.:  [id number]  MEDICAL RECORD NO.:  [id number]  LOCATION:  A10C                         FACILITY:  [facility]  PHYSICIAN:  [name], MD        DATE OF BIRTH:  05-Jul-1945  DATE OF PROCEDURE:  12/12/2012 DATE OF DISCHARGE:  12/12/2012                              OPERATIVE REPORT   PREOPERATIVE DIAGNOSIS:  Left ankle trimalleolar fracture and syndesmosis disruption, status post closed reduction and external fixation with interval loss of reduction.  POSTOPERATIVE DIAGNOSIS:  Left ankle trimalleolar fracture and syndesmosis disruption, status post closed reduction and external fixation with interval loss of reduction.  PROCEDURES: 1. Revision closed reduction of the left ankle trimalleolar fracture. 2. Adjustment of the left ankle external fixator under anesthesia.  SURGEON:  [name], MD.  ASSISTANTLorin Picket Flowers PA-C.  ANESTHESIA:  General.  ESTIMATED BLOOD LOSS:  None.  TOURNIQUET:  Not applicable.  COMPLICATIONS:  None apparent.  DISPOSITION:  Extubated, awake, and stable to recovery.  INDICATIONS FOR PROCEDURE:  The patient is a 67 year old woman who has a left ankle trimalleolar fracture and syndesmosis disruption.  She was seen 8 days ago, and taken to the operating room, where she underwent closed reduction and application of a delta frame external fixator.  She presented to clinic earlier this week having fallen at home.  She was found to have knocked her external fixator frame out of alignment and lost reduction at the fracture site.  She presents now for repeat reduction of the fracture and adjustment of the external fixator frame. She understands the risks and benefits, the alternative treatment options and elects surgical treatment.  She specifically understands risks of bleeding, infection, nerve damage, blood clots, need for additional surgery, amputation, and death.  PROCEDURE IN DETAIL:  After  preoperative consent was obtained, the correct operative site was identified.  The patient was brought to the operating room supine on a stretcher.  General anesthesia was induced. Preoperative antibiotics were administered.  Surgical time-out was taken.  The left lower extremity dressings were all removed.  The pin tracts were noted to be in good condition with no signs of any infection.  The blisters medially and laterally about the ankle were resolving.  The ankle was noted to be grossly in valgus malalignment.  The frame was then loosened at the level of all of the pin to bar clamps.  Longitudinal traction was then applied restoring appropriate alignment to the ankle.  The frame was then provisionally tightened.  AP and lateral fluoroscopic images confirmed appropriate alignment of the ankle.  The frame was then securely tightened all of the bolts and clamps.  The blisters were dressed with Adaptic, pin sites were dressed with Xeroform and Kerlix.  4x4s were placed over the blisters, a short- leg posterior splint was then applied.  The patient was then awakened from anesthesia and transported to the recovery room in stable condition.  FOLLOWUP PLAN:  The patient will be nonweightbearing on the left lower extremity.  She will follow up with me in a week for wound check and hopefully to schedule her surgery.  Scott Flowers PA-C was present and his  assistance was critical in obtaining and maintaining reduction and adjusting the frame as well as applying the dressings and splint.     [name], MD     JH/MEDQ  D:  12/12/2012  T:  12/12/2012  Job:  [id number]

## 2012-12-12 NOTE — Brief Op Note (Signed)
12/12/2012  9:43 AM  PATIENT:  Crystal Thornton  67 y.o. female  PRE-OPERATIVE DIAGNOSIS:  Left ankle trimalleolar fracture and syndesmosis disruption s/p closed reduction and external fixation with loss of reduction  POST-OPERATIVE DIAGNOSIS:  same  Procedure(s): 1.  Revision closed reduction of left ankle trimalleolar fracture 2.  Adjustment of left ankle external fixator under anesthesia  SURGEON:  [name], MD  ASSISTANT:  Lorin Picket Flowers, PA-C  ANESTHESIA:   General  EBL:  none  TOURNIQUET:  n/a  COMPLICATIONS:  None apparent  DISPOSITION:  Extubated, awake and stable to recovery.  DICTATION ID:  [id number]

## 2012-12-12 NOTE — Anesthesia Preprocedure Evaluation (Addendum)
Anesthesia Evaluation  Patient identified by MRN, date of birth, ID band Patient awake    Airway Mallampati: I TM Distance: >3 FB Neck ROM: Full    Dental  (+) Upper Dentures, Poor Dentition and Dental Advisory Given   Pulmonary  breath sounds clear to auscultation        Cardiovascular hypertension, Pt. on home beta blockers and Pt. on medications Rhythm:Regular Rate:Normal     Neuro/Psych PSYCHIATRIC DISORDERS Anxiety Depression    GI/Hepatic (+) Hepatitis -, B and C  Endo/Other  diabetes, Type 2, Oral Hypoglycemic Agents  Renal/GU      Musculoskeletal   Abdominal   Peds  Hematology   Anesthesia Other Findings   Reproductive/Obstetrics                         Anesthesia Physical Anesthesia Plan  ASA: III  Anesthesia Plan: General   Post-op Pain Management:    Induction: Intravenous  Airway Management Planned: LMA  Additional Equipment:   Intra-op Plan:   Post-operative Plan: Extubation in OR  Informed Consent: I have reviewed the patients History and Physical, chart, labs and discussed the procedure including the risks, benefits and alternatives for the proposed anesthesia with the patient or authorized representative who has indicated his/her understanding and acceptance.   Dental advisory given  Plan Discussed with: CRNA, Anesthesiologist and Surgeon  Anesthesia Plan Comments:         Anesthesia Quick Evaluation

## 2012-12-12 NOTE — Op Note (Signed)
NAME:  Spieth, Shastina               ACCOUNT NO.:  [id number]  MEDICAL RECORD NO.:  [id number]  LOCATION:  A10C                         FACILITY:  [facility]  PHYSICIAN:  Ayoub Arey, MD        DATE OF BIRTH:  02/08/1946  DATE OF PROCEDURE:  12/12/2012 DATE OF DISCHARGE:  12/12/2012                              OPERATIVE REPORT   PREOPERATIVE DIAGNOSIS:  Left ankle trimalleolar fracture and syndesmosis disruption, status post closed reduction and external fixation with interval loss of reduction.  POSTOPERATIVE DIAGNOSIS:  Left ankle trimalleolar fracture and syndesmosis disruption, status post closed reduction and external fixation with interval loss of reduction.  PROCEDURES: 1. Revision closed reduction of the left ankle trimalleolar fracture. 2. Adjustment of the left ankle external fixator under anesthesia.  SURGEON:  Nagi Furio, MD.  ASSISTANT:  Scott Flowers PA-C.  ANESTHESIA:  General.  ESTIMATED BLOOD LOSS:  None.  TOURNIQUET:  Not applicable.  COMPLICATIONS:  None apparent.  DISPOSITION:  Extubated, awake, and stable to recovery.  INDICATIONS FOR PROCEDURE:  The patient is a 67-year-old woman who has a left ankle trimalleolar fracture and syndesmosis disruption.  She was seen 8 days ago, and taken to the operating room, where she underwent closed reduction and application of a delta frame external fixator.  She presented to clinic earlier this week having fallen at home.  She was found to have knocked her external fixator frame out of alignment and lost reduction at the fracture site.  She presents now for repeat reduction of the fracture and adjustment of the external fixator frame. She understands the risks and benefits, the alternative treatment options and elects surgical treatment.  She specifically understands risks of bleeding, infection, nerve damage, blood clots, need for additional surgery, amputation, and death.  PROCEDURE IN DETAIL:  After  preoperative consent was obtained, the correct operative site was identified.  The patient was brought to the operating room supine on a stretcher.  General anesthesia was induced. Preoperative antibiotics were administered.  Surgical time-out was taken.  The left lower extremity dressings were all removed.  The pin tracts were noted to be in good condition with no signs of any infection.  The blisters medially and laterally about the ankle were resolving.  The ankle was noted to be grossly in valgus malalignment.  The frame was then loosened at the level of all of the pin to bar clamps.  Longitudinal traction was then applied restoring appropriate alignment to the ankle.  The frame was then provisionally tightened.  AP and lateral fluoroscopic images confirmed appropriate alignment of the ankle.  The frame was then securely tightened all of the bolts and clamps.  The blisters were dressed with Adaptic, pin sites were dressed with Xeroform and Kerlix.  4x4s were placed over the blisters, a short- leg posterior splint was then applied.  The patient was then awakened from anesthesia and transported to the recovery room in stable condition.  FOLLOWUP PLAN:  The patient will be nonweightbearing on the left lower extremity.  She will follow up with me in a week for wound check and hopefully to schedule her surgery.  Scott Flowers PA-C was present and his   assistance was critical in obtaining and maintaining reduction and adjusting the frame as well as applying the dressings and splint.     Joe Gee, MD     JH/MEDQ  D:  12/12/2012  T:  12/12/2012  Job:  [id number] 

## 2012-12-12 NOTE — Anesthesia Procedure Notes (Signed)
Procedure Name: LMA Insertion Date/Time: 12/12/2012 9:19 AM Performed by: Dorotha Hirschi D Pre-anesthesia Checklist: Patient identified, Emergency Drugs available, Suction available and Patient being monitored Patient Re-evaluated:Patient Re-evaluated prior to inductionOxygen Delivery Method: Circle System Utilized Preoxygenation: Pre-oxygenation with 100% oxygen Intubation Type: IV induction Ventilation: Mask ventilation without difficulty LMA: LMA inserted LMA Size: 4.0 Number of attempts: 1 Airway Equipment and Method: bite block Placement Confirmation: positive ETCO2 Tube secured with: Tape Dental Injury: Teeth and Oropharynx as per pre-operative assessment

## 2012-12-12 NOTE — Anesthesia Postprocedure Evaluation (Signed)
  Anesthesia Post-op Note  Patient: Crystal Thornton  Procedure(s) Performed: Procedure(s): LEFT ADJUSTMENT OF EXTERNAL FIXATION UNDER ANESTHESIA (Left)  Patient Location: PACU  Anesthesia Type:General  Level of Consciousness: awake, alert  and oriented  Airway and Oxygen Therapy: Patient Spontanous Breathing  Post-op Pain: mild  Post-op Assessment: Post-op Vital signs reviewed  Post-op Vital Signs: Reviewed  Complications: No apparent anesthesia complications

## 2012-12-13 ENCOUNTER — Encounter (HOSPITAL_BASED_OUTPATIENT_CLINIC_OR_DEPARTMENT_OTHER): Payer: Self-pay | Admitting: Orthopedic Surgery

## 2012-12-16 ENCOUNTER — Ambulatory Visit: Payer: Medicare Other | Admitting: Internal Medicine

## 2012-12-17 ENCOUNTER — Ambulatory Visit: Payer: Medicare Other | Admitting: Internal Medicine

## 2012-12-25 ENCOUNTER — Encounter (HOSPITAL_COMMUNITY): Payer: Self-pay | Admitting: Pharmacy Technician

## 2012-12-27 ENCOUNTER — Inpatient Hospital Stay (HOSPITAL_COMMUNITY): Admission: RE | Admit: 2012-12-27 | Payer: Medicare Other | Source: Ambulatory Visit

## 2012-12-31 ENCOUNTER — Encounter (HOSPITAL_BASED_OUTPATIENT_CLINIC_OR_DEPARTMENT_OTHER): Payer: Self-pay | Admitting: *Deleted

## 2012-12-31 ENCOUNTER — Inpatient Hospital Stay (HOSPITAL_COMMUNITY)
Admission: RE | Admit: 2012-12-31 | Discharge: 2012-12-31 | Disposition: A | Discharge status: Home / Self Care | Payer: Medicare Other | Source: Ambulatory Visit

## 2012-12-31 NOTE — Progress Notes (Signed)
Pt here 12/03/12 and 12/12/12 for this ankle-has to have hardware removed and redone-doing well-using IS at home and lungs clear-to bring all meds-wants to stay overnight-told to call office and ask.

## 2012-12-31 NOTE — Pre-Procedure Instructions (Signed)
ANNALEAH ARATA  12/31/2012   Your procedure is scheduled on:  Thursday, October 9th.  Report to [facility], Main Entrance/Entrance "A" at 11:00AM.  Call this number if you have problems the morning of surgery: [phone]   Remember:   Do not eat food or drink liquids after midnight.   Take these medicines the morning of surgery with A SIP OF WATER: atenolol (TENORMIN), sertraline (ZOLOFT).  Take if needed:tiZANidine (ZANAFLEX), oxycodone (ROXICODONE),ALPRAZolam (XANAX).  Stop taking Aspirin, Coumadin, Plavix, Effient and Herbal medicationsGINKGO BILOBA,  Ginseng. Stop taking Vitamins.  Do not take any NSAIDs ie: Ibuprofen,  Advil,Naproxen or any medication containing Aspirin.    Do not wear jewelry, make-up or nail polish.  Do not wear lotions, powders, or perfumes. You may wear deodorant.  Do not shave 48 hours prior to surgery.  Do not bring valuables to the hospital.  [facility] is not responsible for any belongings or valuables.               Contacts, dentures or bridgework may not be worn into surgery.  Leave suitcase in the car. After surgery it may be brought to your room.  For patients admitted to the hospital, discharge time is determined by your treatment team.               Patients discharged the day of surgery will not be allowed to drive home.  Name and phone number of your driver: -  Special Instructions: Shower using CHG 2 nights before surgery and the night before surgery.  If you shower the day of surgery use CHG.  Use special wash - you have one bottle of CHG for all showers.  You should use approximately 1/3 of the bottle for each shower.   Please read over the following fact sheets that you were given: Pain Booklet, Coughing and Deep Breathing and Surgical Site Infection Prevention

## 2013-01-01 ENCOUNTER — Other Ambulatory Visit: Payer: Self-pay | Admitting: Orthopedic Surgery

## 2013-01-02 ENCOUNTER — Encounter (HOSPITAL_BASED_OUTPATIENT_CLINIC_OR_DEPARTMENT_OTHER): Payer: Medicare Other | Admitting: Certified Registered Nurse Anesthetist

## 2013-01-02 ENCOUNTER — Ambulatory Visit (HOSPITAL_BASED_OUTPATIENT_CLINIC_OR_DEPARTMENT_OTHER): Payer: Medicare Other | Admitting: Certified Registered Nurse Anesthetist

## 2013-01-02 ENCOUNTER — Ambulatory Visit (HOSPITAL_BASED_OUTPATIENT_CLINIC_OR_DEPARTMENT_OTHER)
Admission: RE | Admit: 2013-01-02 | Discharge: 2013-01-03 | Disposition: A | Discharge status: Home / Self Care | Payer: Medicare Other | Source: Ambulatory Visit | Attending: Orthopedic Surgery | Admitting: Orthopedic Surgery

## 2013-01-02 ENCOUNTER — Ambulatory Visit (HOSPITAL_COMMUNITY): Payer: Medicare Other

## 2013-01-02 ENCOUNTER — Ambulatory Visit (HOSPITAL_COMMUNITY): Admission: RE | Admit: 2013-01-02 | Payer: Medicare Other | Source: Ambulatory Visit | Admitting: Orthopedic Surgery

## 2013-01-02 ENCOUNTER — Encounter (HOSPITAL_BASED_OUTPATIENT_CLINIC_OR_DEPARTMENT_OTHER): Payer: Self-pay | Admitting: Certified Registered Nurse Anesthetist

## 2013-01-02 ENCOUNTER — Encounter (HOSPITAL_COMMUNITY): Admission: RE | Payer: Self-pay | Source: Ambulatory Visit

## 2013-01-02 ENCOUNTER — Encounter (HOSPITAL_BASED_OUTPATIENT_CLINIC_OR_DEPARTMENT_OTHER)
Admission: RE | Disposition: A | Discharge status: Home / Self Care | Payer: Self-pay | Source: Ambulatory Visit | Attending: Orthopedic Surgery

## 2013-01-02 DIAGNOSIS — B181 Chronic viral hepatitis B without delta-agent: Secondary | ICD-10-CM | POA: Diagnosis not present

## 2013-01-02 DIAGNOSIS — S82853A Displaced trimalleolar fracture of unspecified lower leg, initial encounter for closed fracture: Secondary | ICD-10-CM | POA: Diagnosis not present

## 2013-01-02 DIAGNOSIS — S8990XA Unspecified injury of unspecified lower leg, initial encounter: Secondary | ICD-10-CM | POA: Diagnosis present

## 2013-01-02 DIAGNOSIS — E119 Type 2 diabetes mellitus without complications: Secondary | ICD-10-CM | POA: Diagnosis not present

## 2013-01-02 DIAGNOSIS — I10 Essential (primary) hypertension: Secondary | ICD-10-CM | POA: Insufficient documentation

## 2013-01-02 DIAGNOSIS — Z4789 Encounter for other orthopedic aftercare: Secondary | ICD-10-CM | POA: Insufficient documentation

## 2013-01-02 DIAGNOSIS — S82842A Displaced bimalleolar fracture of left lower leg, initial encounter for closed fracture: Secondary | ICD-10-CM

## 2013-01-02 DIAGNOSIS — X58XXXA Exposure to other specified factors, initial encounter: Secondary | ICD-10-CM | POA: Insufficient documentation

## 2013-01-02 HISTORY — PX: ORIF ANKLE FRACTURE: SHX5408

## 2013-01-02 LAB — POCT HEMOGLOBIN-HEMACUE: Hemoglobin: 12.5 g/dL (ref 12.0–15.0)

## 2013-01-02 SURGERY — OPEN REDUCTION INTERNAL FIXATION (ORIF) ANKLE FRACTURE
Anesthesia: Regional | Site: Ankle | Laterality: Left | Wound class: Clean

## 2013-01-02 SURGERY — OPEN REDUCTION INTERNAL FIXATION (ORIF) ANKLE FRACTURE
Anesthesia: Regional | Site: Ankle | Laterality: Left

## 2013-01-02 MED ORDER — LIDOCAINE HCL (CARDIAC) 20 MG/ML IV SOLN
INTRAVENOUS | Status: DC | PRN
  Administered 2013-01-02: 30 mg via INTRAVENOUS

## 2013-01-02 MED ORDER — PROPOFOL 10 MG/ML IV BOLUS
INTRAVENOUS | Status: DC | PRN
  Administered 2013-01-02: 120 mg via INTRAVENOUS

## 2013-01-02 MED ORDER — MIDAZOLAM HCL 5 MG/5ML IJ SOLN
INTRAMUSCULAR | Status: DC | PRN
  Administered 2013-01-02: 1 mg via INTRAVENOUS

## 2013-01-02 MED ORDER — MIDAZOLAM HCL 2 MG/2ML IJ SOLN
1.0000 mg | INTRAMUSCULAR | Status: DC | PRN
  Administered 2013-01-02: 2 mg via INTRAVENOUS

## 2013-01-02 MED ORDER — ONDANSETRON HCL 4 MG/2ML IJ SOLN
INTRAMUSCULAR | Status: DC | PRN
  Administered 2013-01-02: 4 mg via INTRAMUSCULAR

## 2013-01-02 MED ORDER — SODIUM CHLORIDE 0.9 % IV SOLN
INTRAVENOUS | Status: DC
  Administered 2013-01-02: 20 mL/h via INTRAVENOUS

## 2013-01-02 MED ORDER — METOCLOPRAMIDE HCL 5 MG PO TABS: 5.0000 mg | ORAL_TABLET | Freq: Three times a day (TID) | ORAL | Status: DC | PRN

## 2013-01-02 MED ORDER — FENTANYL CITRATE 0.05 MG/ML IJ SOLN
50.0000 ug | INTRAMUSCULAR | Status: DC | PRN
  Administered 2013-01-02: 100 ug via INTRAVENOUS

## 2013-01-02 MED ORDER — METOCLOPRAMIDE HCL 5 MG/ML IJ SOLN: 5.0000 mg | Freq: Three times a day (TID) | INTRAMUSCULAR | Status: DC | PRN

## 2013-01-02 MED ORDER — ENOXAPARIN SODIUM 40 MG/0.4ML ~~LOC~~ SOLN: 40.0000 mg | SUBCUTANEOUS | Status: DC

## 2013-01-02 MED ORDER — BUPIVACAINE HCL (PF) 0.5 % IJ SOLN
INTRAMUSCULAR | Status: DC | PRN
  Administered 2013-01-02: 10 mL

## 2013-01-02 MED ORDER — ONDANSETRON HCL 4 MG/2ML IJ SOLN: 4.0000 mg | Freq: Four times a day (QID) | INTRAMUSCULAR | Status: DC | PRN

## 2013-01-02 MED ORDER — BUPIVACAINE-EPINEPHRINE PF 0.5-1:200000 % IJ SOLN
INTRAMUSCULAR | Status: DC | PRN
  Administered 2013-01-02: 30 mL

## 2013-01-02 MED ORDER — DEXAMETHASONE SODIUM PHOSPHATE 10 MG/ML IJ SOLN
INTRAMUSCULAR | Status: DC | PRN
  Administered 2013-01-02: 4 mg via INTRAVENOUS

## 2013-01-02 MED ORDER — SENNA 8.6 MG PO TABS: 1.0000 | ORAL_TABLET | Freq: Two times a day (BID) | ORAL | Status: DC

## 2013-01-02 MED ORDER — ROXICODONE 5 MG PO TABS: 5.0000 mg | ORAL_TABLET | ORAL | Status: DC | PRN

## 2013-01-02 MED ORDER — FENTANYL CITRATE 0.05 MG/ML IJ SOLN
INTRAMUSCULAR | Status: DC | PRN
  Administered 2013-01-02: 25 ug via INTRAVENOUS

## 2013-01-02 MED ORDER — OXYCODONE HCL 5 MG PO TABS
5.0000 mg | ORAL_TABLET | ORAL | Status: DC | PRN
  Administered 2013-01-02 – 2013-01-03 (×2): 10 mg via ORAL

## 2013-01-02 MED ORDER — EPHEDRINE SULFATE 50 MG/ML IJ SOLN
INTRAMUSCULAR | Status: DC | PRN
  Administered 2013-01-02 (×2): 10 mg via INTRAVENOUS

## 2013-01-02 MED ORDER — SODIUM CHLORIDE 0.9 % IV SOLN: INTRAVENOUS | Status: DC

## 2013-01-02 MED ORDER — OXYCODONE HCL 5 MG PO TABS: 5.0000 mg | ORAL_TABLET | Freq: Once | ORAL | Status: AC | PRN

## 2013-01-02 MED ORDER — OXYCODONE HCL 5 MG/5ML PO SOLN: 5.0000 mg | Freq: Once | ORAL | Status: AC | PRN

## 2013-01-02 MED ORDER — BACITRACIN ZINC 500 UNIT/GM EX OINT
TOPICAL_OINTMENT | CUTANEOUS | Status: DC | PRN
  Administered 2013-01-02: 1 via TOPICAL

## 2013-01-02 MED ORDER — DOCUSATE SODIUM 100 MG PO CAPS: 100.0000 mg | ORAL_CAPSULE | Freq: Two times a day (BID) | ORAL | Status: DC

## 2013-01-02 MED ORDER — HYDROMORPHONE HCL PF 1 MG/ML IJ SOLN
0.2500 mg | INTRAMUSCULAR | Status: DC | PRN
Start: 2013-01-02 — End: 2013-01-03

## 2013-01-02 MED ORDER — LACTATED RINGERS IV SOLN
INTRAVENOUS | Status: DC
  Administered 2013-01-02 (×2): via INTRAVENOUS

## 2013-01-02 MED ORDER — MORPHINE SULFATE 2 MG/ML IJ SOLN: 1.0000 mg | INTRAMUSCULAR | Status: DC | PRN

## 2013-01-02 MED ORDER — CEFAZOLIN SODIUM-DEXTROSE 2-3 GM-% IV SOLR
2.0000 g | INTRAVENOUS | Status: AC
  Administered 2013-01-02: 2 g via INTRAVENOUS

## 2013-01-02 MED ORDER — CHLORHEXIDINE GLUCONATE 4 % EX LIQD: 60.0000 mL | Freq: Once | CUTANEOUS | Status: DC

## 2013-01-02 MED ORDER — ONDANSETRON HCL 4 MG PO TABS: 4.0000 mg | ORAL_TABLET | Freq: Four times a day (QID) | ORAL | Status: DC | PRN

## 2013-01-02 SURGICAL SUPPLY — 83 items
BANDAGE ESMARK 6X9 LF (GAUZE/BANDAGES/DRESSINGS) ×1 IMPLANT
BIT DRILL 2.5X2.75 QC CALB (BIT) ×2 IMPLANT
BIT DRILL CALIBRATED 2.7 (BIT) ×2 IMPLANT
BLADE SURG 15 STRL LF DISP TIS (BLADE) ×2 IMPLANT
BLADE SURG 15 STRL SS (BLADE) ×2
BNDG COHESIVE 4X5 TAN STRL (GAUZE/BANDAGES/DRESSINGS) ×2 IMPLANT
BNDG COHESIVE 6X5 TAN STRL LF (GAUZE/BANDAGES/DRESSINGS) ×2 IMPLANT
BNDG ESMARK 4X9 LF (GAUZE/BANDAGES/DRESSINGS) IMPLANT
BNDG ESMARK 6X9 LF (GAUZE/BANDAGES/DRESSINGS) ×2
BRUSH SCRUB EZ PLAIN DRY (MISCELLANEOUS) ×2 IMPLANT
CANISTER SUCTION 1200CC (MISCELLANEOUS) ×2 IMPLANT
CHLORAPREP W/TINT 26ML (MISCELLANEOUS) ×2 IMPLANT
CLOTH BEACON ORANGE TIMEOUT ST (SAFETY) ×2 IMPLANT
COVER TABLE BACK 60X90 (DRAPES) ×2 IMPLANT
CUFF TOURNIQUET SINGLE 34IN LL (TOURNIQUET CUFF) ×2 IMPLANT
DECANTER SPIKE VIAL GLASS SM (MISCELLANEOUS) IMPLANT
DRAPE C-ARM 42X72 X-RAY (DRAPES) ×2 IMPLANT
DRAPE C-ARMOR (DRAPES) ×2 IMPLANT
DRAPE EXTREMITY T 121X128X90 (DRAPE) ×2 IMPLANT
DRAPE U-SHAPE 47X51 STRL (DRAPES) ×2 IMPLANT
DRSG ADAPTIC 3X8 NADH LF (GAUZE/BANDAGES/DRESSINGS) IMPLANT
DRSG EMULSION OIL 3X3 NADH (GAUZE/BANDAGES/DRESSINGS) ×6 IMPLANT
DRSG PAD ABDOMINAL 8X10 ST (GAUZE/BANDAGES/DRESSINGS) ×4 IMPLANT
ELECT REM PT RETURN 9FT ADLT (ELECTROSURGICAL) ×2
ELECTRODE REM PT RTRN 9FT ADLT (ELECTROSURGICAL) ×1 IMPLANT
GLOVE BIO SURGEON STRL SZ8 (GLOVE) ×2 IMPLANT
GLOVE BIOGEL PI IND STRL 7.0 (GLOVE) ×1 IMPLANT
GLOVE BIOGEL PI IND STRL 7.5 (GLOVE) ×1 IMPLANT
GLOVE BIOGEL PI IND STRL 8 (GLOVE) ×1 IMPLANT
GLOVE BIOGEL PI INDICATOR 7.0 (GLOVE) ×1
GLOVE BIOGEL PI INDICATOR 7.5 (GLOVE) ×1
GLOVE BIOGEL PI INDICATOR 8 (GLOVE) ×1
GLOVE ECLIPSE 6.5 STRL STRAW (GLOVE) ×4 IMPLANT
GLOVE ECLIPSE 7.0 STRL STRAW (GLOVE) ×2 IMPLANT
GLOVE EXAM NITRILE MD LF STRL (GLOVE) IMPLANT
GOWN PREVENTION PLUS XLARGE (GOWN DISPOSABLE) ×6 IMPLANT
GOWN PREVENTION PLUS XXLARGE (GOWN DISPOSABLE) ×2 IMPLANT
K-WIRE ACE 1.6X6 (WIRE) ×4
KWIRE ACE 1.6X6 (WIRE) ×2 IMPLANT
NEEDLE HYPO 22GX1.5 SAFETY (NEEDLE) IMPLANT
NS IRRIG 1000ML POUR BTL (IV SOLUTION) ×2 IMPLANT
PACK BASIN DAY SURGERY FS (CUSTOM PROCEDURE TRAY) ×2 IMPLANT
PAD CAST 4YDX4 CTTN HI CHSV (CAST SUPPLIES) ×1 IMPLANT
PADDING CAST ABS 4INX4YD NS (CAST SUPPLIES) ×1
PADDING CAST ABS COTTON 4X4 ST (CAST SUPPLIES) ×1 IMPLANT
PADDING CAST COTTON 4X4 STRL (CAST SUPPLIES) ×1
PADDING CAST COTTON 6X4 STRL (CAST SUPPLIES) ×2 IMPLANT
PENCIL BUTTON HOLSTER BLD 10FT (ELECTRODE) ×2 IMPLANT
PLATE FIBULAR COMP LOCK 10H (Plate) ×2 IMPLANT
SANITIZER HAND PURELL 535ML FO (MISCELLANEOUS) ×2 IMPLANT
SCREW ACE CAN 4.0 30M (Screw) ×2 IMPLANT
SCREW ACE CAN 4.0 40M (Screw) ×2 IMPLANT
SCREW ACE CAN 4.0 44M (Screw) ×2 IMPLANT
SCREW CORTICAL LOW PROF 3.5X20 (Screw) ×2 IMPLANT
SCREW LOCK CORT STAR 3.5X10 (Screw) ×2 IMPLANT
SCREW LOCK CORT STAR 3.5X12 (Screw) ×4 IMPLANT
SCREW LOCK CORT STAR 3.5X16 (Screw) ×2 IMPLANT
SCREW LOW PROFILE 12MMX3.5MM (Screw) ×2 IMPLANT
SCREW NON LOCKING LP 3.5 14MM (Screw) ×4 IMPLANT
SHEET MEDIUM DRAPE 40X70 STRL (DRAPES) ×2 IMPLANT
SLEEVE SCD COMPRESS KNEE MED (MISCELLANEOUS) ×2 IMPLANT
SPLINT FAST PLASTER 5X30 (CAST SUPPLIES) ×20
SPLINT PLASTER CAST FAST 5X30 (CAST SUPPLIES) ×20 IMPLANT
SPONGE GAUZE 4X4 12PLY (GAUZE/BANDAGES/DRESSINGS) ×2 IMPLANT
SPONGE LAP 18X18 X RAY DECT (DISPOSABLE) ×2 IMPLANT
STOCKINETTE 6  STRL (DRAPES) ×1
STOCKINETTE 6 STRL (DRAPES) ×1 IMPLANT
SUCTION FRAZIER TIP 10 FR DISP (SUCTIONS) ×2 IMPLANT
SUT ETHILON 3 0 PS 1 (SUTURE) ×2 IMPLANT
SUT FIBERWIRE #2 38 T-5 BLUE (SUTURE)
SUT MNCRL AB 3-0 PS2 18 (SUTURE) ×4 IMPLANT
SUT VIC AB 0 SH 27 (SUTURE) ×2 IMPLANT
SUT VIC AB 2-0 SH 27 (SUTURE)
SUT VIC AB 2-0 SH 27XBRD (SUTURE) IMPLANT
SUT VICRYL 4-0 PS2 18IN ABS (SUTURE) IMPLANT
SUTURE FIBERWR #2 38 T-5 BLUE (SUTURE) IMPLANT
SYR BULB 3OZ (MISCELLANEOUS) ×4 IMPLANT
SYR CONTROL 10ML LL (SYRINGE) IMPLANT
TOWEL OR 17X24 6PK STRL BLUE (TOWEL DISPOSABLE) ×6 IMPLANT
TOWEL OR NON WOVEN STRL DISP B (DISPOSABLE) ×2 IMPLANT
TRAY DSU PREP LF (CUSTOM PROCEDURE TRAY) IMPLANT
TUBE CONNECTING 20X1/4 (TUBING) ×2 IMPLANT
UNDERPAD 30X30 INCONTINENT (UNDERPADS AND DIAPERS) ×4 IMPLANT

## 2013-01-02 NOTE — Progress Notes (Signed)
Assisted Dr. Fitzgerald with left, ultrasound guided, popliteal/saphenous block. Side rails up, monitors on throughout procedure. See vital signs in flow sheet. Tolerated Procedure well. 

## 2013-01-02 NOTE — Transfer of Care (Signed)
Immediate Anesthesia Transfer of Care Note  Patient: Crystal Thornton  Procedure(s) Performed: Procedure(s): LEFT ANKLE REMOVAL OF EXTERNAL FIXATOR OPEN REDUCTION INTERNAL FIXATION (ORIF) TRIMALLEOLAR FRACTURE (Left)  Patient Location: PACU  Anesthesia Type:GA combined with regional for post-op pain  Level of Consciousness: awake, alert , oriented and patient cooperative  Airway & Oxygen Therapy: Patient Spontanous Breathing and Patient connected to face mask oxygen  Post-op Assessment: Report given to PACU RN and Post -op Vital signs reviewed and stable  Post vital signs: Reviewed and stable  Complications: No apparent anesthesia complications

## 2013-01-02 NOTE — Anesthesia Procedure Notes (Addendum)
Anesthesia Regional Block:  Popliteal block  Pre-Anesthetic Checklist: ,, timeout performed, Correct Patient, Correct Site, Correct Laterality, Correct Procedure, Correct Position, site marked, Risks and benefits discussed, pre-op evaluation, post-op pain management  Laterality: Left  Prep: Maximum Sterile Barrier Precautions used and chloraprep       Needles:  Injection technique: Single-shot  Needle Type: Echogenic Stimulator Needle      Needle Gauge: 21 and 21 G    Additional Needles:  Procedures: ultrasound guided (picture in chart) and nerve stimulator Popliteal block  Nerve Stimulator or Paresthesia:  Response: Peroneal,  Response: Tibial,   Additional Responses:   Narrative:  Start time: 01/02/2013 11:58 AM End time: 01/02/2013 12:11 PM Injection made incrementally with aspirations every 5 mL. Anesthesiologist: Sampson Goon, MD  Additional Notes: 2% Lidocaine skin wheel. Saphenous block with 10cc of 0.5% Bupivicaine plain.  Popliteal block Procedure Name: LMA Insertion Date/Time: 01/02/2013 1:12 PM Performed by: [name] Pre-anesthesia Checklist: Patient identified, Emergency Drugs available, Suction available and Patient being monitored Patient Re-evaluated:Patient Re-evaluated prior to inductionOxygen Delivery Method: Circle System Utilized Preoxygenation: Pre-oxygenation with 100% oxygen Intubation Type: IV induction Ventilation: Mask ventilation without difficulty LMA: LMA inserted LMA Size: 4.0 Number of attempts: 1 Airway Equipment and Method: bite block Placement Confirmation: positive ETCO2 Tube secured with: Tape Dental Injury: Teeth and Oropharynx as per pre-operative assessment

## 2013-01-02 NOTE — Anesthesia Postprocedure Evaluation (Signed)
  Anesthesia Post-op Note  Patient: Crystal Thornton  Procedure(s) Performed: Procedure(s): LEFT ANKLE REMOVAL OF EXTERNAL FIXATOR OPEN REDUCTION INTERNAL FIXATION (ORIF) TRIMALLEOLAR FRACTURE (Left)  Patient Location: PACU  Anesthesia Type:GA combined with regional for post-op pain  Level of Consciousness: awake and alert   Airway and Oxygen Therapy: Patient Spontanous Breathing  Post-op Pain: none  Post-op Assessment: Post-op Vital signs reviewed, Patient's Cardiovascular Status Stable, Respiratory Function Stable, Patent Airway and No signs of Nausea or vomiting  Post-op Vital Signs: Reviewed and stable  Complications: No apparent anesthesia complications

## 2013-01-02 NOTE — Brief Op Note (Signed)
01/02/2013  3:05 PM  PATIENT:  Wynelle Cleveland  67 y.o. female  PRE-OPERATIVE DIAGNOSIS:  Left ankle trimalleolar fracture s/p closed reduction and external fixation  POST-OPERATIVE DIAGNOSIS:  same  Procedure(s): 1.  Removal of left lower extremity external fixator under anesthesia 2.  ORIF of left bimalleolar ankle fracture without fixation of the posterior malleolus  SURGEON:  [name], MD  ASSISTANT: Lorin Picket Flowers, PA-C  ANESTHESIA:   General, regional  EBL:  minimal   TOURNIQUET:   Total Tourniquet Time Documented: Thigh (Left) - 66 minutes Total: Thigh (Left) - 66 minutes   COMPLICATIONS:  None apparent  DISPOSITION:  Extubated, awake and stable to recovery.  DICTATION ID:  [id number]

## 2013-01-02 NOTE — Anesthesia Preprocedure Evaluation (Addendum)
Anesthesia Evaluation  Patient identified by MRN, date of birth, ID band Patient awake    Reviewed: Allergy & Precautions, NPO status   Airway Mallampati: II TM Distance: >3 FB Neck ROM: Full    Dental  (+) Teeth Intact and Dental Advisory Given   Pulmonary  breath sounds clear to auscultation        Cardiovascular hypertension, Pt. on medications Rhythm:Regular Rate:Normal     Neuro/Psych PSYCHIATRIC DISORDERS Anxiety Depression    GI/Hepatic (+) Hepatitis -, B and C  Endo/Other  diabetes, Type 2, Oral Hypoglycemic Agents  Renal/GU      Musculoskeletal   Abdominal   Peds  Hematology   Anesthesia Other Findings   Reproductive/Obstetrics                          Anesthesia Physical Anesthesia Plan  ASA: III  Anesthesia Plan: General and Regional   Post-op Pain Management:    Induction: Intravenous  Airway Management Planned: LMA  Additional Equipment:   Intra-op Plan:   Post-operative Plan: Extubation in OR  Informed Consent: I have reviewed the patients History and Physical, chart, labs and discussed the procedure including the risks, benefits and alternatives for the proposed anesthesia with the patient or authorized representative who has indicated his/her understanding and acceptance.   Dental advisory given  Plan Discussed with: CRNA  Anesthesia Plan Comments:         Anesthesia Quick Evaluation

## 2013-01-02 NOTE — H&P (Signed)
Crystal Thornton is an 67 y.o. female.   Chief Complaint:  Left ankle injury HPI: 67 y/o female with PMH of hepatitis B and C injured her left ankle  3 weeks ago.  She underwent closed reduction and external fixation to allow massive fracture blisters to heal.  She presents now for removal of the ex fix and ORIF of her unstable fractures.  She continues to smoke despite my instructions to quit.  Past Medical History  Diagnosis Date  . Depression   . Diabetes mellitus   . Hyperlipidemia   . Hypertension   . Osteopenia   . Anxiety   . Pain     R. shoulder and lower back and legs  . Hepatitis C     Treated and no longer has  . ANGIOEDEMA 01/24/2010  . ANXIETY 11/07/2006  . BACK PAIN 11/01/2007  . Cough 05/29/2008  . CYST, OVARIAN NEC/NOS 11/05/2006  . DEPRESSION 11/07/2006  . DIABETES MELLITUS, TYPE II 11/07/2006  . DIVERTICULOSIS, COLON 11/07/2006  . FIBROCYSTIC BREAST DISEASE 11/05/2006  . HEAD TRAUMA, CLOSED 01/24/2010  . HEPATITIS B, HX OF 11/05/2006  . HEPATITIS C 11/05/2006  . HYPERLIPIDEMIA 11/07/2006  . HYPERTENSION 05/29/2007  . INSOMNIA, HX OF 11/05/2006  . LOW BACK PAIN 05/29/2007  . OSTEOPENIA 11/07/2006  . Other specified sites of sprains and strains 08/31/2008  . PULMONARY NODULE, LEFT UPPER LOBE 05/31/2009  . SHOULDER PAIN, RIGHT, CHRONIC 06/02/2010  . SINUSITIS- ACUTE-NOS 12/10/2007  . SKIN LESION 06/02/2010  . TOTAL ABDOMINAL HYSTERECTOMY, HX OF 03/27/1990  . Wears glasses   . Wears dentures     full top    Past Surgical History  Procedure Laterality Date  . Tubal ligation    . Cesarean section    . Shoulder arthroscopy  2008    left  . Ovarian cyst surgery      ruptured  . Appendectomy    . Colonoscopy    . Liver biopsy    . Cervical fusion  2013  . Orif ankle fracture Left 12/03/2012  . External fixation leg Left 12/12/2012    Procedure: LEFT ADJUSTMENT OF EXTERNAL FIXATION UNDER ANESTHESIA;  Surgeon: [name], MD;  Location: [facility];  Service:  Orthopedics;  Laterality: Left;    Family History  Problem Relation Age of Onset  . Liver cancer Mother    Social History:  reports that she has been smoking Cigarettes.  She has been smoking about 0.25 packs per day. She has never used smokeless tobacco. She reports that she does not drink alcohol or use illicit drugs.  Allergies:  Allergies  Allergen Reactions  . Naproxen Sodium Anaphylaxis  . Sulfonamide Derivatives Other (See Comments)    Causes nephritis  . Ace Inhibitors Swelling    Lip swelling    No prescriptions prior to admission    No results found for this or any previous visit (from the past 48 hour(s)). No results found.  ROS  No recent f/c/n/v/wt loss.  Height 5\' 7"  (1.702 m), weight 68.04 kg (150 lb). Physical Exam  Elderly female appearing older than her stated age.  A and O x 4.  Mood and affect normal.  EOMI.  Resp unlabored.  L ankle with ex fix in place.  Skin healthy and intact with minimal swelling.  No gross deformity.  Sens to LT intact.  5/5 strength in PF and DF of the toes.    Assessment/Plan Left ankle fracture dislocation s/p closed reduction and external  fixation - to OR for removal of ex fix and ORIF of the fractures.  The risks and benefits of the alternative treatment options have been discussed in detail.  The patient wishes to proceed with surgery and specifically understands risks of bleeding, infection, nerve damage, blood clots, need for additional surgery, amputation and death.   Crystal Thornton 28-Jan-2013, 10:15 AM

## 2013-01-03 ENCOUNTER — Encounter (HOSPITAL_BASED_OUTPATIENT_CLINIC_OR_DEPARTMENT_OTHER): Payer: Self-pay | Admitting: Orthopedic Surgery

## 2013-01-03 DIAGNOSIS — S82853A Displaced trimalleolar fracture of unspecified lower leg, initial encounter for closed fracture: Secondary | ICD-10-CM | POA: Diagnosis not present

## 2013-01-03 NOTE — Op Note (Signed)
NAMEBRITTONY, BILLICK NO.:  [id number]  MEDICAL RECORD NO.:  [id number]  LOCATION:                               FACILITY:  [facility]  PHYSICIAN:  [name], MD        DATE OF BIRTH:  1945/08/19  DATE OF PROCEDURE:  01/02/2013 DATE OF DISCHARGE:  01/03/2013                              OPERATIVE REPORT   POSTOPERATIVE DIAGNOSES:  Left ankle trimalleolar fracture, status post closed reduction and external fixation.  POSTOPERATIVE DIAGNOSIS:  Left ankle trimalleolar fracture, status post closed reduction and external fixation.  PROCEDURES: 1. Removal of left lower extremity external fixator under anesthesia. 2. Open reduction and internal fixation of left bimalleolar ankle     fracture without fixation of the posterior malleolus fracture.  SURGEON:  [name], MD  FIRST ASSISTANT:  Lorin Picket Flowers, PA-C.  ANESTHESIA:  General, regional.  ESTIMATED BLOOD LOSS:  Minimal.  TOURNIQUET TIME:  66 minutes at 220 mmHg.  COMPLICATIONS:  None apparent.  DISPOSITION:  Extubated, awake and stable to recovery.  INDICATIONS FOR PROCEDURE:  The patient is a 67 year old woman with past medical history significant for smoking.  She injured her left ankle about a month ago.  She was seen in clinic with severe fracture blisters and significant subluxation of the fracture.  She underwent closed reduction and application of an external fixator.  This required adjustment of the fixator a week later.  She presents now for definitive treatment having healed all of her fracture blisters.  She understands the risks and benefits, the alternative treatment options and elects surgical treatment.  She specifically understands the risks of bleeding, infection, nerve damage, blood clots, need for additional surgery, amputation, and death.  PROCEDURE IN DETAIL:  After preoperative consent was obtained and the correct operative site was identified, the patient was brought to  the operating room and placed supine on the operating table.  General anesthesia was induced.  Preoperative antibiotics were administered. Surgical time-out was taken.  The left lower extremity external fixator was then loosened and removed.  The Schanz pins in the tibia were both removed in their entirety.  The pin through the calcaneus was also removed in its entirety.  The left lower extremity was then prepped and draped in standard sterile fashion with a tourniquet around the thigh. The extremity was exsanguinated and the tourniquet was inflated to 250 mmHg.  A longitudinal incision was then made over the lateral malleolus. Sharp dissection was carried down through the skin and subcutaneous tissue.  The fracture site was identified.  It was cleaned of all hematoma and fibrous tissue, and freed up to mobilize.  Attention was then turned to the medial malleolus.  Sharp dissection was carried down through the skin and subcutaneous tissue.  The fracture site was identified.  It was cleaned of all periosteum and fibrous tissue.  Fracture was reduced.  Two K-wires were inserted through the tip of the medial malleolus and to the metaphyseal bone of the distal tibia.  These were noted to have appropriate alignment on AP and lateral fluoroscopy and appropriate reduction was noted of the medial malleolus fracture site.  Two cannulated 4-mm screws were  placed over these guidepins holding the fracture in a reduced and compressed position.  Attention was then returned to the lateral malleolus.  The 10-hole locking plate from the [employer] fibula plate set was selected.  It was fixed distally with three locking screws.  The fracture was then pulled out to length with the assistance of the plate and provisionally pinned.  AP and lateral fluoroscopic images confirmed appropriate position of the plate and appropriate reduction of the fracture.  The plate was then secured proximally with two  nonlocking and one locking screw, leaving a gap over the comminuted area centrally.  The bone graft from the fracture site was then packed back into the void.  This was done after irrigating the wound copiously.  Final AP, lateral, and mortise views of the ankle showed appropriate reduction of the fracture and appropriate position and length of all hardware.  Inverted simple sutures of 0 Vicryl were used to close this periosteum over the plate laterally.  Inverted simple sutures of 3-0 Monocryl were used to close the subcutaneous tissue and running 3-0 nylon was used to close the incision both medially and laterally.  Sterile dressings were applied followed by a well-padded short-leg splint.  Tourniquet was released at 66 minutes after application of the dressings.  The patient was awakened by anesthesia and transported to the recovery room in stable condition.  FOLLOWUP PLAN:  The patient to be nonweightbearing on the left lower extremity.  She will be observed overnight for pain control.  She will follow up with me in the office in 2 weeks for suture removal and conversion to a cast.  Scott Flowers, PA-C was present and scrubbed for the duration of the case.  His assistance was critical in gaining and maintaining exposure, performing the operation, closing the wounds, applying dressings, and splint.     [name], MD     JH/MEDQ  D:  01/02/2013  T:  01/03/2013  Job:  [id number]

## 2013-01-30 ENCOUNTER — Other Ambulatory Visit: Payer: Self-pay

## 2013-02-07 ENCOUNTER — Other Ambulatory Visit: Payer: Self-pay | Admitting: Internal Medicine

## 2013-02-07 NOTE — Telephone Encounter (Signed)
Done hardcopy to [facility]  

## 2013-02-10 ENCOUNTER — Other Ambulatory Visit: Payer: Self-pay | Admitting: Internal Medicine

## 2013-02-10 NOTE — Telephone Encounter (Signed)
rx was sent into pharmacy, 11.14.14

## 2013-02-10 NOTE — Telephone Encounter (Signed)
Pt is ware, will come pick this up.

## 2013-02-11 MED ORDER — ALPRAZOLAM 1 MG PO TABS: ORAL_TABLET | ORAL | Status: DC

## 2013-02-11 NOTE — Telephone Encounter (Signed)
Done hardcopy to [facility]  

## 2013-02-11 NOTE — Telephone Encounter (Signed)
Did not received hardcopy 

## 2013-02-11 NOTE — Telephone Encounter (Signed)
Faxed script back to [facility].../lmb 

## 2013-05-29 ENCOUNTER — Other Ambulatory Visit: Payer: Self-pay | Admitting: Internal Medicine

## 2013-05-29 DIAGNOSIS — Z1231 Encounter for screening mammogram for malignant neoplasm of breast: Secondary | ICD-10-CM

## 2013-06-03 ENCOUNTER — Ambulatory Visit (HOSPITAL_COMMUNITY)
Admission: RE | Admit: 2013-06-03 | Discharge: 2013-06-03 | Disposition: A | Discharge status: Home / Self Care | Payer: Medicare HMO | Source: Ambulatory Visit | Attending: Internal Medicine | Admitting: Internal Medicine

## 2013-06-03 DIAGNOSIS — Z1231 Encounter for screening mammogram for malignant neoplasm of breast: Secondary | ICD-10-CM | POA: Insufficient documentation

## 2013-06-03 LAB — HM MAMMOGRAPHY

## 2013-07-01 ENCOUNTER — Other Ambulatory Visit: Payer: Self-pay

## 2013-07-01 MED ORDER — SERTRALINE HCL 100 MG PO TABS: 100.0000 mg | ORAL_TABLET | Freq: Every day | ORAL | Status: DC

## 2013-07-01 MED ORDER — ATENOLOL 50 MG PO TABS: 50.0000 mg | ORAL_TABLET | Freq: Every day | ORAL | Status: DC

## 2013-07-23 ENCOUNTER — Encounter: Payer: Self-pay | Admitting: Internal Medicine

## 2013-07-23 ENCOUNTER — Ambulatory Visit (INDEPENDENT_AMBULATORY_CARE_PROVIDER_SITE_OTHER): Payer: Commercial Managed Care - HMO | Admitting: Internal Medicine

## 2013-07-23 VITALS — BP 122/80 | HR 68 | Temp 98.3°F | Ht 67.5 in | Wt 141.0 lb

## 2013-07-23 DIAGNOSIS — E119 Type 2 diabetes mellitus without complications: Secondary | ICD-10-CM

## 2013-07-23 DIAGNOSIS — Z0000 Encounter for general adult medical examination without abnormal findings: Secondary | ICD-10-CM

## 2013-07-23 DIAGNOSIS — F411 Generalized anxiety disorder: Secondary | ICD-10-CM

## 2013-07-23 DIAGNOSIS — Z23 Encounter for immunization: Secondary | ICD-10-CM

## 2013-07-23 MED ORDER — SERTRALINE HCL 100 MG PO TABS: ORAL_TABLET | ORAL | Status: DC

## 2013-07-23 MED ORDER — PRAVASTATIN SODIUM 40 MG PO TABS: 80.0000 mg | ORAL_TABLET | Freq: Every day | ORAL | Status: DC

## 2013-07-23 MED ORDER — ATENOLOL 50 MG PO TABS
50.0000 mg | ORAL_TABLET | Freq: Every day | ORAL | Status: DC
Start: 2013-07-23 — End: 2014-08-12

## 2013-07-23 MED ORDER — TIZANIDINE HCL 2 MG PO TABS: 2.0000 mg | ORAL_TABLET | Freq: Two times a day (BID) | ORAL | Status: DC | PRN

## 2013-07-23 MED ORDER — ALPRAZOLAM 1 MG PO TABS: ORAL_TABLET | ORAL | Status: DC

## 2013-07-23 MED ORDER — METFORMIN HCL 500 MG PO TABS: 1000.0000 mg | ORAL_TABLET | Freq: Every day | ORAL | Status: DC

## 2013-07-23 NOTE — Assessment & Plan Note (Signed)
stable overall by history and exam, recent data reviewed with pt, and pt to continue medical treatment as before,  to f/u any worsening symptoms or concerns Lab Results  Component Value Date   WBC 7.5 06/10/2012   HGB 12.5 01/02/2013   HCT 41.8 06/10/2012   PLT 259.0 06/10/2012   GLUCOSE 161* 12/11/2012   CHOL 141 06/10/2012   TRIG 62.0 06/10/2012   HDL 48.10 06/10/2012   LDLDIRECT 144.1 04/27/2006   LDLCALC 81 06/10/2012   ALT 17 06/10/2012   AST 19 06/10/2012   NA 140 12/11/2012   K 5.4* 12/11/2012   CL 99 12/11/2012   CREATININE 0.57 12/11/2012   BUN 19 12/11/2012   CO2 29 12/11/2012   TSH 0.40 06/10/2012   HGBA1C 6.4 06/10/2012   MICROALBUR 2.7* 06/10/2012   For med refill as before, except ok to increase the zoloft to 150 qd

## 2013-07-23 NOTE — Progress Notes (Signed)
Pre visit review using our clinic review tool, if applicable. No additional management support is needed unless otherwise documented below in the visit note. 

## 2013-07-23 NOTE — Progress Notes (Signed)
Subjective:    Patient ID: Crystal Thornton, female    DOB: 29-Jun-1945, 69 y.o.   MRN: [id number]  HPI  Here for wellness and f/u;  Overall doing ok;  Pt denies CP, worsening SOB, DOE, wheezing, orthopnea, PND, worsening LE edema, palpitations, dizziness or syncope.  Pt denies neurological change such as new headache, facial or extremity weakness.  Pt denies polydipsia, polyuria, or low sugar symptoms. Pt states overall good compliance with treatment and medications, good tolerability, and has been trying to follow lower cholesterol diet.  Pt denies worsening depressive symptoms, suicidal ideation or panic. No fever, night sweats, wt loss, loss of appetite, or other constitutional symptoms.  Pt states good ability with ADL's, has low fall risk, home safety reviewed and adequate, no other significant changes in hearing or vision, and only occasionally active with exercise.  S/p left ankle fx sp surgury sept 2015, still with some pain, on pain medication- oxycodone 30  - 5 times daily, with intent to wean hopefully.  Lost over 20 lbs since the accident.    Asks for increased xanax to qid, and percocet prn on top of her oxycodone, but I respectfully declines, should stick with one MD for her pain meds Past Medical History  Diagnosis Date  . Depression   . Diabetes mellitus   . Hyperlipidemia   . Hypertension   . Osteopenia   . Anxiety   . Pain     R. shoulder and lower back and legs  . Hepatitis C     Treated and no longer has  . ANGIOEDEMA 01/24/2010  . ANXIETY 11/07/2006  . BACK PAIN 11/01/2007  . Cough 05/29/2008  . CYST, OVARIAN NEC/NOS 11/05/2006  . DEPRESSION 11/07/2006  . DIABETES MELLITUS, TYPE II 11/07/2006  . DIVERTICULOSIS, COLON 11/07/2006  . FIBROCYSTIC BREAST DISEASE 11/05/2006  . HEAD TRAUMA, CLOSED 01/24/2010  . HEPATITIS B, HX OF 11/05/2006  . HEPATITIS C 11/05/2006  . HYPERLIPIDEMIA 11/07/2006  . HYPERTENSION 05/29/2007  . INSOMNIA, HX OF 11/05/2006  . LOW BACK PAIN 05/29/2007  .  OSTEOPENIA 11/07/2006  . Other specified sites of sprains and strains 08/31/2008  . PULMONARY NODULE, LEFT UPPER LOBE 05/31/2009  . SHOULDER PAIN, RIGHT, CHRONIC 06/02/2010  . SINUSITIS- ACUTE-NOS 12/10/2007  . SKIN LESION 06/02/2010  . TOTAL ABDOMINAL HYSTERECTOMY, HX OF 03/27/1990  . Wears glasses   . Wears dentures     full top   Past Surgical History  Procedure Laterality Date  . Tubal ligation    . Cesarean section    . Shoulder arthroscopy  2008    left  . Ovarian cyst surgery      ruptured  . Appendectomy    . Colonoscopy    . Liver biopsy    . Cervical fusion  2013  . Orif ankle fracture Left 12/03/2012  . External fixation leg Left 12/12/2012    Procedure: LEFT ADJUSTMENT OF EXTERNAL FIXATION UNDER ANESTHESIA;  Surgeon: Wylene Simmer, MD;  Location: [facility];  Service: Orthopedics;  Laterality: Left;  . Orif ankle fracture Left 01/02/2013    Procedure: LEFT ANKLE REMOVAL OF EXTERNAL FIXATOR OPEN REDUCTION INTERNAL FIXATION (ORIF) TRIMALLEOLAR FRACTURE;  Surgeon: Wylene Simmer, MD;  Location: [facility];  Service: Orthopedics;  Laterality: Left;    reports that she has been smoking Cigarettes.  She has been smoking about 0.25 packs per day. She has never used smokeless tobacco. She reports that she does not drink alcohol or use illicit  drugs. family history includes Liver cancer in her mother. Allergies  Allergen Reactions  . Naproxen Sodium Anaphylaxis  . Sulfonamide Derivatives Other (See Comments)    Causes nephritis  . Ace Inhibitors Swelling    Lip swelling   Current Outpatient Prescriptions on File Prior to Visit  Medication Sig Dispense Refill  . ALPRAZolam (XANAX) 1 MG tablet TAKE 1 TABLET BY MOUTH 3 TIMES DAILY AS NEEDED  90 tablet  2  . aspirin EC 325 MG tablet Take 325 mg by mouth daily.      Marland Kitchen atenolol (TENORMIN) 50 MG tablet Take 1 tablet (50 mg total) by mouth daily.  90 tablet  0  . B Complex-C (B-COMPLEX WITH VITAMIN C) tablet Take 1  tablet by mouth daily.      Marland Kitchen GINKGO BILOBA COMPLEX PO Take 1 tablet by mouth daily.      . Ginseng 500 MG CAPS Take 1 capsule by mouth daily.      . metFORMIN (GLUCOPHAGE) 500 MG tablet Take 1,000 mg by mouth daily with breakfast.      . Multiple Vitamins-Minerals (CENTRUM SILVER PO) Take 1 tablet by mouth daily.        Marland Kitchen oxycodone (ROXICODONE) 30 MG immediate release tablet Take 30 mg by mouth 5 (five) times daily as needed for pain.       . pravastatin (PRAVACHOL) 40 MG tablet Take 80 mg by mouth daily.      . sertraline (ZOLOFT) 100 MG tablet Take 1 tablet (100 mg total) by mouth daily.  90 tablet  0  . tiZANidine (ZANAFLEX) 2 MG tablet Take 2 mg by mouth 2 (two) times daily as needed (for muscle relaxation / sleep).        No current facility-administered medications on file prior to visit.    Review of Systems Constitutional: Negative for increased diaphoresis, other activity, appetite or other siginficant weight change  HENT: Negative for worsening hearing loss, ear pain, facial swelling, mouth sores and neck stiffness.   Eyes: Negative for other worsening pain, redness or visual disturbance.  Respiratory: Negative for shortness of breath and wheezing.   Cardiovascular: Negative for chest pain and palpitations.  Gastrointestinal: Negative for diarrhea, blood in stool, abdominal distention or other pain Genitourinary: Negative for hematuria, flank pain or change in urine volume.  Musculoskeletal: Negative for myalgias or other joint complaints.  Skin: Negative for color change and wound.  Neurological: Negative for syncope and numbness. other than noted Hematological: Negative for adenopathy. or other swelling Psychiatric/Behavioral: Negative for hallucinations, self-injury, decreased concentration or other worsening agitation.      Objective:   Physical Exam BP 122/80  Pulse 68  Temp(Src) 98.3 F (36.8 C) (Oral)  Ht 5' 7.5" (1.715 m)  Wt 141 lb (63.957 kg)  BMI 21.75 kg/m2   SpO2 96% VS noted,  Constitutional: Pt is oriented to person, place, and time. Appears well-developed and well-nourished.  Head: Normocephalic and atraumatic.  Right Ear: External ear normal.  Left Ear: External ear normal.  Nose: Nose normal.  Mouth/Throat: Oropharynx is clear and moist.  Eyes: Conjunctivae and EOM are normal. Pupils are equal, round, and reactive to light.  Neck: Normal range of motion. Neck supple. No JVD present. No tracheal deviation present.  Cardiovascular: Normal rate, regular rhythm, normal heart sounds and intact distal pulses.   Pulmonary/Chest: Effort normal and breath sounds without rales or wheezing  Abdominal: Soft. Bowel sounds are normal. NT. No HSM  Musculoskeletal: Normal range of motion.  Exhibits no edema.  Lymphadenopathy:  Has no cervical adenopathy.  Neurological: Pt is alert and oriented to person, place, and time. Pt has normal reflexes. No cranial nerve deficit. Motor grossly intact Skin: Skin is warm and dry. No rash noted.  Psychiatric:  Has mild nervous mood and affect. Behavior is normal.     Assessment & Plan:

## 2013-07-23 NOTE — Patient Instructions (Addendum)
You had the new Prevnar pneumonia shot today  OK to increase the zoloft to 150 mg per day  Please continue all other medications as before, and refills have been done if requested. - the xanax  Please have the pharmacy call with any other refills you may need.  Please continue your efforts at being more active, low cholesterol diet, and weight control. You are otherwise up to date with prevention measures today.  Please go to the LAB in the Basement (turn left off the elevator) for the tests to be done today  You will be contacted by phone if any changes need to be made immediately.  Otherwise, you will receive a letter about your results with an explanation, but please check with MyChart first.  Please remember to sign up for MyChart if you have not done so, as this will be important to you in the future with finding out test results, communicating by private email, and scheduling acute appointments online when needed.  Please stop smoking  Please return in 6 months, or sooner if needed, with Lab testing done 3-5 days before

## 2013-07-23 NOTE — Assessment & Plan Note (Signed)
stable overall by history and exam, recent data reviewed with pt, and pt to continue medical treatment as before,  to f/u any worsening symptoms or concerns Lab Results  Component Value Date   HGBA1C 6.4 06/10/2012

## 2013-07-23 NOTE — Assessment & Plan Note (Signed)

## 2013-07-23 NOTE — Addendum Note (Signed)
Addended by: Sharon Seller B on: 07/23/2013 10:29 AM   Modules accepted: Orders

## 2013-07-24 ENCOUNTER — Telehealth: Payer: Self-pay

## 2013-07-24 ENCOUNTER — Telehealth: Payer: Self-pay | Admitting: Internal Medicine

## 2013-07-24 NOTE — Telephone Encounter (Signed)
Relevant patient education assigned to patient using Emmi. ° °

## 2013-08-18 ENCOUNTER — Encounter: Payer: Self-pay | Admitting: Internal Medicine

## 2013-09-02 ENCOUNTER — Telehealth: Payer: Self-pay

## 2013-09-02 NOTE — Telephone Encounter (Signed)
Called the patient in response to message left on my phone on Monday 09/01/13.  Called to clarify what the patient needed, LM to call back

## 2013-09-03 NOTE — Telephone Encounter (Signed)
Returned call. Did receive message on September 02, 2013 from patient stating she had head lice.  PCP recommended using the OTC product.  Called the patient to inform left a detailed message.

## 2013-09-17 ENCOUNTER — Other Ambulatory Visit (INDEPENDENT_AMBULATORY_CARE_PROVIDER_SITE_OTHER): Payer: Commercial Managed Care - HMO

## 2013-09-17 DIAGNOSIS — Z0000 Encounter for general adult medical examination without abnormal findings: Secondary | ICD-10-CM

## 2013-09-17 DIAGNOSIS — E119 Type 2 diabetes mellitus without complications: Secondary | ICD-10-CM

## 2013-09-17 LAB — HEPATIC FUNCTION PANEL
ALK PHOS: 82 U/L (ref 39–117)
ALT: 15 U/L (ref 0–35)
AST: 21 U/L (ref 0–37)
Albumin: 4.3 g/dL (ref 3.5–5.2)
BILIRUBIN DIRECT: 0.1 mg/dL (ref 0.0–0.3)
BILIRUBIN TOTAL: 0.8 mg/dL (ref 0.2–1.2)
TOTAL PROTEIN: 7.4 g/dL (ref 6.0–8.3)

## 2013-09-17 LAB — URINALYSIS, ROUTINE W REFLEX MICROSCOPIC
Bilirubin Urine: NEGATIVE
Hgb urine dipstick: NEGATIVE
Ketones, ur: NEGATIVE
Leukocytes, UA: NEGATIVE
Nitrite: NEGATIVE
Specific Gravity, Urine: 1.025 (ref 1.000–1.030)
TOTAL PROTEIN, URINE-UPE24: NEGATIVE
URINE GLUCOSE: NEGATIVE
UROBILINOGEN UA: 0.2 (ref 0.0–1.0)
pH: 6 (ref 5.0–8.0)

## 2013-09-17 LAB — CBC WITH DIFFERENTIAL/PLATELET
BASOS ABS: 0 10*3/uL (ref 0.0–0.1)
Basophils Relative: 0.5 % (ref 0.0–3.0)
Eosinophils Absolute: 0.2 10*3/uL (ref 0.0–0.7)
Eosinophils Relative: 2.3 % (ref 0.0–5.0)
HCT: 45 % (ref 36.0–46.0)
Hemoglobin: 15.1 g/dL — ABNORMAL HIGH (ref 12.0–15.0)
LYMPHS PCT: 24 % (ref 12.0–46.0)
Lymphs Abs: 2.1 10*3/uL (ref 0.7–4.0)
MCHC: 33.7 g/dL (ref 30.0–36.0)
MCV: 92.6 fl (ref 78.0–100.0)
MONO ABS: 0.7 10*3/uL (ref 0.1–1.0)
Monocytes Relative: 7.7 % (ref 3.0–12.0)
NEUTROS PCT: 65.5 % (ref 43.0–77.0)
Neutro Abs: 5.6 10*3/uL (ref 1.4–7.7)
Platelets: 262 10*3/uL (ref 150.0–400.0)
RBC: 4.86 Mil/uL (ref 3.87–5.11)
RDW: 13.4 % (ref 11.5–15.5)
WBC: 8.6 10*3/uL (ref 4.0–10.5)

## 2013-09-17 LAB — LIPID PANEL
Cholesterol: 203 mg/dL — ABNORMAL HIGH (ref 0–200)
HDL: 53.6 mg/dL (ref >39.00)
LDL Cholesterol: 134 mg/dL — ABNORMAL HIGH (ref 0–99)
NONHDL: 149.4
Total CHOL/HDL Ratio: 4
Triglycerides: 75 mg/dL (ref 0.0–149.0)
VLDL: 15 mg/dL (ref 0.0–40.0)

## 2013-09-17 LAB — MICROALBUMIN / CREATININE URINE RATIO
Creatinine,U: 154.5 mg/dL
MICROALB UR: 4.1 mg/dL — AB (ref 0.0–1.9)
MICROALB/CREAT RATIO: 2.7 mg/g (ref 0.0–30.0)

## 2013-09-17 LAB — BASIC METABOLIC PANEL
BUN: 18 mg/dL (ref 6–23)
CHLORIDE: 105 meq/L (ref 96–112)
CO2: 29 meq/L (ref 19–32)
Calcium: 10 mg/dL (ref 8.4–10.5)
Creatinine, Ser: 0.9 mg/dL (ref 0.4–1.2)
GFR: 70.84 mL/min (ref >60.00)
Glucose, Bld: 202 mg/dL — ABNORMAL HIGH (ref 70–99)
Potassium: 5.8 mEq/L — ABNORMAL HIGH (ref 3.5–5.1)
SODIUM: 141 meq/L (ref 135–145)

## 2013-09-17 LAB — TSH: TSH: 0.67 u[IU]/mL (ref 0.35–4.50)

## 2013-09-17 LAB — HEMOGLOBIN A1C: Hgb A1c MFr Bld: 6.6 % — ABNORMAL HIGH (ref 4.6–6.5)

## 2014-01-01 ENCOUNTER — Other Ambulatory Visit: Payer: Self-pay

## 2014-01-01 MED ORDER — ALPRAZOLAM 1 MG PO TABS: ORAL_TABLET | ORAL | Status: DC

## 2014-01-01 NOTE — Telephone Encounter (Signed)
Done hardcopy to [facility]  

## 2014-01-01 NOTE — Telephone Encounter (Signed)
Faxed hardcopy for Alprazolam to [facility] 

## 2014-01-21 ENCOUNTER — Encounter: Payer: Self-pay | Admitting: Internal Medicine

## 2014-01-21 ENCOUNTER — Ambulatory Visit (INDEPENDENT_AMBULATORY_CARE_PROVIDER_SITE_OTHER): Payer: Commercial Managed Care - HMO | Admitting: Internal Medicine

## 2014-01-21 VITALS — BP 132/80 | HR 65 | Temp 98.3°F | Ht 67.5 in | Wt 142.5 lb

## 2014-01-21 DIAGNOSIS — Z23 Encounter for immunization: Secondary | ICD-10-CM

## 2014-01-21 DIAGNOSIS — E785 Hyperlipidemia, unspecified: Secondary | ICD-10-CM

## 2014-01-21 DIAGNOSIS — E119 Type 2 diabetes mellitus without complications: Secondary | ICD-10-CM

## 2014-01-21 DIAGNOSIS — I10 Essential (primary) hypertension: Secondary | ICD-10-CM

## 2014-01-21 DIAGNOSIS — F411 Generalized anxiety disorder: Secondary | ICD-10-CM

## 2014-01-21 DIAGNOSIS — Z0189 Encounter for other specified special examinations: Secondary | ICD-10-CM

## 2014-01-21 DIAGNOSIS — Z0000 Encounter for general adult medical examination without abnormal findings: Secondary | ICD-10-CM

## 2014-01-21 MED ORDER — SERTRALINE HCL 100 MG PO TABS: ORAL_TABLET | ORAL | Status: DC

## 2014-01-21 MED ORDER — ALPRAZOLAM 1 MG PO TABS: ORAL_TABLET | ORAL | Status: DC

## 2014-01-21 NOTE — Progress Notes (Signed)
Pre visit review using our clinic review tool, if applicable. No additional management support is needed unless otherwise documented below in the visit note. 

## 2014-01-21 NOTE — Patient Instructions (Signed)
You had the flu shot today  OK to increase the zoloft to 2 tabs per day  Please continue all other medications as before, and refills have been done if requested - the xanax  Please have the pharmacy call with any other refills you may need.  Please continue your efforts at being more active, low cholesterol diet, and weight control.  Please keep your appointments with your specialists as you may have planned  Please go to the LAB in the Basement (turn left off the elevator) for the tests to be done today  You will be contacted by phone if any changes need to be made immediately.  Otherwise, you will receive a letter about your results with an explanation, but please check with MyChart first.  Please return in 6 months, or sooner if needed, with Lab testing done 3-5 days before

## 2014-01-21 NOTE — Progress Notes (Signed)
Subjective:    Patient ID: Crystal Thornton, female    DOB: [birth year], 68 y.o.   MRN: [id number]  HPI  Here to f/u; overall doing ok,  Pt denies chest pain, increased sob or doe, wheezing, orthopnea, PND, increased LE swelling, palpitations, dizziness or syncope.  Pt denies polydipsia, polyuria, or low sugar symptoms such as weakness or confusion improved with po intake.  Pt denies new neurological symptoms such as new headache, or facial or extremity weakness or numbness.   Pt states overall good compliance with meds, has been trying to follow lower cholesterol, diabetic diet, with wt overall stable,  but little exercise however. Has been successful with further wt control. No new complaints except stress incresaed due to family son and granddaughter living with her.  Denies worsening depressive symptoms, suicidal ideation, or panic Past Medical History  Diagnosis Date  . Depression   . Diabetes mellitus   . Hyperlipidemia   . Hypertension   . Osteopenia   . Anxiety   . Pain     R. shoulder and lower back and legs  . Hepatitis C     Treated and no longer has  . ANGIOEDEMA 01/24/2010  . ANXIETY 11/07/2006  . BACK PAIN 11/01/2007  . Cough 05/29/2008  . CYST, OVARIAN NEC/NOS 11/05/2006  . DEPRESSION 11/07/2006  . DIABETES MELLITUS, TYPE II 11/07/2006  . DIVERTICULOSIS, COLON 11/07/2006  . FIBROCYSTIC BREAST DISEASE 11/05/2006  . HEAD TRAUMA, CLOSED 01/24/2010  . HEPATITIS B, HX OF 11/05/2006  . HEPATITIS C 11/05/2006  . HYPERLIPIDEMIA 11/07/2006  . HYPERTENSION 05/29/2007  . INSOMNIA, HX OF 11/05/2006  . LOW BACK PAIN 05/29/2007  . OSTEOPENIA 11/07/2006  . Other specified sites of sprains and strains 08/31/2008  . PULMONARY NODULE, LEFT UPPER LOBE 05/31/2009  . SHOULDER PAIN, RIGHT, CHRONIC 06/02/2010  . SINUSITIS- ACUTE-NOS 12/10/2007  . SKIN LESION 06/02/2010  . TOTAL ABDOMINAL HYSTERECTOMY, HX OF 03/27/1990  . Wears glasses   . Wears dentures     full top   Past Surgical History  Procedure  Laterality Date  . Tubal ligation    . Cesarean section    . Shoulder arthroscopy  2008    left  . Ovarian cyst surgery      ruptured  . Appendectomy    . Colonoscopy    . Liver biopsy    . Cervical fusion  2013  . Orif ankle fracture Left 12/03/2012  . External fixation leg Left 12/12/2012    Procedure: LEFT ADJUSTMENT OF EXTERNAL FIXATION UNDER ANESTHESIA;  Surgeon: Wylene Simmer, MD;  Location: [facility];  Service: Orthopedics;  Laterality: Left;  . Orif ankle fracture Left 01/02/2013    Procedure: LEFT ANKLE REMOVAL OF EXTERNAL FIXATOR OPEN REDUCTION INTERNAL FIXATION (ORIF) TRIMALLEOLAR FRACTURE;  Surgeon: Wylene Simmer, MD;  Location: [facility];  Service: Orthopedics;  Laterality: Left;    reports that she has been smoking Cigarettes.  She has been smoking about 0.25 packs per day. She has never used smokeless tobacco. She reports that she does not drink alcohol or use illicit drugs. family history includes Liver cancer in her mother. Allergies  Allergen Reactions  . Naproxen Sodium Anaphylaxis  . Sulfonamide Derivatives Other (See Comments)    Causes nephritis  . Ace Inhibitors Swelling    Lip swelling   Current Outpatient Prescriptions on File Prior to Visit  Medication Sig Dispense Refill  . aspirin EC 325 MG tablet Take 325 mg by mouth daily.      Marland Kitchen  atenolol (TENORMIN) 50 MG tablet Take 1 tablet (50 mg total) by mouth daily.  90 tablet  3  . B Complex-C (B-COMPLEX WITH VITAMIN C) tablet Take 1 tablet by mouth daily.      Marland Kitchen GINKGO BILOBA COMPLEX PO Take 1 tablet by mouth daily.      . Ginseng 500 MG CAPS Take 1 capsule by mouth daily.      . metFORMIN (GLUCOPHAGE) 500 MG tablet Take 2 tablets (1,000 mg total) by mouth daily with breakfast.  180 tablet  3  . Multiple Vitamins-Minerals (CENTRUM SILVER PO) Take 1 tablet by mouth daily.        Marland Kitchen oxycodone (ROXICODONE) 30 MG immediate release tablet Take 30 mg by mouth 5 (five) times daily as needed for  pain.       . pravastatin (PRAVACHOL) 40 MG tablet Take 2 tablets (80 mg total) by mouth daily.  90 tablet  3  . tiZANidine (ZANAFLEX) 2 MG tablet Take 1 tablet (2 mg total) by mouth 2 (two) times daily as needed (for muscle relaxation / sleep).  60 tablet  1   No current facility-administered medications on file prior to visit.   Review of Systems  Constitutional: Negative for unusual diaphoresis or other sweats  HENT: Negative for ringing in ear Eyes: Negative for double vision or worsening visual disturbance.  Respiratory: Negative for choking and stridor.   Gastrointestinal: Negative for vomiting or other signifcant bowel change Genitourinary: Negative for hematuria or decreased urine volume.  Musculoskeletal: Negative for other MSK pain or swelling Skin: Negative for color change and worsening wound.  Neurological: Negative for tremors and numbness other than noted  Psychiatric/Behavioral: Negative for decreased concentration or agitation other than above       Objective:   Physical Exam BP 132/80  Pulse 65  Temp(Src) 98.3 F (36.8 C) (Oral)  Ht 5' 7.5" (1.715 m)  Wt 142 lb 8 oz (64.638 kg)  BMI 21.98 kg/m2  SpO2 96% VS noted, not ill apearing Constitutional: Pt appears well-developed, well-nourished.  HENT: Head: NCAT.  Right Ear: External ear normal.  Left Ear: External ear normal.  Eyes: . Pupils are equal, round, and reactive to light. Conjunctivae and EOM are normal Neck: Normal range of motion. Neck supple.  Cardiovascular: Normal rate and regular rhythm.   Pulmonary/Chest: Effort normal and breath sounds normal.  - no rales or wheezing Neurological: Pt is alert. Not confused , motor grossly intact Skin: Skin is warm. No rash Psychiatric: Pt behavior is normal. No agitation. mild nervous    Assessment & Plan:

## 2014-01-22 ENCOUNTER — Ambulatory Visit: Payer: Commercial Managed Care - HMO | Admitting: Internal Medicine

## 2014-01-22 ENCOUNTER — Telehealth: Payer: Self-pay | Admitting: Internal Medicine

## 2014-01-22 NOTE — Telephone Encounter (Signed)
emmi emailed °

## 2014-01-24 NOTE — Assessment & Plan Note (Signed)
stable overall by history and exam, recent data reviewed with pt, and pt to continue medical treatment as before,  to f/u any worsening symptoms or concerns Lab Results  Component Value Date   HGBA1C 6.6* 09/17/2013

## 2014-01-24 NOTE — Assessment & Plan Note (Signed)
stable overall by history and exam, recent data reviewed with pt, and pt to continue medical treatment as before,  to f/u any worsening symptoms or concerns BP Readings from Last 3 Encounters:  01/21/14 132/80  07/23/13 122/80  01/03/13 157/85

## 2014-01-24 NOTE — Assessment & Plan Note (Addendum)
With some situational recent worsening acute on chronic, for increased zoloft to 200 qd, declines counseling at this time

## 2014-01-24 NOTE — Assessment & Plan Note (Signed)
stable overall by history and exam, recent data reviewed with pt, and pt to continue medical treatment as before - declines statin for now, for cont'd low chol diet, to f/u any worsening symptoms or concerns Lab Results  Component Value Date   LDLCALC 134* 09/17/2013

## 2014-04-02 ENCOUNTER — Other Ambulatory Visit: Payer: Self-pay

## 2014-04-02 DIAGNOSIS — M1990 Unspecified osteoarthritis, unspecified site: Secondary | ICD-10-CM | POA: Diagnosis not present

## 2014-04-02 DIAGNOSIS — M546 Pain in thoracic spine: Secondary | ICD-10-CM | POA: Diagnosis not present

## 2014-04-02 DIAGNOSIS — M25519 Pain in unspecified shoulder: Secondary | ICD-10-CM | POA: Diagnosis not present

## 2014-04-02 MED ORDER — PRAVASTATIN SODIUM 40 MG PO TABS: 80.0000 mg | ORAL_TABLET | Freq: Every day | ORAL | Status: DC

## 2014-04-07 ENCOUNTER — Ambulatory Visit: Payer: Commercial Managed Care - HMO | Admitting: Internal Medicine

## 2014-04-30 DIAGNOSIS — M25519 Pain in unspecified shoulder: Secondary | ICD-10-CM | POA: Diagnosis not present

## 2014-04-30 DIAGNOSIS — M546 Pain in thoracic spine: Secondary | ICD-10-CM | POA: Diagnosis not present

## 2014-04-30 DIAGNOSIS — M5412 Radiculopathy, cervical region: Secondary | ICD-10-CM | POA: Diagnosis not present

## 2014-05-28 DIAGNOSIS — M461 Sacroiliitis, not elsewhere classified: Secondary | ICD-10-CM | POA: Diagnosis not present

## 2014-05-28 DIAGNOSIS — M546 Pain in thoracic spine: Secondary | ICD-10-CM | POA: Diagnosis not present

## 2014-05-28 DIAGNOSIS — M791 Myalgia: Secondary | ICD-10-CM | POA: Diagnosis not present

## 2014-05-28 DIAGNOSIS — M792 Neuralgia and neuritis, unspecified: Secondary | ICD-10-CM | POA: Diagnosis not present

## 2014-07-02 DIAGNOSIS — M461 Sacroiliitis, not elsewhere classified: Secondary | ICD-10-CM | POA: Diagnosis not present

## 2014-07-02 DIAGNOSIS — M792 Neuralgia and neuritis, unspecified: Secondary | ICD-10-CM | POA: Diagnosis not present

## 2014-07-02 DIAGNOSIS — M791 Myalgia: Secondary | ICD-10-CM | POA: Diagnosis not present

## 2014-07-02 DIAGNOSIS — M546 Pain in thoracic spine: Secondary | ICD-10-CM | POA: Diagnosis not present

## 2014-07-10 ENCOUNTER — Other Ambulatory Visit: Payer: Self-pay | Admitting: Internal Medicine

## 2014-07-10 NOTE — Telephone Encounter (Signed)
Faxed script back to [facility].../lmb 

## 2014-07-10 NOTE — Telephone Encounter (Signed)
Done hardcopy to [name]  

## 2014-07-23 ENCOUNTER — Ambulatory Visit: Payer: Commercial Managed Care - HMO | Admitting: Internal Medicine

## 2014-07-30 DIAGNOSIS — M461 Sacroiliitis, not elsewhere classified: Secondary | ICD-10-CM | POA: Diagnosis not present

## 2014-07-30 DIAGNOSIS — M546 Pain in thoracic spine: Secondary | ICD-10-CM | POA: Diagnosis not present

## 2014-07-30 DIAGNOSIS — M792 Neuralgia and neuritis, unspecified: Secondary | ICD-10-CM | POA: Diagnosis not present

## 2014-07-30 DIAGNOSIS — M791 Myalgia: Secondary | ICD-10-CM | POA: Diagnosis not present

## 2014-08-06 ENCOUNTER — Ambulatory Visit: Payer: Commercial Managed Care - HMO | Admitting: Internal Medicine

## 2014-08-06 DIAGNOSIS — Z0289 Encounter for other administrative examinations: Secondary | ICD-10-CM

## 2014-08-12 ENCOUNTER — Other Ambulatory Visit (INDEPENDENT_AMBULATORY_CARE_PROVIDER_SITE_OTHER): Payer: Commercial Managed Care - HMO

## 2014-08-12 ENCOUNTER — Other Ambulatory Visit: Payer: Self-pay

## 2014-08-12 ENCOUNTER — Ambulatory Visit (INDEPENDENT_AMBULATORY_CARE_PROVIDER_SITE_OTHER): Payer: Commercial Managed Care - HMO | Admitting: Internal Medicine

## 2014-08-12 ENCOUNTER — Encounter: Payer: Self-pay | Admitting: Internal Medicine

## 2014-08-12 VITALS — BP 142/92 | HR 110 | Temp 98.1°F | Wt 147.1 lb

## 2014-08-12 DIAGNOSIS — E119 Type 2 diabetes mellitus without complications: Secondary | ICD-10-CM

## 2014-08-12 DIAGNOSIS — Z202 Contact with and (suspected) exposure to infections with a predominantly sexual mode of transmission: Secondary | ICD-10-CM

## 2014-08-12 DIAGNOSIS — Z0000 Encounter for general adult medical examination without abnormal findings: Secondary | ICD-10-CM | POA: Diagnosis not present

## 2014-08-12 DIAGNOSIS — Z720 Tobacco use: Secondary | ICD-10-CM

## 2014-08-12 DIAGNOSIS — F17200 Nicotine dependence, unspecified, uncomplicated: Secondary | ICD-10-CM | POA: Insufficient documentation

## 2014-08-12 LAB — BASIC METABOLIC PANEL
BUN: 19 mg/dL (ref 6–23)
CO2: 31 mEq/L (ref 19–32)
Calcium: 10.5 mg/dL (ref 8.4–10.5)
Chloride: 102 mEq/L (ref 96–112)
Creatinine, Ser: 0.79 mg/dL (ref 0.40–1.20)
GFR: 76.88 mL/min (ref >60.00)
Glucose, Bld: 115 mg/dL — ABNORMAL HIGH (ref 70–99)
Potassium: 4.8 mEq/L (ref 3.5–5.1)
Sodium: 138 mEq/L (ref 135–145)

## 2014-08-12 LAB — URINALYSIS, ROUTINE W REFLEX MICROSCOPIC
BILIRUBIN URINE: NEGATIVE
Hgb urine dipstick: NEGATIVE
KETONES UR: NEGATIVE
Leukocytes, UA: NEGATIVE
Nitrite: NEGATIVE
PH: 6.5 (ref 5.0–8.0)
Specific Gravity, Urine: 1.02 (ref 1.000–1.030)
Total Protein, Urine: NEGATIVE
URINE GLUCOSE: NEGATIVE
Urobilinogen, UA: 0.2 (ref 0.0–1.0)

## 2014-08-12 LAB — CBC WITH DIFFERENTIAL/PLATELET
BASOS PCT: 0.7 % (ref 0.0–3.0)
Basophils Absolute: 0 10*3/uL (ref 0.0–0.1)
EOS ABS: 0.1 10*3/uL (ref 0.0–0.7)
Eosinophils Relative: 1.3 % (ref 0.0–5.0)
HCT: 44 % (ref 36.0–46.0)
HEMOGLOBIN: 15 g/dL (ref 12.0–15.0)
Lymphocytes Relative: 24.2 % (ref 12.0–46.0)
Lymphs Abs: 1.8 10*3/uL (ref 0.7–4.0)
MCHC: 34 g/dL (ref 30.0–36.0)
MCV: 90.9 fl (ref 78.0–100.0)
MONO ABS: 0.7 10*3/uL (ref 0.1–1.0)
Monocytes Relative: 9 % (ref 3.0–12.0)
NEUTROS PCT: 64.8 % (ref 43.0–77.0)
Neutro Abs: 4.8 10*3/uL (ref 1.4–7.7)
Platelets: 278 10*3/uL (ref 150.0–400.0)
RBC: 4.84 Mil/uL (ref 3.87–5.11)
RDW: 13.9 % (ref 11.5–15.5)
WBC: 7.5 10*3/uL (ref 4.0–10.5)

## 2014-08-12 LAB — HEPATIC FUNCTION PANEL
ALT: 13 U/L (ref 0–35)
AST: 16 U/L (ref 0–37)
Albumin: 4.2 g/dL (ref 3.5–5.2)
Alkaline Phosphatase: 107 U/L (ref 39–117)
BILIRUBIN DIRECT: 0.1 mg/dL (ref 0.0–0.3)
Total Bilirubin: 0.4 mg/dL (ref 0.2–1.2)
Total Protein: 7.6 g/dL (ref 6.0–8.3)

## 2014-08-12 LAB — LIPID PANEL
CHOL/HDL RATIO: 3
CHOLESTEROL: 178 mg/dL (ref 0–200)
HDL: 56.4 mg/dL (ref >39.00)
LDL CALC: 101 mg/dL — AB (ref 0–99)
NonHDL: 121.6
Triglycerides: 102 mg/dL (ref 0.0–149.0)
VLDL: 20.4 mg/dL (ref 0.0–40.0)

## 2014-08-12 LAB — HEMOGLOBIN A1C: Hgb A1c MFr Bld: 6.2 % (ref 4.6–6.5)

## 2014-08-12 LAB — MICROALBUMIN / CREATININE URINE RATIO
CREATININE, U: 102.9 mg/dL
Microalb Creat Ratio: 2.8 mg/g (ref 0.0–30.0)
Microalb, Ur: 2.9 mg/dL — ABNORMAL HIGH (ref 0.0–1.9)

## 2014-08-12 MED ORDER — PRAVASTATIN SODIUM 40 MG PO TABS: 80.0000 mg | ORAL_TABLET | Freq: Every day | ORAL | Status: DC

## 2014-08-12 MED ORDER — METFORMIN HCL 500 MG PO TABS: 1000.0000 mg | ORAL_TABLET | Freq: Every day | ORAL | Status: DC

## 2014-08-12 MED ORDER — VARENICLINE TARTRATE 0.5 MG X 11 & 1 MG X 42 PO MISC: ORAL | Status: DC

## 2014-08-12 MED ORDER — ATENOLOL 50 MG PO TABS: 50.0000 mg | ORAL_TABLET | Freq: Every day | ORAL | Status: DC

## 2014-08-12 MED ORDER — SERTRALINE HCL 100 MG PO TABS: ORAL_TABLET | ORAL | Status: DC

## 2014-08-12 MED ORDER — AMOXICILLIN-POT CLAVULANATE 875-125 MG PO TABS: 1.0000 | ORAL_TABLET | Freq: Two times a day (BID) | ORAL | Status: DC

## 2014-08-12 MED ORDER — VARENICLINE TARTRATE 1 MG PO TABS: 1.0000 mg | ORAL_TABLET | Freq: Two times a day (BID) | ORAL | Status: DC

## 2014-08-12 NOTE — Assessment & Plan Note (Signed)

## 2014-08-12 NOTE — Progress Notes (Signed)
Subjective:    Patient ID: Crystal Thornton, female    DOB: July 11, 1945, 69 y.o.   MRN: [id number]  HPI  Here for wellness and f/u;  Overall doing ok;  Pt denies Chest pain, worsening SOB, DOE, wheezing, orthopnea, PND, worsening LE edema, palpitations, dizziness or syncope.  Pt denies neurological change such as new headache, facial or extremity weakness.  Pt denies polydipsia, polyuria, or low sugar symptoms. Pt states overall good compliance with treatment and medications, good tolerability, and has been trying to follow appropriate diet.  Pt denies worsening depressive symptoms, suicidal ideation or panic. No fever, night sweats, wt loss, loss of appetite, or other constitutional symptoms.  Pt states good ability with ADL's, has low fall risk, home safety reviewed and adequate, no other significant changes in hearing or vision, and only occasionally active with exercise.  Has had new 4 mo puppy mult bites to distal extremities. No fever but pain increased last 1-2 days. Asks for chantix to quit smoking.  Asks for HIV testing, declines other testing at this time.   Past Medical History  Diagnosis Date  . Depression   . Diabetes mellitus   . Hyperlipidemia   . Hypertension   . Osteopenia   . Anxiety   . Pain     R. shoulder and lower back and legs  . Hepatitis C     Treated and no longer has  . ANGIOEDEMA 01/24/2010  . ANXIETY 11/07/2006  . BACK PAIN 11/01/2007  . Cough 05/29/2008  . CYST, OVARIAN NEC/NOS 11/05/2006  . DEPRESSION 11/07/2006  . DIABETES MELLITUS, TYPE II 11/07/2006  . DIVERTICULOSIS, COLON 11/07/2006  . FIBROCYSTIC BREAST DISEASE 11/05/2006  . HEAD TRAUMA, CLOSED 01/24/2010  . HEPATITIS B, HX OF 11/05/2006  . HEPATITIS C 11/05/2006  . HYPERLIPIDEMIA 11/07/2006  . HYPERTENSION 05/29/2007  . INSOMNIA, HX OF 11/05/2006  . LOW BACK PAIN 05/29/2007  . OSTEOPENIA 11/07/2006  . Other specified sites of sprains and strains 08/31/2008  . PULMONARY NODULE, LEFT UPPER LOBE 05/31/2009  . SHOULDER  PAIN, RIGHT, CHRONIC 06/02/2010  . SINUSITIS- ACUTE-NOS 12/10/2007  . SKIN LESION 06/02/2010  . TOTAL ABDOMINAL HYSTERECTOMY, HX OF 03/27/1990  . Wears glasses   . Wears dentures     full top   Past Surgical History  Procedure Laterality Date  . Tubal ligation    . Cesarean section    . Shoulder arthroscopy  2008    left  . Ovarian cyst surgery      ruptured  . Appendectomy    . Colonoscopy    . Liver biopsy    . Cervical fusion  2013  . Orif ankle fracture Left 12/03/2012  . External fixation leg Left 12/12/2012    Procedure: LEFT ADJUSTMENT OF EXTERNAL FIXATION UNDER ANESTHESIA;  Surgeon: Wylene Simmer, MD;  Location: [facility];  Service: Orthopedics;  Laterality: Left;  . Orif ankle fracture Left 01/02/2013    Procedure: LEFT ANKLE REMOVAL OF EXTERNAL FIXATOR OPEN REDUCTION INTERNAL FIXATION (ORIF) TRIMALLEOLAR FRACTURE;  Surgeon: Wylene Simmer, MD;  Location: [facility];  Service: Orthopedics;  Laterality: Left;    reports that she has been smoking Cigarettes.  She has been smoking about 0.25 packs per day. She has never used smokeless tobacco. She reports that she does not drink alcohol or use illicit drugs. family history includes Liver cancer in her mother. Allergies  Allergen Reactions  . Naproxen Sodium Anaphylaxis  . Sulfonamide Derivatives Other (See Comments)  Causes nephritis  . Ace Inhibitors Swelling    Lip swelling   Current Outpatient Prescriptions on File Prior to Visit  Medication Sig Dispense Refill  . ALPRAZolam (XANAX) 1 MG tablet TAKE 1 TABLET BY MOUTH THREE TIMES DAILYAS NEEDED 90 tablet 5  . aspirin EC 325 MG tablet Take 325 mg by mouth daily.    Marland Kitchen atenolol (TENORMIN) 50 MG tablet Take 1 tablet (50 mg total) by mouth daily. 90 tablet 3  . B Complex-C (B-COMPLEX WITH VITAMIN C) tablet Take 1 tablet by mouth daily.    Marland Kitchen GINKGO BILOBA COMPLEX PO Take 1 tablet by mouth daily.    . Ginseng 500 MG CAPS Take 1 capsule by mouth daily.      . metFORMIN (GLUCOPHAGE) 500 MG tablet Take 2 tablets (1,000 mg total) by mouth daily with breakfast. 180 tablet 3  . Multiple Vitamins-Minerals (CENTRUM SILVER PO) Take 1 tablet by mouth daily.      Marland Kitchen oxycodone (ROXICODONE) 30 MG immediate release tablet Take 30 mg by mouth 5 (five) times daily as needed for pain.     . pravastatin (PRAVACHOL) 40 MG tablet Take 2 tablets (80 mg total) by mouth daily. 90 tablet 3  . sertraline (ZOLOFT) 100 MG tablet 2 tabs by mouth per day 180 tablet 3  . tiZANidine (ZANAFLEX) 2 MG tablet Take 1 tablet (2 mg total) by mouth 2 (two) times daily as needed (for muscle relaxation / sleep). 60 tablet 1   No current facility-administered medications on file prior to visit.   Review of Systems Constitutional: Negative for increased diaphoresis, other activity, appetite or siginficant weight change other than noted HENT: Negative for worsening hearing loss, ear pain, facial swelling, mouth sores and neck stiffness.   Eyes: Negative for other worsening pain, redness or visual disturbance.  Respiratory: Negative for shortness of breath and wheezing  Cardiovascular: Negative for chest pain and palpitations.  Gastrointestinal: Negative for diarrhea, blood in stool, abdominal distention or other pain Genitourinary: Negative for hematuria, flank pain or change in urine volume.  Musculoskeletal: Negative for myalgias or other joint complaints.  Skin: Negative for color change and wound or drainage.  Neurological: Negative for syncope and numbness. other than noted Hematological: Negative for adenopathy. or other swelling Psychiatric/Behavioral: Negative for hallucinations, SI, self-injury, decreased concentration or other worsening agitation.      Objective:   Physical Exam BP 142/92 mmHg  Pulse 110  Temp(Src) 98.1 F (36.7 C) (Oral)  Wt 147 lb 1.9 oz (66.733 kg)  SpO2 96% VS noted,  Constitutional: Pt is oriented to person, place, and time. Appears  well-developed and well-nourished, in no significant distress Head: Normocephalic and atraumatic.  Right Ear: External ear normal.  Left Ear: External ear normal.  Nose: Nose normal.  Mouth/Throat: Oropharynx is clear and moist.  Eyes: Conjunctivae and EOM are normal. Pupils are equal, round, and reactive to light.  Neck: Normal range of motion. Neck supple. No JVD present. No tracheal deviation present or significant neck LA or mass Cardiovascular: Normal rate, regular rhythm, normal heart sounds and intact distal pulses.   Pulmonary/Chest: Effort normal and breath sounds without rales or wheezing  Abdominal: Soft. Bowel sounds are normal. NT. No HSM  Musculoskeletal: Normal range of motion. Exhibits no edema.  Lymphadenopathy:  Has no cervical adenopathy.  Neurological: Pt is alert and oriented to person, place, and time. Pt has normal reflexes. No cranial nerve deficit. Motor grossly intact Skin: Skin is warm and dry. No rash  noted. has numerous punture wounds to both arms with tender, erythema several areas, no drainage Psychiatric:  Has normal mood and affect. Behavior is normal.      Assessment & Plan:

## 2014-08-12 NOTE — Progress Notes (Signed)
Pre visit review using our clinic review tool, if applicable. No additional management support is needed unless otherwise documented below in the visit note. 

## 2014-08-12 NOTE — Patient Instructions (Signed)
Please take all new medication as prescribed - the augmentin antibiotic, and the chantix  Please continue all other medications as before, and refills have been done if requested.  Please have the pharmacy call with any other refills you may need.  Please continue your efforts at being more active, low cholesterol diet, and weight control.  You are otherwise up to date with prevention measures today.  Please keep your appointments with your specialists as you may have planned  Please go to the LAB in the Basement (turn left off the elevator) for the tests to be done today  You will be contacted by phone if any changes need to be made immediately.  Otherwise, you will receive a letter about your results with an explanation, but please check with MyChart first.  Please return in 6 months, or sooner if needed, with Lab testing done 3-5 days before

## 2014-08-12 NOTE — Assessment & Plan Note (Signed)
stable overall by history and exam, recent data reviewed with pt, and pt to continue medical treatment as before,  to f/u any worsening symptoms or concerns Lab Results  Component Value Date   HGBA1C 6.6* 09/17/2013    for f/u a1c today

## 2014-08-13 LAB — HIV ANTIBODY (ROUTINE TESTING W REFLEX): HIV 1&2 Ab, 4th Generation: NONREACTIVE

## 2014-08-13 LAB — TSH: TSH: 0.45 u[IU]/mL (ref 0.35–4.50)

## 2014-08-27 DIAGNOSIS — M791 Myalgia: Secondary | ICD-10-CM | POA: Diagnosis not present

## 2014-08-27 DIAGNOSIS — M461 Sacroiliitis, not elsewhere classified: Secondary | ICD-10-CM | POA: Diagnosis not present

## 2014-08-27 DIAGNOSIS — M546 Pain in thoracic spine: Secondary | ICD-10-CM | POA: Diagnosis not present

## 2014-08-27 DIAGNOSIS — M792 Neuralgia and neuritis, unspecified: Secondary | ICD-10-CM | POA: Diagnosis not present

## 2014-09-16 ENCOUNTER — Telehealth: Payer: Self-pay | Admitting: Internal Medicine

## 2014-09-16 NOTE — Telephone Encounter (Signed)
Patient is needing a [employer] referral to her pain doctor Dr. Mohammed Kindle

## 2014-10-01 DIAGNOSIS — M546 Pain in thoracic spine: Secondary | ICD-10-CM | POA: Diagnosis not present

## 2014-10-01 DIAGNOSIS — Z79891 Long term (current) use of opiate analgesic: Secondary | ICD-10-CM | POA: Diagnosis not present

## 2014-10-01 DIAGNOSIS — Z5181 Encounter for therapeutic drug level monitoring: Secondary | ICD-10-CM | POA: Diagnosis not present

## 2014-10-01 DIAGNOSIS — M461 Sacroiliitis, not elsewhere classified: Secondary | ICD-10-CM | POA: Diagnosis not present

## 2014-10-01 DIAGNOSIS — M533 Sacrococcygeal disorders, not elsewhere classified: Secondary | ICD-10-CM | POA: Diagnosis not present

## 2014-10-01 DIAGNOSIS — M792 Neuralgia and neuritis, unspecified: Secondary | ICD-10-CM | POA: Diagnosis not present

## 2014-10-01 DIAGNOSIS — M791 Myalgia: Secondary | ICD-10-CM | POA: Diagnosis not present

## 2014-10-12 NOTE — Telephone Encounter (Signed)
[facility] and informed pharmacist that this patient is not seen at Dr Ethel Rana office for the PA of Flector patch.

## 2014-11-01 ENCOUNTER — Inpatient Hospital Stay (HOSPITAL_COMMUNITY): Payer: Commercial Managed Care - HMO

## 2014-11-01 ENCOUNTER — Encounter (HOSPITAL_COMMUNITY): Payer: Self-pay

## 2014-11-01 ENCOUNTER — Inpatient Hospital Stay (HOSPITAL_COMMUNITY)
Admission: EM | Admit: 2014-11-01 | Discharge: 2014-11-04 | DRG: 092 | Disposition: A | Discharge status: Skilled Nursing Facility | Payer: Commercial Managed Care - HMO | Attending: Internal Medicine | Admitting: Internal Medicine

## 2014-11-01 ENCOUNTER — Emergency Department (HOSPITAL_COMMUNITY): Payer: Commercial Managed Care - HMO

## 2014-11-01 DIAGNOSIS — T424X5A Adverse effect of benzodiazepines, initial encounter: Secondary | ICD-10-CM | POA: Diagnosis present

## 2014-11-01 DIAGNOSIS — F13239 Sedative, hypnotic or anxiolytic dependence with withdrawal, unspecified: Secondary | ICD-10-CM | POA: Diagnosis present

## 2014-11-01 DIAGNOSIS — I10 Essential (primary) hypertension: Secondary | ICD-10-CM | POA: Diagnosis not present

## 2014-11-01 DIAGNOSIS — M8580 Other specified disorders of bone density and structure, unspecified site: Secondary | ICD-10-CM | POA: Diagnosis present

## 2014-11-01 DIAGNOSIS — Z79899 Other long term (current) drug therapy: Secondary | ICD-10-CM | POA: Diagnosis not present

## 2014-11-01 DIAGNOSIS — R569 Unspecified convulsions: Secondary | ICD-10-CM

## 2014-11-01 DIAGNOSIS — G894 Chronic pain syndrome: Secondary | ICD-10-CM | POA: Insufficient documentation

## 2014-11-01 DIAGNOSIS — Z882 Allergy status to sulfonamides status: Secondary | ICD-10-CM | POA: Diagnosis not present

## 2014-11-01 DIAGNOSIS — F22 Delusional disorders: Secondary | ICD-10-CM | POA: Clinically undetermined

## 2014-11-01 DIAGNOSIS — I9589 Other hypotension: Secondary | ICD-10-CM

## 2014-11-01 DIAGNOSIS — G40909 Epilepsy, unspecified, not intractable, without status epilepticus: Secondary | ICD-10-CM | POA: Diagnosis not present

## 2014-11-01 DIAGNOSIS — Z79891 Long term (current) use of opiate analgesic: Secondary | ICD-10-CM

## 2014-11-01 DIAGNOSIS — G92 Toxic encephalopathy: Principal | ICD-10-CM | POA: Diagnosis present

## 2014-11-01 DIAGNOSIS — Z883 Allergy status to other anti-infective agents status: Secondary | ICD-10-CM | POA: Diagnosis not present

## 2014-11-01 DIAGNOSIS — F17210 Nicotine dependence, cigarettes, uncomplicated: Secondary | ICD-10-CM | POA: Diagnosis not present

## 2014-11-01 DIAGNOSIS — M898X8 Other specified disorders of bone, other site: Secondary | ICD-10-CM | POA: Diagnosis not present

## 2014-11-01 DIAGNOSIS — G9340 Encephalopathy, unspecified: Secondary | ICD-10-CM | POA: Diagnosis not present

## 2014-11-01 DIAGNOSIS — Z794 Long term (current) use of insulin: Secondary | ICD-10-CM | POA: Diagnosis not present

## 2014-11-01 DIAGNOSIS — E089 Diabetes mellitus due to underlying condition without complications: Secondary | ICD-10-CM | POA: Diagnosis not present

## 2014-11-01 DIAGNOSIS — Z7982 Long term (current) use of aspirin: Secondary | ICD-10-CM

## 2014-11-01 DIAGNOSIS — I959 Hypotension, unspecified: Secondary | ICD-10-CM | POA: Diagnosis present

## 2014-11-01 DIAGNOSIS — F419 Anxiety disorder, unspecified: Secondary | ICD-10-CM | POA: Diagnosis present

## 2014-11-01 DIAGNOSIS — R4182 Altered mental status, unspecified: Secondary | ICD-10-CM | POA: Diagnosis present

## 2014-11-01 DIAGNOSIS — N179 Acute kidney failure, unspecified: Secondary | ICD-10-CM

## 2014-11-01 DIAGNOSIS — Y929 Unspecified place or not applicable: Secondary | ICD-10-CM

## 2014-11-01 DIAGNOSIS — F329 Major depressive disorder, single episode, unspecified: Secondary | ICD-10-CM | POA: Diagnosis not present

## 2014-11-01 DIAGNOSIS — I6782 Cerebral ischemia: Secondary | ICD-10-CM | POA: Diagnosis not present

## 2014-11-01 DIAGNOSIS — G40409 Other generalized epilepsy and epileptic syndromes, not intractable, without status epilepticus: Secondary | ICD-10-CM | POA: Diagnosis not present

## 2014-11-01 DIAGNOSIS — R930 Abnormal findings on diagnostic imaging of skull and head, not elsewhere classified: Secondary | ICD-10-CM | POA: Diagnosis not present

## 2014-11-01 DIAGNOSIS — E119 Type 2 diabetes mellitus without complications: Secondary | ICD-10-CM | POA: Diagnosis not present

## 2014-11-01 DIAGNOSIS — R443 Hallucinations, unspecified: Secondary | ICD-10-CM | POA: Clinically undetermined

## 2014-11-01 DIAGNOSIS — F32A Depression, unspecified: Secondary | ICD-10-CM | POA: Diagnosis present

## 2014-11-01 DIAGNOSIS — E785 Hyperlipidemia, unspecified: Secondary | ICD-10-CM | POA: Diagnosis present

## 2014-11-01 DIAGNOSIS — IMO0002 Reserved for concepts with insufficient information to code with codable children: Secondary | ICD-10-CM

## 2014-11-01 DIAGNOSIS — E1165 Type 2 diabetes mellitus with hyperglycemia: Secondary | ICD-10-CM

## 2014-11-01 HISTORY — DX: Unspecified convulsions: R56.9

## 2014-11-01 LAB — CBC WITH DIFFERENTIAL/PLATELET
Basophils Absolute: 0 10*3/uL (ref 0.0–0.1)
Basophils Relative: 0 % (ref 0–1)
EOS ABS: 0.1 10*3/uL (ref 0.0–0.7)
Eosinophils Relative: 2 % (ref 0–5)
HCT: 39.6 % (ref 36.0–46.0)
Hemoglobin: 13.3 g/dL (ref 12.0–15.0)
LYMPHS ABS: 1.3 10*3/uL (ref 0.7–4.0)
LYMPHS PCT: 16 % (ref 12–46)
MCH: 31.6 pg (ref 26.0–34.0)
MCHC: 33.6 g/dL (ref 30.0–36.0)
MCV: 94.1 fL (ref 78.0–100.0)
Monocytes Absolute: 0.8 10*3/uL (ref 0.1–1.0)
Monocytes Relative: 11 % (ref 3–12)
Neutro Abs: 5.4 10*3/uL (ref 1.7–7.7)
Neutrophils Relative %: 71 % (ref 43–77)
PLATELETS: 220 10*3/uL (ref 150–400)
RBC: 4.21 MIL/uL (ref 3.87–5.11)
RDW: 13.3 % (ref 11.5–15.5)
WBC: 7.6 10*3/uL (ref 4.0–10.5)

## 2014-11-01 LAB — URINALYSIS, ROUTINE W REFLEX MICROSCOPIC
Bilirubin Urine: NEGATIVE
Bilirubin Urine: NEGATIVE
GLUCOSE, UA: NEGATIVE mg/dL
Glucose, UA: NEGATIVE mg/dL
HGB URINE DIPSTICK: NEGATIVE
Hgb urine dipstick: NEGATIVE
Ketones, ur: NEGATIVE mg/dL
Ketones, ur: NEGATIVE mg/dL
LEUKOCYTES UA: NEGATIVE
Leukocytes, UA: NEGATIVE
NITRITE: NEGATIVE
Nitrite: NEGATIVE
PH: 5 (ref 5.0–8.0)
PROTEIN: NEGATIVE mg/dL
Protein, ur: NEGATIVE mg/dL
SPECIFIC GRAVITY, URINE: 1.021 (ref 1.005–1.030)
Specific Gravity, Urine: 1.021 (ref 1.005–1.030)
Urobilinogen, UA: 0.2 mg/dL (ref 0.0–1.0)
Urobilinogen, UA: 0.2 mg/dL (ref 0.0–1.0)
pH: 5.5 (ref 5.0–8.0)

## 2014-11-01 LAB — GLUCOSE, CAPILLARY
GLUCOSE-CAPILLARY: 100 mg/dL — AB (ref 65–99)
GLUCOSE-CAPILLARY: 91 mg/dL (ref 65–99)
Glucose-Capillary: 93 mg/dL (ref 65–99)
Glucose-Capillary: 79 mg/dL (ref 65–99)

## 2014-11-01 LAB — COMPREHENSIVE METABOLIC PANEL
ALBUMIN: 3.3 g/dL — AB (ref 3.5–5.0)
ALK PHOS: 88 U/L (ref 38–126)
ALT: 14 U/L (ref 14–54)
ANION GAP: 7 (ref 5–15)
AST: 18 U/L (ref 15–41)
BUN: 26 mg/dL — ABNORMAL HIGH (ref 6–20)
CALCIUM: 9 mg/dL (ref 8.9–10.3)
CO2: 29 mmol/L (ref 22–32)
Chloride: 104 mmol/L (ref 101–111)
Creatinine, Ser: 1.03 mg/dL — ABNORMAL HIGH (ref 0.44–1.00)
GFR calc non Af Amer: 55 mL/min — ABNORMAL LOW (ref >60)
GLUCOSE: 142 mg/dL — AB (ref 65–99)
Potassium: 5 mmol/L (ref 3.5–5.1)
SODIUM: 140 mmol/L (ref 135–145)
Total Bilirubin: 0.3 mg/dL (ref 0.3–1.2)
Total Protein: 6.2 g/dL — ABNORMAL LOW (ref 6.5–8.1)

## 2014-11-01 LAB — CBC
HCT: 37.9 % (ref 36.0–46.0)
Hemoglobin: 12.3 g/dL (ref 12.0–15.0)
MCH: 30.7 pg (ref 26.0–34.0)
MCHC: 32.5 g/dL (ref 30.0–36.0)
MCV: 94.5 fL (ref 78.0–100.0)
Platelets: 222 10*3/uL (ref 150–400)
RBC: 4.01 MIL/uL (ref 3.87–5.11)
RDW: 13.7 % (ref 11.5–15.5)
WBC: 9.5 10*3/uL (ref 4.0–10.5)

## 2014-11-01 LAB — RAPID URINE DRUG SCREEN, HOSP PERFORMED
Amphetamines: NOT DETECTED
BARBITURATES: NOT DETECTED
Benzodiazepines: POSITIVE — AB
Cocaine: NOT DETECTED
Opiates: POSITIVE — AB
TETRAHYDROCANNABINOL: NOT DETECTED

## 2014-11-01 LAB — CREATININE, SERUM
Creatinine, Ser: 0.81 mg/dL (ref 0.44–1.00)
GFR calc Af Amer: >60 mL/min (ref >60)
GFR calc non Af Amer: >60 mL/min (ref >60)

## 2014-11-01 LAB — ETHANOL

## 2014-11-01 LAB — PHOSPHORUS: Phosphorus: 5 mg/dL — ABNORMAL HIGH (ref 2.5–4.6)

## 2014-11-01 LAB — CBG MONITORING, ED: GLUCOSE-CAPILLARY: 113 mg/dL — AB (ref 65–99)

## 2014-11-01 LAB — MAGNESIUM: Magnesium: 1.9 mg/dL (ref 1.7–2.4)

## 2014-11-01 MED ORDER — INSULIN ASPART 100 UNIT/ML ~~LOC~~ SOLN
0.0000 [IU] | Freq: Three times a day (TID) | SUBCUTANEOUS | Status: DC
  Administered 2014-11-02: 2 [IU] via SUBCUTANEOUS
  Administered 2014-11-03: 3 [IU] via SUBCUTANEOUS
  Administered 2014-11-03: 2 [IU] via SUBCUTANEOUS
  Administered 2014-11-04: 1 [IU] via SUBCUTANEOUS

## 2014-11-01 MED ORDER — ONDANSETRON HCL 4 MG PO TABS: 4.0000 mg | ORAL_TABLET | Freq: Four times a day (QID) | ORAL | Status: DC | PRN

## 2014-11-01 MED ORDER — NALOXONE HCL 1 MG/ML IJ SOLN
1.0000 mg | Freq: Once | INTRAMUSCULAR | Status: AC
  Administered 2014-11-01: 1 mg via INTRAVENOUS
  Filled 2014-11-01: qty 2

## 2014-11-01 MED ORDER — SODIUM CHLORIDE 0.9 % IV BOLUS (SEPSIS)
1000.0000 mL | Freq: Once | INTRAVENOUS | Status: AC
  Administered 2014-11-01: 1000 mL via INTRAVENOUS

## 2014-11-01 MED ORDER — SODIUM CHLORIDE 0.9 % IV SOLN
INTRAVENOUS | Status: DC
  Administered 2014-11-01: 1000 mL via INTRAVENOUS
  Administered 2014-11-01: 06:00:00 via INTRAVENOUS
  Administered 2014-11-02: 75 mL/h via INTRAVENOUS
  Administered 2014-11-02: 01:00:00 via INTRAVENOUS

## 2014-11-01 MED ORDER — GADOBENATE DIMEGLUMINE 529 MG/ML IV SOLN: 13.0000 mL | Freq: Once | INTRAVENOUS | Status: AC | PRN

## 2014-11-01 MED ORDER — ONDANSETRON HCL 4 MG/2ML IJ SOLN: 4.0000 mg | Freq: Four times a day (QID) | INTRAMUSCULAR | Status: DC | PRN

## 2014-11-01 MED ORDER — SODIUM CHLORIDE 0.9 % IV SOLN: INTRAVENOUS | Status: AC

## 2014-11-01 MED ORDER — ENOXAPARIN SODIUM 40 MG/0.4ML ~~LOC~~ SOLN
40.0000 mg | SUBCUTANEOUS | Status: DC
  Administered 2014-11-01 – 2014-11-04 (×4): 40 mg via SUBCUTANEOUS
  Filled 2014-11-01 (×4): qty 0.4

## 2014-11-01 NOTE — ED Notes (Signed)
Per EMS, family called due to pt having a seizure for 10-12 minutes. Pt kept having seizure activity with EMS. Pt was breathing 4 times per minute with assisted ventilations. Pt receive 2.5 of midazolam and respirations increased to 16. 1.2 of narcan given by EMS without response. Hypotensiive 60/30, 547ml bolus given. Last BP 94/60. Pt now responding to painful stimuli. Pt disoriented x 4 and only alert to painful stimuli. EMS VS 94/60, HR 80, sat 92% on RA

## 2014-11-01 NOTE — Progress Notes (Signed)
Patient admitted to the hospital earlier this morning by Dr. Wynelle Cleveland  Patient seen and examined. She is still somnolent/post ictal. She opens eyes to voice, but does not answer questions or follows commands. She quickly falls back asleep.  She has been admitted to the hospital after a prolonged seizure. Neurology is following her. She is undergoing further work up like MRI. May need EEG. She is not on any anticonvulsants at this time. Will defer initiation of anti convulsants to neuro. Continue to monitor.  Crystal Thornton

## 2014-11-01 NOTE — H&P (Addendum)
[facility] History and Physical  [name]:[id number] DOB: Nov 11, 1945 DOA: 11/01/2014   PCP: Cathlean Cower, MD    Chief Complaint: seizure  HPI: Crystal Thornton is a 69 y.o. female  With HTN, HLP, depression brought in by EMS for a seizure. The patient is unresponsive and history is given by the ER doctor. No family at bedside. Given 2.5 mg of Valium in route. Seizure was described as tonic clonic lasting for 10-15 min. She became hypotensive and had shallow breathing and had to be bagged at one point. She has since been unresponsive. She was given Narcan by the EMS and again in the ER without results. She has been in the ER for 4 hrs and is still not awakening.    ROS: unobtainable as patient is not responsive.  Past Medical History  Diagnosis Date  . Depression   . Diabetes mellitus   . Hyperlipidemia   . Hypertension   . Osteopenia   . Anxiety   . Pain     R. shoulder and lower back and legs  . Hepatitis C     Treated and no longer has  . ANGIOEDEMA 01/24/2010  . ANXIETY 11/07/2006  . BACK PAIN 11/01/2007  . Cough 05/29/2008  . CYST, OVARIAN NEC/NOS 11/05/2006  . DEPRESSION 11/07/2006  . DIABETES MELLITUS, TYPE II 11/07/2006  . DIVERTICULOSIS, COLON 11/07/2006  . FIBROCYSTIC BREAST DISEASE 11/05/2006  . HEAD TRAUMA, CLOSED 01/24/2010  . HEPATITIS B, HX OF 11/05/2006  . HEPATITIS C 11/05/2006  . HYPERLIPIDEMIA 11/07/2006  . HYPERTENSION 05/29/2007  . INSOMNIA, HX OF 11/05/2006  . LOW BACK PAIN 05/29/2007  . OSTEOPENIA 11/07/2006  . Other specified sites of sprains and strains 08/31/2008  . PULMONARY NODULE, LEFT UPPER LOBE 05/31/2009  . SHOULDER PAIN, RIGHT, CHRONIC 06/02/2010  . SINUSITIS- ACUTE-NOS 12/10/2007  . SKIN LESION 06/02/2010  . TOTAL ABDOMINAL HYSTERECTOMY, HX OF 03/27/1990  . Wears glasses   . Wears dentures     full top  . Seizures     Past Surgical History  Procedure Laterality Date  . Tubal ligation    . Cesarean section    . Shoulder arthroscopy  2008     left  . Ovarian cyst surgery      ruptured  . Appendectomy    . Colonoscopy    . Liver biopsy    . Cervical fusion  2013  . Orif ankle fracture Left 12/03/2012  . External fixation leg Left 12/12/2012    Procedure: LEFT ADJUSTMENT OF EXTERNAL FIXATION UNDER ANESTHESIA;  Surgeon: Wylene Simmer, MD;  Location: [facility];  Service: Orthopedics;  Laterality: Left;  . Orif ankle fracture Left 01/02/2013    Procedure: LEFT ANKLE REMOVAL OF EXTERNAL FIXATOR OPEN REDUCTION INTERNAL FIXATION (ORIF) TRIMALLEOLAR FRACTURE;  Surgeon: Wylene Simmer, MD;  Location: [facility];  Service: Orthopedics;  Laterality: Left;    Social History: unable to obtain    Allergies  Allergen Reactions  . Naproxen Sodium Anaphylaxis  . Sulfonamide Derivatives Other (See Comments)    Causes nephritis  . Ace Inhibitors Swelling    Lip swelling    Family history:   Family History  Problem Relation Age of Onset  . Liver cancer Mother       Prior to Admission medications   Medication Sig Start Date End Date Taking? Authorizing Provider  ALPRAZolam Duanne Moron) 1 MG tablet TAKE 1 TABLET BY MOUTH THREE TIMES DAILYAS NEEDED 07/10/14   Biagio Borg,  MD  amoxicillin-clavulanate (AUGMENTIN) 875-125 MG per tablet Take 1 tablet by mouth 2 (two) times daily. 08/12/14   Biagio Borg, MD  aspirin EC 325 MG tablet Take 325 mg by mouth daily.    Historical Provider, MD  atenolol (TENORMIN) 50 MG tablet Take 1 tablet (50 mg total) by mouth daily. 08/12/14   Biagio Borg, MD  B Complex-C (B-COMPLEX WITH VITAMIN C) tablet Take 1 tablet by mouth daily.    Historical Provider, MD  Evelina Bucy BILOBA COMPLEX PO Take 1 tablet by mouth daily.    Historical Provider, MD  Ginseng 500 MG CAPS Take 1 capsule by mouth daily.    Historical Provider, MD  metFORMIN (GLUCOPHAGE) 500 MG tablet Take 2 tablets (1,000 mg total) by mouth daily with breakfast. 08/12/14   Biagio Borg, MD  Multiple Vitamins-Minerals (CENTRUM SILVER PO)  Take 1 tablet by mouth daily.      Historical Provider, MD  oxycodone (ROXICODONE) 30 MG immediate release tablet Take 30 mg by mouth 5 (five) times daily as needed for pain.     Historical Provider, MD  pravastatin (PRAVACHOL) 40 MG tablet Take 2 tablets (80 mg total) by mouth daily. 08/12/14   Biagio Borg, MD  sertraline (ZOLOFT) 100 MG tablet 2 tabs by mouth per day 08/12/14   Biagio Borg, MD  tiZANidine (ZANAFLEX) 2 MG tablet Take 1 tablet (2 mg total) by mouth 2 (two) times daily as needed (for muscle relaxation / sleep). 07/23/13   Biagio Borg, MD  varenicline (CHANTIX CONTINUING MONTH PAK) 1 MG tablet Take 1 tablet (1 mg total) by mouth 2 (two) times daily. 08/12/14   Biagio Borg, MD  varenicline (CHANTIX STARTING MONTH PAK) 0.5 MG X 11 & 1 MG X 42 tablet Take one 0.5 mg tablet by mouth once daily for 3 days, then increase to one 0.5 mg tablet twice daily for 4 days, then increase to one 1 mg tablet twice daily. 08/12/14   Biagio Borg, MD     Physical Exam: Filed Vitals:   11/01/14 0400 11/01/14 0415 11/01/14 0432 11/01/14 0445  BP: 96/55 108/65 92/59 108/84  Pulse: 60 62 64 64  Temp:      TempSrc:      Resp: 7 11 19 19   SpO2: 99% 100% 98% 100%     General: eyes closed, verbally unresponsive- grimaces to being examined. Lifts up her arms involuntarily.  HEENT: Normocephalic and Atraumatic,                  PERRLA; EOM intact; No scleral icterus,                 Nares: Patent                Neck: FROM, no cervical lymphadenopathy, thyromegaly, carotid bruit or JVD;  Breasts: deferred CHEST WALL: No tenderness  CHEST: Normal respiration, clear to auscultation bilaterally  HEART: Regular rate and rhythm; no murmurs rubs or gallops  BACK: No kyphosis or scoliosis; no CVA tenderness  GI: Positive Bowel Sounds, soft, non-tender; no masses, no organomegaly Rectal Exam: deferred MSK: No cyanosis, clubbing, or edema Genitalia: not examined  SKIN:  no rash or ulceration  CNS: Alert  and Oriented x 4, extremities flaccid, CN 2-12 grossly appear intact  Labs on Admission:  Basic Metabolic Panel:  Recent Labs Lab 11/01/14 0108  NA 140  K 5.0  CL 104  CO2 29  GLUCOSE 142*  BUN 26*  CREATININE 1.03*  CALCIUM 9.0   Liver Function Tests:  Recent Labs Lab 11/01/14 0108  AST 18  ALT 14  ALKPHOS 88  BILITOT 0.3  PROT 6.2*  ALBUMIN 3.3*   No results for input(s): LIPASE, AMYLASE in the last 168 hours. No results for input(s): AMMONIA in the last 168 hours. CBC:  Recent Labs Lab 11/01/14 0108  WBC 7.6  NEUTROABS 5.4  HGB 13.3  HCT 39.6  MCV 94.1  PLT 220   Cardiac Enzymes: No results for input(s): CKTOTAL, CKMB, CKMBINDEX, TROPONINI in the last 168 hours.  BNP (last 3 results) No results for input(s): BNP in the last 8760 hours.  ProBNP (last 3 results) No results for input(s): PROBNP in the last 8760 hours.  CBG:  Recent Labs Lab 11/01/14 0205  GLUCAP 113*    Radiological Exams on Admission: Ct Head Wo Contrast  11/01/2014   CLINICAL DATA:  Seizure this morning. History of epilepsy, diabetes, hypertension, hepatitis.  EXAM: CT HEAD WITHOUT CONTRAST  TECHNIQUE: Contiguous axial images were obtained from the base of the skull through the vertex without intravenous contrast.  COMPARISON:  CT head Aug 15, 2003  FINDINGS: The ventricles and sulci are normal for age. No intraparenchymal hemorrhage, mass effect nor midline shift. Patchy supratentorial white matter hypodensities are within normal range for patient's age and though non-specific suggest sequelae of chronic small vessel ischemic disease. No acute large vascular territory infarcts.  No abnormal extra-axial fluid collections. Basal cisterns are patent. Moderate calcific atherosclerosis of the carotid siphons.  No skull fracture. New subcentimeter sclerotic focus in RIGHT clivus/P trace apex, axial 12/81. The included ocular globes and orbital contents are non-suspicious. The mastoid aircells  and included paranasal sinuses are well-aerated.  IMPRESSION: No acute intracranial process.  Involutional changes.  Mild chronic small vessel ischemic disease.  New 1 cm sclerotic lesion RIGHT skullbase, there is a history of cancer, consider bone scan. Alternatively, this could be further characterized with MRI with contrast, and fat saturated sequences on a nonemergent basis.   Electronically Signed   By: Elon Alas M.D.   On: 11/01/2014 04:06       Assessment/Plan Principal Problem:   Seizure - tonic clonic - h/o seizures mentioned in chart but not on medications - now post ictal - CT head reveals a sclerotic lesion in right skull base - ER Dr Leonides Schanz has spoken with Neuro Dr Doy Mince- asked by Dr Doy Mince to hold off on seizure medications- she will come and evaluate the patient and decide on further management  Active Problems:   Essential hypertension - currently hypotensive- hold Atenolol    ARF (acute renal failure) - mild- hydrate and follow    Depression - hold Zoloft and xanax until more alert - received a dose of IV Versed by EMS- if not alert to take orals by tomorrow, will need to give IV benzodiazepines to prevent withdrawl  DM - hold Metformin - place on low dose Insulin sliding scale Q4  Chronic pain - on Roxicodone 30 mg 5 x a day - as with benzodiazepines, if not alert enough to take orals, will need IV narcotics to prevent withdrawl  Consulted:   Code Status: assumed full code   Family Communication:   DVT Prophylaxis:Lovenox  Time spent: 16 min  [place], MD [facility]  If 7PM-7AM, please contact night-coverage [email or web address] 11/01/2014, 5:21 AM

## 2014-11-01 NOTE — ED Notes (Signed)
Pt back from CT hooked back up to the monitor with the 5 lead, BP cuff and pulse ox

## 2014-11-01 NOTE — ED Notes (Signed)
Pt arousable to speech but still not talking. Pt follows all commands but will not talk.

## 2014-11-01 NOTE — Progress Notes (Signed)
Provider contacted for order to in/out cath, this was completed around 1830 and he also  Indicated that if she did not urinate in the next 6 hours to put a foley catheter in. Family came in right after in/out performed and she started talking to them after not responding all day. Night nurse informed of these occurences.

## 2014-11-01 NOTE — ED Notes (Signed)
CBG: 113 °

## 2014-11-01 NOTE — Consult Note (Signed)
Reason for Consult:Seizure Referring Physician: Ward  CC: Seizure  HPI: Crystal Thornton is an 69 y.o. female who is unable to provide any history today.  Family not available at the time of evaluation.  All history obtained from the chart.  It appears that the patient has a history of seizures.  Tonight had a seizure at home prior to presentation.  EMS was called.  Seizure was prolonged (10-15 minutes) and required 2.5mg  of Versed en route.  Patient now poorly responsive.  Unclear when last seizure was.    Past Medical History  Diagnosis Date  . Depression   . Diabetes mellitus   . Hyperlipidemia   . Hypertension   . Osteopenia   . Anxiety   . Pain     R. shoulder and lower back and legs  . Hepatitis C     Treated and no longer has  . ANGIOEDEMA 01/24/2010  . ANXIETY 11/07/2006  . BACK PAIN 11/01/2007  . Cough 05/29/2008  . CYST, OVARIAN NEC/NOS 11/05/2006  . DEPRESSION 11/07/2006  . DIABETES MELLITUS, TYPE II 11/07/2006  . DIVERTICULOSIS, COLON 11/07/2006  . FIBROCYSTIC BREAST DISEASE 11/05/2006  . HEAD TRAUMA, CLOSED 01/24/2010  . HEPATITIS B, HX OF 11/05/2006  . HEPATITIS C 11/05/2006  . HYPERLIPIDEMIA 11/07/2006  . HYPERTENSION 05/29/2007  . INSOMNIA, HX OF 11/05/2006  . LOW BACK PAIN 05/29/2007  . OSTEOPENIA 11/07/2006  . Other specified sites of sprains and strains 08/31/2008  . PULMONARY NODULE, LEFT UPPER LOBE 05/31/2009  . SHOULDER PAIN, RIGHT, CHRONIC 06/02/2010  . SINUSITIS- ACUTE-NOS 12/10/2007  . SKIN LESION 06/02/2010  . TOTAL ABDOMINAL HYSTERECTOMY, HX OF 03/27/1990  . Wears glasses   . Wears dentures     full top  . Seizures     Past Surgical History  Procedure Laterality Date  . Tubal ligation    . Cesarean section    . Shoulder arthroscopy  2008    left  . Ovarian cyst surgery      ruptured  . Appendectomy    . Colonoscopy    . Liver biopsy    . Cervical fusion  2013  . Orif ankle fracture Left 12/03/2012  . External fixation leg Left 12/12/2012    Procedure: LEFT  ADJUSTMENT OF EXTERNAL FIXATION UNDER ANESTHESIA;  Surgeon: Wylene Simmer, MD;  Location: [facility];  Service: Orthopedics;  Laterality: Left;  . Orif ankle fracture Left 01/02/2013    Procedure: LEFT ANKLE REMOVAL OF EXTERNAL FIXATOR OPEN REDUCTION INTERNAL FIXATION (ORIF) TRIMALLEOLAR FRACTURE;  Surgeon: Wylene Simmer, MD;  Location: [facility];  Service: Orthopedics;  Laterality: Left;    Family History  Problem Relation Age of Onset  . Liver cancer Mother     Social History:  reports that she has been smoking Cigarettes.  She has been smoking about 0.25 packs per day. She has never used smokeless tobacco. She reports that she does not drink alcohol or use illicit drugs.  Allergies  Allergen Reactions  . Naproxen Sodium Anaphylaxis  . Sulfonamide Derivatives Other (See Comments)    Causes nephritis  . Ace Inhibitors Swelling    Lip swelling    Medications: I have reviewed the patient's current medications. Prior to Admission:  Prior to Admission medications   Medication Sig Start Date End Date Taking? Authorizing Provider  ALPRAZolam Duanne Moron) 1 MG tablet TAKE 1 TABLET BY MOUTH THREE TIMES DAILYAS NEEDED 07/10/14   Biagio Borg, MD  amoxicillin-clavulanate (AUGMENTIN) 875-125 MG per tablet  Take 1 tablet by mouth 2 (two) times daily. 08/12/14   Biagio Borg, MD  aspirin EC 325 MG tablet Take 325 mg by mouth daily.    Historical Provider, MD  atenolol (TENORMIN) 50 MG tablet Take 1 tablet (50 mg total) by mouth daily. 08/12/14   Biagio Borg, MD  B Complex-C (B-COMPLEX WITH VITAMIN C) tablet Take 1 tablet by mouth daily.    Historical Provider, MD  Evelina Bucy BILOBA COMPLEX PO Take 1 tablet by mouth daily.    Historical Provider, MD  Ginseng 500 MG CAPS Take 1 capsule by mouth daily.    Historical Provider, MD  metFORMIN (GLUCOPHAGE) 500 MG tablet Take 2 tablets (1,000 mg total) by mouth daily with breakfast. 08/12/14   Biagio Borg, MD  Multiple Vitamins-Minerals  (CENTRUM SILVER PO) Take 1 tablet by mouth daily.      Historical Provider, MD  oxycodone (ROXICODONE) 30 MG immediate release tablet Take 30 mg by mouth 5 (five) times daily as needed for pain.     Historical Provider, MD  pravastatin (PRAVACHOL) 40 MG tablet Take 2 tablets (80 mg total) by mouth daily. 08/12/14   Biagio Borg, MD  sertraline (ZOLOFT) 100 MG tablet 2 tabs by mouth per day 08/12/14   Biagio Borg, MD  tiZANidine (ZANAFLEX) 2 MG tablet Take 1 tablet (2 mg total) by mouth 2 (two) times daily as needed (for muscle relaxation / sleep). 07/23/13   Biagio Borg, MD  varenicline (CHANTIX CONTINUING MONTH PAK) 1 MG tablet Take 1 tablet (1 mg total) by mouth 2 (two) times daily. 08/12/14   Biagio Borg, MD  varenicline (CHANTIX STARTING MONTH PAK) 0.5 MG X 11 & 1 MG X 42 tablet Take one 0.5 mg tablet by mouth once daily for 3 days, then increase to one 0.5 mg tablet twice daily for 4 days, then increase to one 1 mg tablet twice daily. 08/12/14   Biagio Borg, MD    ROS: Unable to obtain due to mental status  Physical Examination: Blood pressure 93/56, pulse 57, temperature 98 F (36.7 C), temperature source Axillary, resp. rate 11, SpO2 100 %.  HEENT-  Normocephalic, no lesions, without obvious abnormality.  Normal external eye and conjunctiva.  Normal TM's bilaterally.  Normal auditory canals and external ears. Normal external nose, mucus membranes and septum.  Normal pharynx. Cardiovascular- S1, S2 normal, pulses palpable throughout   Lungs- chest clear, no wheezing, rales, normal symmetric air entry Abdomen- soft, non-tender; bowel sounds normal; no masses,  no organomegaly Extremities- no edema Lymph-no adenopathy palpable Musculoskeletal-no joint tenderness, deformity or swelling Skin-warm and dry, no hyperpigmentation, vitiligo, or suspicious lesions  Neurological Examination Mental Status: Lethargic.  Grunts when name called and responds with some short verbal responses.  Follows  some simple commands.  Localizes to pain.   Cranial Nerves: II: Discs flat bilaterally; Blinks to bilateral confrontation.  Pupils equal, round, reactive to light and accommodation III,IV, VI: ptosis not present, extra-ocular motions intact bilaterally V,VII: intact corneals bilaterally, strong eye closure bilaerally VIII: hearing normal bilaterally IX,X: gag reflex present XI: bilateral shoulder shrug XII: unable to test due to cooperation Motor: Lifts all extremities weakly but symmetrically against gravity to command.   Sensory: Responds to light noxious stimuli throughout.   Deep Tendon Reflexes: 2+ and symmetric throughout Plantars: Right: downgoing   Left: downgoing Cerebellar: unable to test due to cooperation Gait: not tested due to safety concerns.     Laboratory  Studies:   Basic Metabolic Panel:  Recent Labs Lab 11/01/14 0108  NA 140  K 5.0  CL 104  CO2 29  GLUCOSE 142*  BUN 26*  CREATININE 1.03*  CALCIUM 9.0    Liver Function Tests:  Recent Labs Lab 11/01/14 0108  AST 18  ALT 14  ALKPHOS 88  BILITOT 0.3  PROT 6.2*  ALBUMIN 3.3*   No results for input(s): LIPASE, AMYLASE in the last 168 hours. No results for input(s): AMMONIA in the last 168 hours.  CBC:  Recent Labs Lab 11/01/14 0108  WBC 7.6  NEUTROABS 5.4  HGB 13.3  HCT 39.6  MCV 94.1  PLT 220    Cardiac Enzymes: No results for input(s): CKTOTAL, CKMB, CKMBINDEX, TROPONINI in the last 168 hours.  BNP: Invalid input(s): POCBNP  CBG:  Recent Labs Lab 11/01/14 0205  GLUCAP 113*    Microbiology: Results for orders placed or performed during the hospital encounter of 02/11/10  Surgical pcr screen     Status: None   Collection Time: 02/11/10  1:17 PM  Result Value Ref Range Status   MRSA, PCR NEGATIVE NEGATIVE Final   Staphylococcus aureus  NEGATIVE Final    NEGATIVE        The Xpert SA Assay (FDA approved for NASAL specimens only), is one component of a comprehensive  surveillance program.  It is not intended to diagnose infection nor to guide or monitor treatment.    Coagulation Studies: No results for input(s): LABPROT, INR in the last 72 hours.  Urinalysis:  Recent Labs Lab 11/01/14 0118  COLORURINE AMBER*  LABSPEC 1.021  PHURINE 5.5  GLUCOSEU NEGATIVE  HGBUR NEGATIVE  BILIRUBINUR NEGATIVE  KETONESUR NEGATIVE  PROTEINUR NEGATIVE  UROBILINOGEN 0.2  NITRITE NEGATIVE  LEUKOCYTESUR NEGATIVE    Lipid Panel:     Component Value Date/Time   CHOL 178 08/12/2014 1724   TRIG 102.0 08/12/2014 1724   HDL 56.40 08/12/2014 1724   CHOLHDL 3 08/12/2014 1724   VLDL 20.4 08/12/2014 1724   LDLCALC 101* 08/12/2014 1724    HgbA1C:  Lab Results  Component Value Date   HGBA1C 6.2 08/12/2014    Urine Drug Screen:     Component Value Date/Time   LABOPIA POSITIVE* 11/01/2014 0118   COCAINSCRNUR NONE DETECTED 11/01/2014 0118   LABBENZ POSITIVE* 11/01/2014 0118   AMPHETMU NONE DETECTED 11/01/2014 0118   THCU NONE DETECTED 11/01/2014 0118   LABBARB NONE DETECTED 11/01/2014 0118    Alcohol Level: No results for input(s): ETH in the last 168 hours.   Imaging: Ct Head Wo Contrast  11/01/2014   CLINICAL DATA:  Seizure this morning. History of epilepsy, diabetes, hypertension, hepatitis.  EXAM: CT HEAD WITHOUT CONTRAST  TECHNIQUE: Contiguous axial images were obtained from the base of the skull through the vertex without intravenous contrast.  COMPARISON:  CT head Aug 15, 2003  FINDINGS: The ventricles and sulci are normal for age. No intraparenchymal hemorrhage, mass effect nor midline shift. Patchy supratentorial white matter hypodensities are within normal range for patient's age and though non-specific suggest sequelae of chronic small vessel ischemic disease. No acute large vascular territory infarcts.  No abnormal extra-axial fluid collections. Basal cisterns are patent. Moderate calcific atherosclerosis of the carotid siphons.  No skull fracture.  New subcentimeter sclerotic focus in RIGHT clivus/P trace apex, axial 12/81. The included ocular globes and orbital contents are non-suspicious. The mastoid aircells and included paranasal sinuses are well-aerated.  IMPRESSION: No acute intracranial process.  Involutional changes.  Mild chronic small vessel ischemic disease.  New 1 cm sclerotic lesion RIGHT skullbase, there is a history of cancer, consider bone scan. Alternatively, this could be further characterized with MRI with contrast, and fat saturated sequences on a nonemergent basis.   Electronically Signed   By: Elon Alas M.D.   On: 11/01/2014 04:06     Assessment/Plan: 69 year old female with a history of seizures presenting after a prolonged witnessed seizure at home.  Patient now without clinical evidence of seizure.  Based on neurological examination does not appear to be continuing to have seizure activity.  Unclear cause of previous seizures, ?ETOH.  Patient on no anticonvulsant therapy.  On opiate therapy at home.  Head CT independently reviewed and shows no acute changes.  Without concern for continued seizure activity, STAT  Electroencephalogram not indicated.    Recommendations: 1.  D/C Chantix 2.  Continue daily Xanax 3.  ETOH level, Magnesium, Phosphorus 4.  Would not start anticonvulsant therapy at this time 5.  Seizure precautions 6.  Ativan prn seizure activity 7.  MRI of the brain with and without contrast   Alexis Goodell, MD [facility] [phone] 11/01/2014, 5:28 AM

## 2014-11-01 NOTE — ED Provider Notes (Signed)
TIME SEEN: 1:05 AM   CHIEF COMPLAINT: Seizure   HPI: HPI Comments:Level 5 Caveat: Altered mental Status.  Hx provided by EMS.  Crystal Thornton is a 69 y.o. female brought in by ambulance, who presents to the Emergency Department complaining of Seizure onset tonight. Per Ems pt had seizure tonight PTA. Ems states she was really stiff and had her jaw clinched shut. Pt does have Hx of epilepsy per family but does not appear that she is on any antiepileptics. She does take Xanax regularly. Family reports she has had 2 prior seizures. Per EMS, patient seizure lasted approximately 10-15 minutes. Did not stop until they gave her 2.5 mg of IV Versed. They state that she was hypotensive and hypoxic. They had to assist ventilation with BVM until patient arrived in the emergency department. No bowel or bladder incontinence.    ROS: Level V caveat for altered mental status  PAST MEDICAL HISTORY/PAST SURGICAL HISTORY:  Past Medical History  Diagnosis Date  . Depression   . Diabetes mellitus   . Hyperlipidemia   . Hypertension   . Osteopenia   . Anxiety   . Pain     R. shoulder and lower back and legs  . Hepatitis C     Treated and no longer has  . ANGIOEDEMA 01/24/2010  . ANXIETY 11/07/2006  . BACK PAIN 11/01/2007  . Cough 05/29/2008  . CYST, OVARIAN NEC/NOS 11/05/2006  . DEPRESSION 11/07/2006  . DIABETES MELLITUS, TYPE II 11/07/2006  . DIVERTICULOSIS, COLON 11/07/2006  . FIBROCYSTIC BREAST DISEASE 11/05/2006  . HEAD TRAUMA, CLOSED 01/24/2010  . HEPATITIS B, HX OF 11/05/2006  . HEPATITIS C 11/05/2006  . HYPERLIPIDEMIA 11/07/2006  . HYPERTENSION 05/29/2007  . INSOMNIA, HX OF 11/05/2006  . LOW BACK PAIN 05/29/2007  . OSTEOPENIA 11/07/2006  . Other specified sites of sprains and strains 08/31/2008  . PULMONARY NODULE, LEFT UPPER LOBE 05/31/2009  . SHOULDER PAIN, RIGHT, CHRONIC 06/02/2010  . SINUSITIS- ACUTE-NOS 12/10/2007  . SKIN LESION 06/02/2010  . TOTAL ABDOMINAL HYSTERECTOMY, HX OF 03/27/1990  . Wears glasses    . Wears dentures     full top  . Seizures     MEDICATIONS:  Prior to Admission medications   Medication Sig Start Date End Date Taking? Authorizing Provider  ALPRAZolam Duanne Moron) 1 MG tablet TAKE 1 TABLET BY MOUTH THREE TIMES DAILYAS NEEDED 07/10/14   Biagio Borg, MD  amoxicillin-clavulanate (AUGMENTIN) 875-125 MG per tablet Take 1 tablet by mouth 2 (two) times daily. 08/12/14   Biagio Borg, MD  aspirin EC 325 MG tablet Take 325 mg by mouth daily.    Historical Provider, MD  atenolol (TENORMIN) 50 MG tablet Take 1 tablet (50 mg total) by mouth daily. 08/12/14   Biagio Borg, MD  B Complex-C (B-COMPLEX WITH VITAMIN C) tablet Take 1 tablet by mouth daily.    Historical Provider, MD  Evelina Bucy BILOBA COMPLEX PO Take 1 tablet by mouth daily.    Historical Provider, MD  Ginseng 500 MG CAPS Take 1 capsule by mouth daily.    Historical Provider, MD  metFORMIN (GLUCOPHAGE) 500 MG tablet Take 2 tablets (1,000 mg total) by mouth daily with breakfast. 08/12/14   Biagio Borg, MD  Multiple Vitamins-Minerals (CENTRUM SILVER PO) Take 1 tablet by mouth daily.      Historical Provider, MD  oxycodone (ROXICODONE) 30 MG immediate release tablet Take 30 mg by mouth 5 (five) times daily as needed for pain.     Historical  Provider, MD  pravastatin (PRAVACHOL) 40 MG tablet Take 2 tablets (80 mg total) by mouth daily. 08/12/14   Biagio Borg, MD  sertraline (ZOLOFT) 100 MG tablet 2 tabs by mouth per day 08/12/14   Biagio Borg, MD  tiZANidine (ZANAFLEX) 2 MG tablet Take 1 tablet (2 mg total) by mouth 2 (two) times daily as needed (for muscle relaxation / sleep). 07/23/13   Biagio Borg, MD  varenicline (CHANTIX CONTINUING MONTH PAK) 1 MG tablet Take 1 tablet (1 mg total) by mouth 2 (two) times daily. 08/12/14   Biagio Borg, MD  varenicline (CHANTIX STARTING MONTH PAK) 0.5 MG X 11 & 1 MG X 42 tablet Take one 0.5 mg tablet by mouth once daily for 3 days, then increase to one 0.5 mg tablet twice daily for 4 days, then increase  to one 1 mg tablet twice daily. 08/12/14   Biagio Borg, MD    ALLERGIES:  Allergies  Allergen Reactions  . Naproxen Sodium Anaphylaxis  . Sulfonamide Derivatives Other (See Comments)    Causes nephritis  . Ace Inhibitors Swelling    Lip swelling    SOCIAL HISTORY:  History  Substance Use Topics  . Smoking status: Current Every Day Smoker -- 0.25 packs/day    Types: Cigarettes  . Smokeless tobacco: Never Used  . Alcohol Use: No    FAMILY HISTORY: Family History  Problem Relation Age of Onset  . Liver cancer Mother     EXAM: BP 99/55 mmHg  Pulse 58  Temp(Src) 97.9 F (36.6 C) (Axillary)  Resp 16  Wt 138 lb (62.596 kg)  SpO2 98% CONSTITUTIONAL: Patient is somnolent, arousable with voice and painful stimuli but cannot answer questions other than her name HEAD: Normocephalic EYES: Conjunctivae clear, PERRL ENT: normal nose; no rhinorrhea; moist mucous membranes; pharynx without lesions noted NECK: Supple, no meningismus, no LAD  CARD: RRR; S1 and S2 appreciated; no murmurs, no clicks, no rubs, no gallops RESP: Normal chest excursion without splinting or tachypnea; breath sounds clear and equal bilaterally; no wheezes, no rhonchi, no rales, no hypoxia or respiratory distress, speaking full sentences ABD/GI: Normal bowel sounds; non-distended; soft, non-tender, no rebound, no guarding, no peritoneal signs BACK:  The back appears normal and is non-tender to palpation, there is no CVA tenderness EXT: Normal ROM in all joints; non-tender to palpation; no edema; normal capillary refill; no cyanosis, no calf tenderness or swelling    SKIN: Normal color for age and race; warm NEURO: Moves all extremities equally, patient is somnolent but arousable   MEDICAL DECISION MAKING: Patient here with seizure. She currently appears to be postictal period was initially hypotensive and hypoxic with EMS but this has improved. Will obtain labs, urine and continue to monitor patient.  ED  PROGRESS: Patient's labs and urine are unremarkable other than urine drug positive for benzodiazepine's and opiates. She is prescribed both. Patient has been given Narcan in the ED in by EMS without any change in her mental status. Given she is still somnolent, will obtain head CT. Blood glucose is normal.    4:45 AM  PATIENT'S HEAD CT IS UNREMARKABLE. SHE HAS BEEN MONITORED FOR OVER 4 HOURS AND IS STILL DIFFICULT TO AROUSE. D/w Dr. Doy Mince with neurology.  She will have technician come in to perform EEG and she will see patient in consult. She recommends holding on antiepileptics at this time. She recommends admission to medicine. PCP is Dr. Ronnald Ramp.  5:00 AM  Discussed with hospitalist  for admission.     I personally performed the services described in this documentation, which was scribed in my presence. The recorded information has been reviewed and is accurate.    [place], DO 11/01/14 [phone]

## 2014-11-02 ENCOUNTER — Inpatient Hospital Stay (HOSPITAL_COMMUNITY): Payer: Commercial Managed Care - HMO

## 2014-11-02 DIAGNOSIS — I10 Essential (primary) hypertension: Secondary | ICD-10-CM

## 2014-11-02 DIAGNOSIS — G894 Chronic pain syndrome: Secondary | ICD-10-CM | POA: Insufficient documentation

## 2014-11-02 DIAGNOSIS — G9340 Encephalopathy, unspecified: Secondary | ICD-10-CM | POA: Diagnosis present

## 2014-11-02 DIAGNOSIS — E089 Diabetes mellitus due to underlying condition without complications: Secondary | ICD-10-CM

## 2014-11-02 DIAGNOSIS — R569 Unspecified convulsions: Secondary | ICD-10-CM

## 2014-11-02 DIAGNOSIS — N179 Acute kidney failure, unspecified: Secondary | ICD-10-CM

## 2014-11-02 LAB — COMPREHENSIVE METABOLIC PANEL
ALBUMIN: 3.1 g/dL — AB (ref 3.5–5.0)
ALK PHOS: 77 U/L (ref 38–126)
ALT: 13 U/L — ABNORMAL LOW (ref 14–54)
AST: 21 U/L (ref 15–41)
Anion gap: 9 (ref 5–15)
BILIRUBIN TOTAL: 0.7 mg/dL (ref 0.3–1.2)
BUN: 14 mg/dL (ref 6–20)
CALCIUM: 8.9 mg/dL (ref 8.9–10.3)
CO2: 25 mmol/L (ref 22–32)
CREATININE: 0.74 mg/dL (ref 0.44–1.00)
Chloride: 109 mmol/L (ref 101–111)
GLUCOSE: 103 mg/dL — AB (ref 65–99)
Potassium: 3.9 mmol/L (ref 3.5–5.1)
SODIUM: 143 mmol/L (ref 135–145)
Total Protein: 5.8 g/dL — ABNORMAL LOW (ref 6.5–8.1)

## 2014-11-02 LAB — GLUCOSE, CAPILLARY
GLUCOSE-CAPILLARY: 162 mg/dL — AB (ref 65–99)
GLUCOSE-CAPILLARY: 166 mg/dL — AB (ref 65–99)
GLUCOSE-CAPILLARY: 86 mg/dL (ref 65–99)
GLUCOSE-CAPILLARY: 94 mg/dL (ref 65–99)
Glucose-Capillary: 96 mg/dL (ref 65–99)
Glucose-Capillary: 93 mg/dL (ref 65–99)
Glucose-Capillary: 109 mg/dL — ABNORMAL HIGH (ref 65–99)

## 2014-11-02 MED ORDER — ATENOLOL 25 MG PO TABS
50.0000 mg | ORAL_TABLET | Freq: Every day | ORAL | Status: DC
  Administered 2014-11-02 – 2014-11-04 (×3): 50 mg via ORAL
  Filled 2014-11-02 (×3): qty 2

## 2014-11-02 MED ORDER — WHITE PETROLATUM GEL
Status: AC
  Administered 2014-11-02: 1
  Filled 2014-11-02: qty 1

## 2014-11-02 MED ORDER — OXYCODONE HCL 5 MG PO TABS
15.0000 mg | ORAL_TABLET | ORAL | Status: DC | PRN
  Administered 2014-11-02 – 2014-11-03 (×3): 15 mg via ORAL
  Filled 2014-11-02 (×3): qty 3

## 2014-11-02 MED ORDER — SERTRALINE HCL 100 MG PO TABS
200.0000 mg | ORAL_TABLET | Freq: Every day | ORAL | Status: DC
  Administered 2014-11-02 – 2014-11-04 (×3): 200 mg via ORAL
  Filled 2014-11-02 (×3): qty 2

## 2014-11-02 MED ORDER — NICOTINE 21 MG/24HR TD PT24
21.0000 mg | MEDICATED_PATCH | Freq: Every day | TRANSDERMAL | Status: DC
  Administered 2014-11-02 – 2014-11-04 (×3): 21 mg via TRANSDERMAL
  Filled 2014-11-02 (×2): qty 1

## 2014-11-02 MED ORDER — OXYCODONE HCL 5 MG PO TABS: 10.0000 mg | ORAL_TABLET | ORAL | Status: DC | PRN

## 2014-11-02 MED ORDER — PRAVASTATIN SODIUM 40 MG PO TABS
80.0000 mg | ORAL_TABLET | Freq: Every day | ORAL | Status: DC
  Administered 2014-11-03: 80 mg via ORAL
  Filled 2014-11-02 (×2): qty 2

## 2014-11-02 MED ORDER — ASPIRIN EC 325 MG PO TBEC
325.0000 mg | DELAYED_RELEASE_TABLET | Freq: Every day | ORAL | Status: DC
  Administered 2014-11-02 – 2014-11-04 (×3): 325 mg via ORAL
  Filled 2014-11-02 (×3): qty 1

## 2014-11-02 MED ORDER — NICOTINE 21 MG/24HR TD PT24
21.0000 mg | MEDICATED_PATCH | Freq: Every day | TRANSDERMAL | Status: DC
  Administered 2014-11-02: 21 mg via TRANSDERMAL
  Filled 2014-11-02: qty 1

## 2014-11-02 MED ORDER — ALPRAZOLAM 0.5 MG PO TABS
1.0000 mg | ORAL_TABLET | Freq: Three times a day (TID) | ORAL | Status: DC | PRN
  Administered 2014-11-02 – 2014-11-04 (×3): 1 mg via ORAL
  Filled 2014-11-02 (×3): qty 2

## 2014-11-02 NOTE — Progress Notes (Signed)
Subjective: Patient awake and oriented. States she had a seizure two years ago but could not give me any further information. Admits that she has been taking Xanax daily and also has been taking more than prescribed which caused her to run out early.  The last day she took her Xanax was on Friday.  She has not started the Chantix and states she has not picked up her Rx for that medication.   Objective: Current vital signs: BP 146/68 mmHg  Pulse 64  Temp(Src) 98.9 F (37.2 C) (Oral)  Resp 20  Wt 62.596 kg (138 lb)  SpO2 97% Vital signs in last 24 hours: Temp:  [97 F (36.1 C)-98.9 F (37.2 C)] 98.9 F (37.2 C) (08/08 0529) Pulse Rate:  [54-97] 64 (08/08 0529) Resp:  [12-20] 20 (08/08 0529) BP: (98-146)/(57-68) 146/68 mmHg (08/08 0529) SpO2:  [96 %-100 %] 97 % (08/08 0529)  Intake/Output from previous day: 08/07 0701 - 08/08 0700 In: -  Out: 2630 [Urine:2630] Intake/Output this shift:   Nutritional status:    Neurologic Exam: General: Mental Status: Alert, oriented, thought content appropriate.  Speech fluent without evidence of aphasia.  Able to follow 3 step commands without difficulty. Cranial Nerves: II: Visual fields grossly normal, pupils equal, round, reactive to light and accommodation III,IV, VI: ptosis not present, extra-ocular motions intact bilaterally V,VII: smile symmetric, facial light touch sensation normal bilaterally VIII: hearing normal bilaterally IX,X: uvula rises symmetrically XI: bilateral shoulder shrug XII: midline tongue extension without atrophy or fasciculations  Motor: Moving all extremities antigravity with 5/5 strength Sensory: Pinprick and light touch intact throughout, bilaterally Deep Tendon Reflexes:  Depressed 1+ throughout  Plantars: Right: downgoing   Left: downgoing Cerebellar: normal finger-to-nose,  normal heel-to-shin test    Lab Results: Basic Metabolic Panel:  Recent Labs Lab 11/01/14 0108 11/01/14 0730  NA 140   --   K 5.0  --   CL 104  --   CO2 29  --   GLUCOSE 142*  --   BUN 26*  --   CREATININE 1.03* 0.81  CALCIUM 9.0  --   MG  --  1.9  PHOS  --  5.0*    Liver Function Tests:  Recent Labs Lab 11/01/14 0108  AST 18  ALT 14  ALKPHOS 88  BILITOT 0.3  PROT 6.2*  ALBUMIN 3.3*   No results for input(s): LIPASE, AMYLASE in the last 168 hours. No results for input(s): AMMONIA in the last 168 hours.  CBC:  Recent Labs Lab 11/01/14 0108 11/01/14 0730  WBC 7.6 9.5  NEUTROABS 5.4  --   HGB 13.3 12.3  HCT 39.6 37.9  MCV 94.1 94.5  PLT 220 222    Cardiac Enzymes: No results for input(s): CKTOTAL, CKMB, CKMBINDEX, TROPONINI in the last 168 hours.  Lipid Panel: No results for input(s): CHOL, TRIG, HDL, CHOLHDL, VLDL, LDLCALC in the last 168 hours.  CBG:  Recent Labs Lab 11/01/14 1622 11/01/14 2034 11/02/14 0006 11/02/14 0358 11/02/14 0825  GLUCAP 91 79 86 96 72    Microbiology: Results for orders placed or performed during the hospital encounter of 02/11/10  Surgical pcr screen     Status: None   Collection Time: 02/11/10  1:17 PM  Result Value Ref Range Status   MRSA, PCR NEGATIVE NEGATIVE Final   Staphylococcus aureus  NEGATIVE Final    NEGATIVE        The Xpert SA Assay (FDA approved for NASAL specimens only), is one component of  a comprehensive surveillance program.  It is not intended to diagnose infection nor to guide or monitor treatment.    Coagulation Studies: No results for input(s): LABPROT, INR in the last 72 hours.  Imaging: Ct Head Wo Contrast  11/01/2014   CLINICAL DATA:  Seizure this morning. History of epilepsy, diabetes, hypertension, hepatitis.  EXAM: CT HEAD WITHOUT CONTRAST  TECHNIQUE: Contiguous axial images were obtained from the base of the skull through the vertex without intravenous contrast.  COMPARISON:  CT head Aug 15, 2003  FINDINGS: The ventricles and sulci are normal for age. No intraparenchymal hemorrhage, mass effect nor  midline shift. Patchy supratentorial white matter hypodensities are within normal range for patient's age and though non-specific suggest sequelae of chronic small vessel ischemic disease. No acute large vascular territory infarcts.  No abnormal extra-axial fluid collections. Basal cisterns are patent. Moderate calcific atherosclerosis of the carotid siphons.  No skull fracture. New subcentimeter sclerotic focus in RIGHT clivus/P trace apex, axial 12/81. The included ocular globes and orbital contents are non-suspicious. The mastoid aircells and included paranasal sinuses are well-aerated.  IMPRESSION: No acute intracranial process.  Involutional changes.  Mild chronic small vessel ischemic disease.  New 1 cm sclerotic lesion RIGHT skullbase, there is a history of cancer, consider bone scan. Alternatively, this could be further characterized with MRI with contrast, and fat saturated sequences on a nonemergent basis.   Electronically Signed   By: Elon Alas M.D.   On: 11/01/2014 04:06   Mr Jeri Cos ZJ Contrast  11/01/2014   CLINICAL DATA:  History of seizures, with recent exacerbation. History of diabetes, hypertension, and hepatitis. Urine drug screen positive for opiates and benzodiazepines.  EXAM: MRI HEAD WITHOUT AND WITH CONTRAST  TECHNIQUE: Multiplanar, multiecho pulse sequences of the brain and surrounding structures were obtained without and with intravenous contrast.  CONTRAST:  MultiHance 13 mL.  COMPARISON:  CT head earlier today.  FINDINGS: No evidence for acute infarction, hemorrhage, mass lesion, hydrocephalus, or extra-axial fluid. Normal for age cerebral volume. There is moderately advanced T2 and FLAIR hyperintensity in the mid pons, with only minor periventricular greater than subcortical signal abnormality in the supratentorial compartment. This is presumed to represent chronic microvascular ischemic change in this patient with hypertension and diabetes. Flow voids are maintained throughout  the carotid, basilar, and vertebral arteries. There are no areas of chronic hemorrhage. Pituitary, pineal, and cerebellar tonsils unremarkable. No upper cervical lesions.  Thin-section high-resolution T2 weighted images through the temporal lobes show no significant asymmetry or mass.  Post infusion, no abnormal enhancement of the brain. There is mild symmetric dural enhancement over the convexity, tentorium, and falx. Differential is wide, but it is unclear if this truly represents a pathologic state. Symmetric dural enhancement can be seen in infection, intracranial hypotension, cerebral venous thrombosis, neurosarcoid, or idiopathic, among others. There are no signs that the patient has had previous cranial surgery. No sinus air-fluid level is seen. Continued surveillance warranted. Lumbar puncture could be performed if concern for infection.  Unremarkable mastoids.  Negative orbits.  No osseous findings.  IMPRESSION: No restricted diffusion to suggest acute stroke for postictal phenomenon. No intracranial mass lesion or hemorrhage.  Normal for age cerebral volume with moderately prominent presumed chronic microvascular ischemic change in the brainstem, likely secondary to hypertension and/or diabetes.  Mild symmetric dural enhancement over the convexity, tentorium, and falx of uncertain significance. See discussion above.   Electronically Signed   By: Staci Righter M.D.   On: 11/01/2014 11:04  Medications:  Scheduled: . enoxaparin (LOVENOX) injection  40 mg Subcutaneous Q24H  . insulin aspart  0-9 Units Subcutaneous TID WC  . [START ON 11/03/2014] nicotine  21 mg Transdermal Daily    Assessment/Plan: 69 year old female with a history of seizures presenting after a prolonged witnessed seizure at home.Patietn admits to taking Xanax 1 mg TID if not more and had run out early.  Seizure likely provoked secondary to Xanax withdrawal.  MRI, Magnesium and phosphorus unrevealing  Recommend: 1) restart  Xanax 2) Do not restart Chantix  3) No AED unless EEG shows indications of increased risk for future seizure activity, or patient has a recurrent witnessed seizure.   Etta Quill PA-C [facility] [phone]  11/02/2014, 9:13 AM  I personally participated in this patient's evaluation and management, including formulating the above clinical impression and management recommendations.  Rush Farmer M.D. [facility] [phone]

## 2014-11-02 NOTE — Progress Notes (Signed)
EEG Completed; Results Pending  

## 2014-11-02 NOTE — Progress Notes (Signed)
[facility] PROGRESS NOTE  Crystal Thornton KGM:[id number] DOB: 1945/09/11 DOA: 11/01/2014 PCP: Cathlean Cower, MD  Assessment/Plan: #1 seizures/acute encephalopathy Questionable etiology. Patient stated to neurologist and to me that she had been taking Xanax 1 mg 3 times daily if not more and had run out early one day prior to admission. Per neurology feel this might be secondary to Xanax withdrawal. CT head negative. MRI head negative. Magnesium was 1.9. Phosphorus at 5.0. Xanax has been resumed when necessary per neurology. Neurology recommends not to resume Chantix. EEG pending. Neurology following and appreciate input and recommendations.  #2 hypertension Stable. Will resume home dose atenolol.  #3 acute renal failure Improved with hydration. Follow.  #4 depression Xanax has been resumed. Resume Zoloft.  #5 diabetes mellitus Hemoglobin A1c on 08/12/2014 was 6.2. CBGs have ranged from 93-109. Continue sliding scale insulin.  #6 chronic pain syndrome Will place on oxycodone 10 mg every 4 hours when necessary for now until we verify home narcotic regimen.  #7 prophylaxis Lovenox for DVT prophylaxis.  Code Status: Full Family Communication: Updated patient. No family present. Disposition Plan: Remain on telemetry. Pending EEG results and per neurology.   Consultants:  Neurology: Dr. Doy Mince 11/01/2014  Procedures:  EEG 11/02/2014  CT head 11/01/2014  MRI brain with and without contrast 11/01/2014  Antibiotics:  None  HPI/Subjective: Patient alert and oriented. Patient following commands. Patient complaining of neck pain and ankle pain. Patient asking whether she can have her chronic pain medications. Patient also states was on Xanax and was taking more than she needed and run out one day prior to admission. No seizures noted.  Objective: Filed Vitals:   11/02/14 0529  BP: 146/68  Pulse: 64  Temp: 98.9 F (37.2 C)  Resp: 20    Intake/Output Summary (Last  24 hours) at 11/02/14 1459 Last data filed at 11/02/14 0532  Gross per 24 hour  Intake      0 ml  Output   2630 ml  Net  -2630 ml   Filed Weights   11/01/14 0558  Weight: 62.596 kg (138 lb)    Exam:   General:  NAD  Cardiovascular: RRR  Respiratory: CTAB  Abdomen: soft, nontender, nondistended, positive bowel sounds.  Musculoskeletal: No clubbing cyanosis or edema.  Data Reviewed: Basic Metabolic Panel:  Recent Labs Lab 11/01/14 0108 11/01/14 0730 11/02/14 0757  NA 140  --  143  K 5.0  --  3.9  CL 104  --  109  CO2 29  --  25  GLUCOSE 142*  --  103*  BUN 26*  --  14  CREATININE 1.03* 0.81 0.74  CALCIUM 9.0  --  8.9  MG  --  1.9  --   PHOS  --  5.0*  --    Liver Function Tests:  Recent Labs Lab 11/01/14 0108 11/02/14 0757  AST 18 21  ALT 14 13*  ALKPHOS 88 77  BILITOT 0.3 0.7  PROT 6.2* 5.8*  ALBUMIN 3.3* 3.1*   No results for input(s): LIPASE, AMYLASE in the last 168 hours. No results for input(s): AMMONIA in the last 168 hours. CBC:  Recent Labs Lab 11/01/14 0108 11/01/14 0730  WBC 7.6 9.5  NEUTROABS 5.4  --   HGB 13.3 12.3  HCT 39.6 37.9  MCV 94.1 94.5  PLT 220 222   Cardiac Enzymes: No results for input(s): CKTOTAL, CKMB, CKMBINDEX, TROPONINI in the last 168 hours. BNP (last 3 results) No results for input(s): BNP in the  last 8760 hours.  ProBNP (last 3 results) No results for input(s): PROBNP in the last 8760 hours.  CBG:  Recent Labs Lab 11/01/14 2034 11/02/14 0006 11/02/14 0358 11/02/14 0825 11/02/14 1210  GLUCAP 79 86 96 93 109*    No results found for this or any previous visit (from the past 240 hour(s)).   Studies: Ct Head Wo Contrast  11/01/2014   CLINICAL DATA:  Seizure this morning. History of epilepsy, diabetes, hypertension, hepatitis.  EXAM: CT HEAD WITHOUT CONTRAST  TECHNIQUE: Contiguous axial images were obtained from the base of the skull through the vertex without intravenous contrast.  COMPARISON:  CT  head Aug 15, 2003  FINDINGS: The ventricles and sulci are normal for age. No intraparenchymal hemorrhage, mass effect nor midline shift. Patchy supratentorial white matter hypodensities are within normal range for patient's age and though non-specific suggest sequelae of chronic small vessel ischemic disease. No acute large vascular territory infarcts.  No abnormal extra-axial fluid collections. Basal cisterns are patent. Moderate calcific atherosclerosis of the carotid siphons.  No skull fracture. New subcentimeter sclerotic focus in RIGHT clivus/P trace apex, axial 12/81. The included ocular globes and orbital contents are non-suspicious. The mastoid aircells and included paranasal sinuses are well-aerated.  IMPRESSION: No acute intracranial process.  Involutional changes.  Mild chronic small vessel ischemic disease.  New 1 cm sclerotic lesion RIGHT skullbase, there is a history of cancer, consider bone scan. Alternatively, this could be further characterized with MRI with contrast, and fat saturated sequences on a nonemergent basis.   Electronically Signed   By: Elon Alas M.D.   On: 11/01/2014 04:06   Mr Jeri Cos JO Contrast  11/01/2014   CLINICAL DATA:  History of seizures, with recent exacerbation. History of diabetes, hypertension, and hepatitis. Urine drug screen positive for opiates and benzodiazepines.  EXAM: MRI HEAD WITHOUT AND WITH CONTRAST  TECHNIQUE: Multiplanar, multiecho pulse sequences of the brain and surrounding structures were obtained without and with intravenous contrast.  CONTRAST:  MultiHance 13 mL.  COMPARISON:  CT head earlier today.  FINDINGS: No evidence for acute infarction, hemorrhage, mass lesion, hydrocephalus, or extra-axial fluid. Normal for age cerebral volume. There is moderately advanced T2 and FLAIR hyperintensity in the mid pons, with only minor periventricular greater than subcortical signal abnormality in the supratentorial compartment. This is presumed to represent  chronic microvascular ischemic change in this patient with hypertension and diabetes. Flow voids are maintained throughout the carotid, basilar, and vertebral arteries. There are no areas of chronic hemorrhage. Pituitary, pineal, and cerebellar tonsils unremarkable. No upper cervical lesions.  Thin-section high-resolution T2 weighted images through the temporal lobes show no significant asymmetry or mass.  Post infusion, no abnormal enhancement of the brain. There is mild symmetric dural enhancement over the convexity, tentorium, and falx. Differential is wide, but it is unclear if this truly represents a pathologic state. Symmetric dural enhancement can be seen in infection, intracranial hypotension, cerebral venous thrombosis, neurosarcoid, or idiopathic, among others. There are no signs that the patient has had previous cranial surgery. No sinus air-fluid level is seen. Continued surveillance warranted. Lumbar puncture could be performed if concern for infection.  Unremarkable mastoids.  Negative orbits.  No osseous findings.  IMPRESSION: No restricted diffusion to suggest acute stroke for postictal phenomenon. No intracranial mass lesion or hemorrhage.  Normal for age cerebral volume with moderately prominent presumed chronic microvascular ischemic change in the brainstem, likely secondary to hypertension and/or diabetes.  Mild symmetric dural enhancement over the convexity,  tentorium, and falx of uncertain significance. See discussion above.   Electronically Signed   By: Staci Righter M.D.   On: 11/01/2014 11:04    Scheduled Meds: . enoxaparin (LOVENOX) injection  40 mg Subcutaneous Q24H  . insulin aspart  0-9 Units Subcutaneous TID WC  . [START ON 11/03/2014] nicotine  21 mg Transdermal Daily   Continuous Infusions: . sodium chloride 125 mL/hr at 11/02/14 0031    Principal Problem:   Seizure Active Problems:   Acute encephalopathy   Diabetes   Hyperlipidemia   Essential hypertension   ARF  (acute renal failure)   Depression   Altered mental status    Time spent: Conger MD [facility] Pager [phone]. If 7PM-7AM, please contact night-coverage at [email or web address], password [facility] 11/02/2014, 2:59 PM  LOS: 1 day

## 2014-11-02 NOTE — Progress Notes (Signed)
Pt refused to take her Pravastatin this evening, claiming that it had already been administered to her this morning.  She then made claims that staff were recording her conversations and that her phone was being tapped.

## 2014-11-02 NOTE — Progress Notes (Signed)
Patient has become confused and paranoid; constantly thinking police are outside the room coming to get her son.  Patient trying to pass me notes because she doesn't want to say anything out loud because they will hear her.  Patient will no longer wear her 2 liters of oxygen, she removes it every time I reapply it due to a sore ear.

## 2014-11-02 NOTE — Evaluation (Addendum)
Clinical/Bedside Swallow Evaluation Patient Details  Name: Crystal Thornton MRN: [id number] Date of Birth: 01-04-1946  Today's Date: 11/02/2014 Time: SLP Start Time (ACUTE ONLY): 75 SLP Stop Time (ACUTE ONLY): 0942 SLP Time Calculation (min) (ACUTE ONLY): 22 min  Past Medical History:  Past Medical History  Diagnosis Date  . Depression   . Diabetes mellitus   . Hyperlipidemia   . Hypertension   . Osteopenia   . Anxiety   . Pain     R. shoulder and lower back and legs  . Hepatitis C     Treated and no longer has  . ANGIOEDEMA 01/24/2010  . ANXIETY 11/07/2006  . BACK PAIN 11/01/2007  . Cough 05/29/2008  . CYST, OVARIAN NEC/NOS 11/05/2006  . DEPRESSION 11/07/2006  . DIABETES MELLITUS, TYPE II 11/07/2006  . DIVERTICULOSIS, COLON 11/07/2006  . FIBROCYSTIC BREAST DISEASE 11/05/2006  . HEAD TRAUMA, CLOSED 01/24/2010  . HEPATITIS B, HX OF 11/05/2006  . HEPATITIS C 11/05/2006  . HYPERLIPIDEMIA 11/07/2006  . HYPERTENSION 05/29/2007  . INSOMNIA, HX OF 11/05/2006  . LOW BACK PAIN 05/29/2007  . OSTEOPENIA 11/07/2006  . Other specified sites of sprains and strains 08/31/2008  . PULMONARY NODULE, LEFT UPPER LOBE 05/31/2009  . SHOULDER PAIN, RIGHT, CHRONIC 06/02/2010  . SINUSITIS- ACUTE-NOS 12/10/2007  . SKIN LESION 06/02/2010  . TOTAL ABDOMINAL HYSTERECTOMY, HX OF 03/27/1990  . Wears glasses   . Wears dentures     full top  . Seizures    Past Surgical History:  Past Surgical History  Procedure Laterality Date  . Tubal ligation    . Cesarean section    . Shoulder arthroscopy  2008    left  . Ovarian cyst surgery      ruptured  . Appendectomy    . Colonoscopy    . Liver biopsy    . Cervical fusion  2013  . Orif ankle fracture Left 12/03/2012  . External fixation leg Left 12/12/2012    Procedure: LEFT ADJUSTMENT OF EXTERNAL FIXATION UNDER ANESTHESIA;  Surgeon: Wylene Simmer, MD;  Location: [place];  Service: Orthopedics;  Laterality: Left;  . Orif ankle fracture Left 01/02/2013   Procedure: LEFT ANKLE REMOVAL OF EXTERNAL FIXATOR OPEN REDUCTION INTERNAL FIXATION (ORIF) TRIMALLEOLAR FRACTURE;  Surgeon: Wylene Simmer, MD;  Location[place];  Service: Orthopedics;  Laterality: Left;   HPI:  69 y.o. female  With HTN, HLP, depression brought in by EMS for a seizure on 11/01/14. The patient was unresponsive and history was given by the ER doctor. No family at bedside. Given 2.5 mg of Valium in route. Seizure was described as tonic clonic lasting for 10-15 min. She became hypotensive and had shallow breathing and had to be bagged at one point. She was unresponsive initially, but is now eager to begin a diet. MRI negative on 11/01/14; EEG pending  Assessment / Plan / Recommendation Clinical Impression   Pt was impulsive and somewhat confused/pyranoid during BSE indicating "I know the police are coming and you are part of helping them." Despite confusion/impulsivity with various consistencies, she did not exhibit any s/s of aspiration during BSE and appears to have a normal oropharyngeal swallow.  ST to f/u x1 for safety d/t impulsivity and possible cognitive concerns that may affect swallowing function/accuracy.    Aspiration Risk  None    Diet Recommendation Thin;Other (Comment) (Regular)   Medication Administration: Whole meds with liquid Compensations: Slow rate;Small sips/bites    Other  Recommendations Oral Care Recommendations: Oral  care BID   Follow Up Recommendations    n/a   Frequency and Duration min 1 x/week  1 week   Pertinent Vitals/Pain Elevated BP intermittently; afebrile    SLP Swallow Goals  See POC   Swallow Study Prior Functional Status   Independent at home    General Date of Onset: 11/01/14 Other Pertinent Information: 69 y.o. female  With HTN, HLP, depression brought in by EMS for a seizure. The patient is unresponsive and history is given by the ER doctor. No family at bedside. Given 2.5 mg of Valium in route. Seizure was  described as tonic clonic lasting for 10-15 min. She became hypotensive and had shallow breathing and had to be bagged at one point. She has since been unresponsive. She was given Narcan by the EMS and again in the ER without results.  Type of Study: Bedside swallow evaluation Previous Swallow Assessment: n/a Diet Prior to this Study: NPO Temperature Spikes Noted: No Respiratory Status: Room air History of Recent Intubation: No Behavior/Cognition: Alert;Cooperative;Confused Oral Cavity - Dentition: Poor condition;Missing dentition Self-Feeding Abilities: Able to feed self Patient Positioning: Upright in bed Baseline Vocal Quality: Hoarse Volitional Cough: Strong Volitional Swallow: Able to elicit    Oral/Motor/Sensory Function Overall Oral Motor/Sensory Function: Appears within functional limits for tasks assessed Labial Symmetry: Within Functional Limits Labial Strength: Within Functional Limits Lingual ROM: Within Functional Limits Lingual Symmetry: Within Functional Limits Lingual Strength: Within Functional Limits Facial Symmetry: Within Functional Limits Facial Strength: Within Functional Limits Velum: Within Functional Limits Mandible: Within Functional Limits   Ice Chips Ice chips: Not tested   Thin Liquid Thin Liquid: Within functional limits Presentation: Cup Other Comments: refused straw    Nectar Thick Nectar Thick Liquid: Not tested   Honey Thick Honey Thick Liquid: Not tested   Puree Puree: Within functional limits Presentation: Self Fed   Solid       Solid: Within functional limits Presentation: Self Fed Other Comments: impulsive       Ahilyn Nell,PAT, M.S., CCC-SLP 11/02/2014,10:01 AM
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
## 2014-11-03 DIAGNOSIS — F22 Delusional disorders: Secondary | ICD-10-CM | POA: Clinically undetermined

## 2014-11-03 DIAGNOSIS — R443 Hallucinations, unspecified: Secondary | ICD-10-CM | POA: Clinically undetermined

## 2014-11-03 DIAGNOSIS — F329 Major depressive disorder, single episode, unspecified: Secondary | ICD-10-CM

## 2014-11-03 LAB — BASIC METABOLIC PANEL
ANION GAP: 8 (ref 5–15)
BUN: 11 mg/dL (ref 6–20)
CO2: 24 mmol/L (ref 22–32)
Calcium: 8.6 mg/dL — ABNORMAL LOW (ref 8.9–10.3)
Chloride: 111 mmol/L (ref 101–111)
Creatinine, Ser: 0.81 mg/dL (ref 0.44–1.00)
GFR calc non Af Amer: >60 mL/min (ref >60)
GLUCOSE: 137 mg/dL — AB (ref 65–99)
POTASSIUM: 4.3 mmol/L (ref 3.5–5.1)
SODIUM: 143 mmol/L (ref 135–145)

## 2014-11-03 LAB — GLUCOSE, CAPILLARY
GLUCOSE-CAPILLARY: 189 mg/dL — AB (ref 65–99)
GLUCOSE-CAPILLARY: 151 mg/dL — AB (ref 65–99)
Glucose-Capillary: 87 mg/dL (ref 65–99)
Glucose-Capillary: 98 mg/dL (ref 65–99)
Glucose-Capillary: 206 mg/dL — ABNORMAL HIGH (ref 65–99)

## 2014-11-03 MED ORDER — OXYCODONE HCL 5 MG PO TABS
20.0000 mg | ORAL_TABLET | Freq: Four times a day (QID) | ORAL | Status: DC | PRN
  Administered 2014-11-03 – 2014-11-04 (×5): 20 mg via ORAL
  Filled 2014-11-03 (×5): qty 4

## 2014-11-03 NOTE — Care Management Important Message (Signed)
Important Message  Patient Details  Name: Crystal Thornton MRN: [id number] Date of Birth: February 09, 1946   Medicare Important Message Given:  Yes-second notification given    Delorse Lek 11/03/2014, 2:21 PM

## 2014-11-03 NOTE — Progress Notes (Addendum)
Speech Language Pathology Treatment: Dysphagia  Patient Details Name: IVONNA KINNICK MRN: [id number] DOB: 1945-04-18 Today's Date: 11/03/2014 Time: [id number] SLP Time Calculation (min) (ACUTE ONLY): 10 min  Assessment / Plan / Recommendation Clinical Impression  Skilled treatment session focused on addressing dysphagia goals.  SLP provided patient with regular textures and thin liquids.  Patient was able to set-up PO and consumed it Independently with effective mastication, oral clearance and no overt s/s of aspiration.  As a result, no further skilled SLP services are warranted a this time.  Education is complete and SLP is signing off.      HPI Other Pertinent Information: 69 y.o. female  With HTN, HLP, depression brought in by EMS for a seizure. The patient is unresponsive and history is given by the ER doctor. No family at bedside. Given 2.5 mg of Valium in route. Seizure was described as tonic clonic lasting for 10-15 min. She became hypotensive and had shallow breathing and had to be bagged at one point. She has since been unresponsive. She was given Narcan by the EMS and again in the ER without results.    Pertinent Vitals Pain Assessment: No/denies pain   SLP Plan  All goals met    Recommendations Diet recommendations: Regular;Thin liquid Liquids provided via: Cup;Straw Medication Administration: Whole meds with liquid Supervision: Patient able to self feed Compensations: Slow rate;Small sips/bites Postural Changes and/or Swallow Maneuvers: Seated upright 90 degrees              Oral Care Recommendations: Oral care BID Follow up Recommendations: None Plan: All goals met    GO    Carmelia Roller., CCC-SLP [phone]  Kellen Hover 11/03/2014, 11:11 AM
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
## 2014-11-03 NOTE — Progress Notes (Signed)
Physical Therapy Treatment Patient Details Name: Crystal Thornton MRN: [id number] DOB: Feb 04, 1946 Today's Date: 11/03/2014    History of Present Illness Crystal Thornton is a 69 y.o. female With HTN, HLP, depression brought in by EMS for a seizure    PT Comments    Pt physical therapy session today was limited due to L ankle pain. Pt ambulated 80 ft before C/O of pain. Pt still demo impulsiveness and requires max cues to complete task. Pt con't to have hallucinations of police officers in hallway. Attempted to call numbers in the chart however no answers. Pt provided PT with granddaughters phone number however it was incorrect. Unable to reach family/friends to determine patient's baseline level of cognition. Continue to recommend DC destination to maximize function independence.  Follow Up Recommendations  SNF;Supervision/Assistance - 24 hour     Equipment Recommendations  None recommended by PT    Recommendations for Other Services       Precautions / Restrictions Precautions Precautions: Fall Precaution Comments: hallucinations at times Restrictions Weight Bearing Restrictions: No    Mobility  Bed Mobility Overal bed mobility: Needs Assistance Bed Mobility: Supine to Sit     Supine to sit: Min assist;HOB elevated     General bed mobility comments: pt stitting in seat at start of session  Transfers Overall transfer level: Needs assistance Equipment used: Rolling walker (2 wheeled) Transfers: Sit to/from Stand Sit to Stand: Min assist         General transfer comment: vc for hand placement, pushing up from chair not RW, pt very quick to stand. min A  for safety  Ambulation/Gait Ambulation/Gait assistance: Min guard Ambulation Distance (Feet): 80 Feet (x2) Assistive device: Rolling walker (2 wheeled) Gait Pattern/deviations: Step-through pattern;Decreased stride length;Antalgic;Trunk flexed;Narrow base of support Gait velocity: decreased Gait velocity  interpretation: Below normal speed for age/gender General Gait Details: vc for standing up stright while ambulating, hand placement on RW, and to stay close to in close proximity to RW. Pt demo Limp on LLE secondary to pain. pt also squints her eye while ambulating    Stairs            Wheelchair Mobility    Modified Rankin (Stroke Patients Only)       Balance Overall balance assessment: Needs assistance Sitting-balance support: No upper extremity supported;Feet supported Sitting balance-Leahy Scale: Good     Standing balance support: Bilateral upper extremity supported;During functional activity Standing balance-Leahy Scale: Poor Standing balance comment: pt is able to static stand without rw however due to implusiveness pt is unsafe and requires a RW                    Cognition Arousal/Alertness: Awake/alert Behavior During Therapy: Impulsive Overall Cognitive Status: Impaired/Different from baseline Area of Impairment: Safety/judgement;Awareness;Following commands;Memory   Current Attention Level: Sustained Memory: Decreased short-term memory;Decreased recall of precautions Following Commands: Follows one step commands consistently;Follows multi-step commands inconsistently Safety/Judgement: Decreased awareness of safety;Decreased awareness of deficits Awareness: Emergent Problem Solving: Slow processing General Comments: pt impulsive and unaware of safety for self. pt removes hand from RW, decreased awareness of catheter    Exercises      General Comments General comments (skin integrity, edema, etc.): session limited due to L ankle pain      Pertinent Vitals/Pain Pain Assessment: No/denies pain Pain Score: 7  Faces Pain Scale: Hurts whole lot Pain Location: L ankle  Pain Intervention(s): Limited activity within patient's tolerance;Monitored during session    Home Living  Family/patient expects to be discharged to:: Private residence Living  Arrangements: Other (Comment);Alone ((pt reports 14yo grandaughter stays with her)) Available Help at Discharge: Available PRN/intermittently;Family;Friend(s);Other (Comment) (Pt reports she has a friend who could help at d/c.) Type of Home: Mobile home Home Access: Stairs to enter Entrance Stairs-Rails: None Home Layout: One level Home Equipment: Environmental consultant - 2 wheels;Shower seat      Prior Function Level of Independence: Independent          PT Goals (current goals can now be found in the care plan section) Acute Rehab PT Goals Patient Stated Goal: none stated during sesison Progress towards PT goals: Progressing toward goals    Frequency  Min 3X/week    PT Plan Current plan remains appropriate    Co-evaluation             End of Session Equipment Utilized During Treatment: Gait belt Activity Tolerance: Patient limited by fatigue Patient left: in chair;with call bell/phone within reach;with chair alarm set     Time: [phone] PT Time Calculation (min) (ACUTE ONLY): 17 min  Charges:  $Gait Training: 8-22 mins                    G Codes:      Renaee Munda November 11, 2014, 12:33 PM  Renaee Munda, [place] (student physical therapy assistant) Acute Rehab [phone]

## 2014-11-03 NOTE — Progress Notes (Signed)
[facility] PROGRESS NOTE  Crystal Thornton MPN:[id number] DOB: [birth year] DOA: 11/01/2014 PCP: Cathlean Cower, MD  Assessment/Plan: #1 seizures/acute encephalopathy Questionable etiology. Patient stated to neurologist and to me that she had been taking Xanax 1 mg 3 times daily if not more and had run out early one day prior to admission. Per neurology feel this might be secondary to Xanax withdrawal. CT head negative. MRI head negative. Magnesium was 1.9. Phosphorus at 5.0. Xanax has been resumed when necessary per neurology. Neurology recommends not to resume Chantix. EEG pending. Neurology following and appreciate input and recommendations.  #2 Delusions/Hallucinations Questionable etiology. CT had MRI head negative for any acute abnormalities. EEG pending. Urinalysis done was negative for UTI. Patient with no pulmonary symptoms. Doubt if infectious etiology. Patient has been on chronic long-acting narcotic medications and currently receiving a decreased dose of her home regimen. Per nursing patient feels conversations have been recorded in the telephone is being tapped. Will consult with psychiatry for further evaluation and management.  #3 hypertension Stable. Continue home dose atenolol and titrate as needed.   #4 acute renal failure Improved with hydration. Follow.  #5 depression Continue Xanax and Zoloft.   #6 diabetes mellitus Hemoglobin A1c on 08/12/2014 was 6.2. CBGs have ranged from 98-. 206 Continue sliding scale insulin.  #7 chronic pain syndrome Increase oxycodone 20 mg every 6 hours when necessary.   #8 prophylaxis Lovenox for DVT prophylaxis.  Code Status: Full Family Communication: Updated patient. No family present. Disposition Plan: Remain on telemetry. Pending EEG results and per neurology. Likely need skilled nursing facility.   Consultants:  Neurology: Dr. Doy Mince 11/01/2014  Procedures:  EEG 11/02/2014  CT head 11/01/2014  MRI brain with and  without contrast 11/01/2014  Antibiotics:  None  HPI/Subjective: Patient denies any chest pain. Patient denies any shortness of breath. No seizures overnight. Per nursing patient is delusional and hallucinating. Per nursing patient making claims that staff were recorded in her conversations and that her telephone was being tapped. Patient denies any suicidal ideation. Patient denies any homicidal ideation.  Objective: Filed Vitals:   11/03/14 1124  BP: 163/79  Pulse: 60  Temp: 99.2 F (37.3 C)  Resp: 18    Intake/Output Summary (Last 24 hours) at 11/03/14 1230 Last data filed at 11/03/14 0926  Gross per 24 hour  Intake      0 ml  Output   3750 ml  Net  -3750 ml   Filed Weights   11/01/14 0558  Weight: 62.596 kg (138 lb)    Exam:   General:  NAD  Cardiovascular: RRR  Respiratory: CTAB  Abdomen: soft, nontender, nondistended, positive bowel sounds.  Musculoskeletal: No clubbing cyanosis or edema.  Data Reviewed: Basic Metabolic Panel:  Recent Labs Lab 11/01/14 0108 11/01/14 0730 11/02/14 0757 11/03/14 0954  NA 140  --  143 143  K 5.0  --  3.9 4.3  CL 104  --  109 111  CO2 29  --  25 24  GLUCOSE 142*  --  103* 137*  BUN 26*  --  14 11  CREATININE 1.03* 0.81 0.74 0.81  CALCIUM 9.0  --  8.9 8.6*  MG  --  1.9  --   --   PHOS  --  5.0*  --   --    Liver Function Tests:  Recent Labs Lab 11/01/14 0108 11/02/14 0757  AST 18 21  ALT 14 13*  ALKPHOS 88 77  BILITOT 0.3 0.7  PROT 6.2* 5.8*  ALBUMIN 3.3* 3.1*   No results for input(s): LIPASE, AMYLASE in the last 168 hours. No results for input(s): AMMONIA in the last 168 hours. CBC:  Recent Labs Lab 11/01/14 0108 11/01/14 0730  WBC 7.6 9.5  NEUTROABS 5.4  --   HGB 13.3 12.3  HCT 39.6 37.9  MCV 94.1 94.5  PLT 220 222   Cardiac Enzymes: No results for input(s): CKTOTAL, CKMB, CKMBINDEX, TROPONINI in the last 168 hours. BNP (last 3 results) No results for input(s): BNP in the last 8760  hours.  ProBNP (last 3 results) No results for input(s): PROBNP in the last 8760 hours.  CBG:  Recent Labs Lab 11/02/14 2001 11/02/14 2350 11/03/14 0406 11/03/14 0755 11/03/14 1141  GLUCAP 162* 94 87 98 206*    No results found for this or any previous visit (from the past 240 hour(s)).   Studies: No results found.  Scheduled Meds: . aspirin EC  325 mg Oral Daily  . atenolol  50 mg Oral Daily  . enoxaparin (LOVENOX) injection  40 mg Subcutaneous Q24H  . insulin aspart  0-9 Units Subcutaneous TID WC  . nicotine  21 mg Transdermal Daily  . pravastatin  80 mg Oral q1800  . sertraline  200 mg Oral Daily   Continuous Infusions:    Principal Problem:   Seizure Active Problems:   Acute encephalopathy   Diabetes   Hyperlipidemia   Essential hypertension   ARF (acute renal failure)   Depression   Altered mental status   Chronic pain syndrome   Delusions   Hallucinations    Time spent: [place] MD [facility] Pager [phone]. If 7PM-7AM, please contact night-coverage at [email or web address], password [facility] 11/03/2014, 12:30 PM  LOS: 2 days

## 2014-11-03 NOTE — Progress Notes (Signed)
Occupational Therapy Evaluation Patient Details Name: Crystal Thornton MRN: [id number] DOB: 09/28/1945 Today's Date: 11/03/2014    History of Present Illness Crystal Thornton is a 69 y.o. female With HTN, HLP, depression brought in by EMS for a seizure   Clinical Impression   Pt admitted with the above diagnoses and presents with below problem list. Pt will benefit from continued acute OT to address the below listed deficits and maximize independence with BADLs prior to d/c to venue below. PTA pt reports she was independent with ADLs.Pt presents with decreased standing balance, generalized weakness, and impaired cognition impacting level of assist with ADLs. Pt is currently min A for LB ADLs, transfers, and functional mobility. Pt lives with "42/80 year old grandaughter." Pt reports she has a friend who may be able to assist at d/c but unclear if 24/7 assist can be arranged therefore recommending SNF at this time. OT to continue to follow acutely.       Follow Up Recommendations  SNF;Supervision/Assistance - 24 hour    Equipment Recommendations  Other (comment) (TBD next venue)    Recommendations for Other Services       Precautions / Restrictions Precautions Precautions: Fall Precaution Comments: hallucinations at times Restrictions Weight Bearing Restrictions: No      Mobility Bed Mobility Overal bed mobility: Needs Assistance Bed Mobility: Supine to Sit     Supine to sit: Min assist;HOB elevated     General bed mobility comments: HOB elevated and UE assist from therapist to boost to EOB.   Transfers Overall transfer level: Needs assistance Equipment used: Rolling walker (2 wheeled) Transfers: Sit to/from Stand Sit to Stand: Min assist         General transfer comment: Min A to steady and due to equipment management second to impulsivity.    Balance Overall balance assessment: Needs assistance         Standing balance support: Bilateral upper extremity  supported;During functional activity Standing balance-Leahy Scale: Poor Standing balance comment: rw for balance                            ADL Overall ADL's : Needs assistance/impaired Eating/Feeding: Set up;Sitting   Grooming: Min guard;Oral care;Standing   Upper Body Bathing: Set up;Sitting   Lower Body Bathing: Minimal assistance;Sit to/from stand   Upper Body Dressing : Min guard;Sitting   Lower Body Dressing: Minimal assistance;Sit to/from stand   Toilet Transfer: Minimal assistance;Ambulation;Comfort height toilet;Grab bars;RW   Toileting- Clothing Manipulation and Hygiene: Min guard;Sitting/lateral lean       Functional mobility during ADLs: Minimal assistance;Rolling walker General ADL Comments: Pt presents with decreased standing balance, generalized weakness, and impaired cognition impacting level of assist with ADLs. Pt completed bed mobility as detailed below. Pt ambulated to bathroom and completed toilet transfer, pericare and grooming as detailed above. Pt needed assist at times to keep rw in front of her and for awareness of lines.      Vision     Perception     Praxis      Pertinent Vitals/Pain Pain Assessment: Faces Pain Score: 7  Faces Pain Scale: Hurts whole lot Pain Location: L ankle  Pain Intervention(s): Limited activity within patient's tolerance;Monitored during session     Hand Dominance Right   Extremity/Trunk Assessment Upper Extremity Assessment Upper Extremity Assessment: Generalized weakness   Lower Extremity Assessment Lower Extremity Assessment: Defer to PT evaluation       Communication Communication Communication:  No difficulties   Cognition Arousal/Alertness: Awake/alert Behavior During Therapy: Impulsive Overall Cognitive Status: Impaired/Different from baseline Area of Impairment: Safety/judgement;Awareness;Following commands   Current Attention Level: Sustained Memory: Decreased recall of  precautions;Decreased short-term memory   Safety/Judgement: Decreased awareness of safety;Decreased awareness of deficits   Problem Solving: Slow processing General Comments: Assistance at times due to impulsivity and decreased regard for foley and IV lines.   General Comments       Exercises       Shoulder Instructions      Home Living Family/patient expects to be discharged to:: Private residence Living Arrangements: Other (Comment);Alone ((pt reports 14yo grandaughter stays with her)) Available Help at Discharge: Available PRN/intermittently;Family;Friend(s);Other (Comment) (Pt reports she has a friend who could help at d/c.) Type of Home: Mobile home Home Access: Stairs to enter Entrance Stairs-Number of Steps: 4 Entrance Stairs-Rails: None Home Layout: One level     Bathroom Shower/Tub: Tub/shower unit;Walk-in shower         Home Equipment: Gilford Rile - 2 wheels;Shower seat          Prior Functioning/Environment Level of Independence: Independent             OT Diagnosis: Generalized weakness;Cognitive deficits;Other (comment) (Impaired standing balance)   OT Problem List: Decreased strength;Impaired balance (sitting and/or standing);Decreased cognition;Decreased safety awareness;Decreased knowledge of use of DME or AE;Decreased knowledge of precautions   OT Treatment/Interventions: Self-care/ADL training;Therapeutic exercise;DME and/or AE instruction;Therapeutic activities;Energy conservation;Cognitive remediation/compensation;Patient/family education;Balance training    OT Goals(Current goals can be found in the care plan section) Acute Rehab OT Goals Patient Stated Goal: none stated during sesison OT Goal Formulation: With patient Time For Goal Achievement: 11/10/14 Potential to Achieve Goals: Good ADL Goals Pt Will Perform Grooming: with modified independence;standing Pt Will Perform Lower Body Bathing: with modified independence;with adaptive  equipment;sit to/from stand Pt Will Perform Lower Body Dressing: with modified independence;with adaptive equipment;sit to/from stand Pt Will Transfer to Toilet: with modified independence;ambulating (3n1 over toilet) Pt Will Perform Toileting - Clothing Manipulation and hygiene: with modified independence;sit to/from stand Pt/caregiver will Perform Home Exercise Program: Increased strength;Both right and left upper extremity;With written HEP provided  OT Frequency: Min 2X/week   Barriers to D/C: Decreased caregiver support  Pt reports "60/33 year old" granddaughter lives with her.       Co-evaluation              End of Session Equipment Utilized During Treatment: Gait belt;Rolling walker Nurse Communication: Patient requests pain meds  Activity Tolerance: Patient tolerated treatment well Patient left: in chair;with call bell/phone within reach;with chair alarm set;Other (comment) (with PT.)   Time: 0922-0940 OT Time Calculation (min): 18 min Charges:  OT General Charges $OT Visit: 1 Procedure OT Evaluation $Initial OT Evaluation Tier I: 1 Procedure G-Codes:    Hortencia Pilar November 10, 2014, 10:08 AM

## 2014-11-04 DIAGNOSIS — F419 Anxiety disorder, unspecified: Secondary | ICD-10-CM | POA: Diagnosis not present

## 2014-11-04 DIAGNOSIS — R569 Unspecified convulsions: Secondary | ICD-10-CM | POA: Diagnosis not present

## 2014-11-04 DIAGNOSIS — G40909 Epilepsy, unspecified, not intractable, without status epilepticus: Secondary | ICD-10-CM | POA: Diagnosis not present

## 2014-11-04 DIAGNOSIS — E785 Hyperlipidemia, unspecified: Secondary | ICD-10-CM | POA: Diagnosis not present

## 2014-11-04 DIAGNOSIS — I9589 Other hypotension: Secondary | ICD-10-CM | POA: Diagnosis not present

## 2014-11-04 DIAGNOSIS — G92 Toxic encephalopathy: Secondary | ICD-10-CM | POA: Diagnosis not present

## 2014-11-04 DIAGNOSIS — R4182 Altered mental status, unspecified: Secondary | ICD-10-CM | POA: Diagnosis not present

## 2014-11-04 DIAGNOSIS — E119 Type 2 diabetes mellitus without complications: Secondary | ICD-10-CM | POA: Insufficient documentation

## 2014-11-04 DIAGNOSIS — M25572 Pain in left ankle and joints of left foot: Secondary | ICD-10-CM | POA: Diagnosis not present

## 2014-11-04 DIAGNOSIS — I10 Essential (primary) hypertension: Secondary | ICD-10-CM | POA: Diagnosis not present

## 2014-11-04 DIAGNOSIS — G9340 Encephalopathy, unspecified: Secondary | ICD-10-CM | POA: Diagnosis not present

## 2014-11-04 DIAGNOSIS — F22 Delusional disorders: Secondary | ICD-10-CM | POA: Diagnosis not present

## 2014-11-04 DIAGNOSIS — E089 Diabetes mellitus due to underlying condition without complications: Secondary | ICD-10-CM | POA: Diagnosis not present

## 2014-11-04 DIAGNOSIS — G894 Chronic pain syndrome: Secondary | ICD-10-CM | POA: Diagnosis not present

## 2014-11-04 DIAGNOSIS — G40309 Generalized idiopathic epilepsy and epileptic syndromes, not intractable, without status epilepticus: Secondary | ICD-10-CM | POA: Diagnosis not present

## 2014-11-04 DIAGNOSIS — F329 Major depressive disorder, single episode, unspecified: Secondary | ICD-10-CM | POA: Diagnosis not present

## 2014-11-04 DIAGNOSIS — F17200 Nicotine dependence, unspecified, uncomplicated: Secondary | ICD-10-CM | POA: Diagnosis not present

## 2014-11-04 DIAGNOSIS — N179 Acute kidney failure, unspecified: Secondary | ICD-10-CM | POA: Diagnosis not present

## 2014-11-04 DIAGNOSIS — D229 Melanocytic nevi, unspecified: Secondary | ICD-10-CM | POA: Diagnosis not present

## 2014-11-04 LAB — GLUCOSE, CAPILLARY
GLUCOSE-CAPILLARY: 114 mg/dL — AB (ref 65–99)
GLUCOSE-CAPILLARY: 128 mg/dL — AB (ref 65–99)
Glucose-Capillary: 118 mg/dL — ABNORMAL HIGH (ref 65–99)

## 2014-11-04 MED ORDER — NICOTINE 21 MG/24HR TD PT24: 21.0000 mg | MEDICATED_PATCH | Freq: Every day | TRANSDERMAL | Status: DC

## 2014-11-04 MED ORDER — ALPRAZOLAM 1 MG PO TABS: ORAL_TABLET | ORAL | Status: DC

## 2014-11-04 MED ORDER — OXYCODONE HCL 30 MG PO TABS: ORAL_TABLET | ORAL | Status: DC

## 2014-11-04 MED ORDER — SERTRALINE HCL 100 MG PO TABS: 200.0000 mg | ORAL_TABLET | Freq: Every day | ORAL | Status: DC

## 2014-11-04 NOTE — Clinical Social Work Note (Signed)
Clinical Social Work Assessment  Patient Details  Name: Crystal Thornton MRN: [id number] Date of Birth: [birth year]  Date of referral:  11/03/14               Reason for consult:  Facility Placement              Housing/Transportation Living arrangements for the past 2 months:  Apartment Source of Information:  Adult Children Patient Interpreter Needed:  None Criminal Activity/Legal Involvement Pertinent to Current Situation/Hospitalization:  No - Comment as needed Significant Relationships:  Adult Children Lives with:  Self Do you feel safe going back to the place where you live?  No Need for family participation in patient care:  Yes (Comment)  Care giving concerns:  Crystal Thornton reported concerns with the pt's mental status.   Social Worker assessment / plan: CSW spoke with the pt's son Crystal Thornton at [phone]. CSW introduced self and purpose of the call. CSW discussed SNF rehab. CSW explained the SNF process. CSW explained insurance and its relation to SNF placement. CSW provided emailed Crystal Thornton a SNF list. CSW and Crystal Thornton discussed geographic location in which the he would like the pt to receive rehab. Crystal Thornton reported that she will like to remain in [facility]. CSW answered all questions in which the Crystal Thornton inquired about. CSW will continue to follow this pt and assist with discharge as needed.   Employment status:  Unemployed Nurse, adult PT Recommendations:  [place] / Referral to community resources:  [place]  Patient/Family's Response to care:  Crystal Thornton reported the care in which the pt has received has been good.   Patient/Family's Understanding of and Emotional Response to Diagnosis, Current Treatment, and Prognosis:  Crystal Thornton expressed concerns with the pt's mental health. Crystal Thornton reported that the pt has had seizures before, but this episode is different. Crystal Thornton requested to speak with the MD. CSW paged  MD.   Emotional Assessment Appearance:  Appears stated age Attitude/Demeanor/Rapport:  Unable to Assess Affect (typically observed):  Unable to Assess Orientation:  Oriented to Self Alcohol / Substance use:  Not Applicable Psych involvement (Current and /or in the community):  No (Comment)  Discharge Needs  Concerns to be addressed:  Denies Needs/Concerns at this time Readmission within the last 30 days:  No Current discharge risk:  None Barriers to Discharge:  No Barriers Identified   Cathe Bilger, LCSW 11/04/2014, 7:56 AM

## 2014-11-04 NOTE — Clinical Social Work Placement (Signed)
   CLINICAL SOCIAL WORK PLACEMENT  NOTE  Date:  11/04/2014  Patient Details  Name: Crystal Thornton MRN: [id number] Date of Birth: [birth year]  Clinical Social Work is seeking post-discharge placement for this patient at the [place] level of care (*CSW will initial, date and re-position this form in  chart as items are completed):  Yes   Patient/family provided with [place] Work Department's list of facilities offering this level of care within the geographic area requested by the patient (or if unable, by the patient's family).  Yes   Patient/family informed of their freedom to choose among providers that offer the needed level of care, that participate in Medicare, Medicaid or managed care program needed by the patient, have an available bed and are willing to accept the patient.  Yes   Patient/family informed of [facility]'s ownership interest in [facility] and [facility], as well as of the fact that they are under no obligation to receive care at these facilities.  PASRR submitted to EDS on 11/03/14     PASRR number received on 11/03/14     Existing PASRR number confirmed on       FL2 transmitted to all facilities in geographic area requested by pt/family on 11/04/14     FL2 transmitted to all facilities within larger geographic area on       Patient informed that his/her managed care company has contracts with or will negotiate with certain facilities, including the following:        Yes   Patient/family informed of bed offers received.  Patient chooses bed at [place], [place]     Physician recommends and patient chooses bed at      Patient to be transferred to [place], [place] on 11/04/14.  Patient to be transferred to facility by [facility]      Patient family notified on 11/04/14 of transfer.  Name of family member notified:  left a voice messgae for Rodman Key [phone]     PHYSICIAN       Additional Comment:     _______________________________________________ Greta Doom, LCSW 11/04/2014, 2:41 PM

## 2014-11-04 NOTE — Progress Notes (Signed)
Subjective:  No further seizures.  Calm.  Again states a family member was in the hospital last week which increased her anxiety and she took extra Xanax and then ran out.    Objective: Current vital signs: BP 159/80 mmHg  Pulse 66  Temp(Src) 98 F (36.7 C) (Oral)  Resp 18  Wt 62.596 kg (138 lb)  SpO2 95% Vital signs in last 24 hours: Temp:  [97.9 F (36.6 C)-99.2 F (37.3 C)] 98 F (36.7 C) (08/10 0546) Pulse Rate:  [60-78] 66 (08/10 0546) Resp:  [18-20] 18 (08/10 0546) BP: (146-163)/(78-101) 159/80 mmHg (08/10 0546) SpO2:  [94 %-97 %] 95 % (08/10 0546)  Intake/Output from previous day: 08/09 0701 - 08/10 0700 In: -  Out: 850 [Urine:850] Intake/Output this shift:   Nutritional status: Diet regular Room service appropriate?: Yes; Fluid consistency:: Thin  Neurologic Exam: Mental Status: Alert, oriented, thought content appropriate. Speech fluent without evidence of aphasia. Able to follow 3 step commands without difficulty. Cranial Nerves: II: Visual fields grossly normal, pupils equal, round, reactive to light and accommodation III,IV, VI: ptosis not present, extra-ocular motions intact bilaterally V,VII: smile symmetric, facial light touch sensation normal bilaterally VIII: hearing normal bilaterally IX,X: uvula rises symmetrically XI: bilateral shoulder shrug XII: midline tongue extension without atrophy or fasciculations  Motor: Moving all extremities antigravity with 5/5 strength Sensory: Pinprick and light touch intact throughout, bilaterally Deep Tendon Reflexes:  Depressed 1+ throughout  Plantars: Right: downgoingLeft: downgoing Cerebellar: normal finger-to-nose, normal heel-to-shin test  Lab Results: Basic Metabolic Panel:  Recent Labs Lab 11/01/14 0108 11/01/14 0730 11/02/14 0757 11/03/14 0954  NA 140  --  143 143  K 5.0  --  3.9 4.3  CL 104  --  109 111  CO2 29  --  25 24  GLUCOSE 142*  --  103* 137*   BUN 26*  --  14 11  CREATININE 1.03* 0.81 0.74 0.81  CALCIUM 9.0  --  8.9 8.6*  MG  --  1.9  --   --   PHOS  --  5.0*  --   --     Liver Function Tests:  Recent Labs Lab 11/01/14 0108 11/02/14 0757  AST 18 21  ALT 14 13*  ALKPHOS 88 77  BILITOT 0.3 0.7  PROT 6.2* 5.8*  ALBUMIN 3.3* 3.1*   No results for input(s): LIPASE, AMYLASE in the last 168 hours. No results for input(s): AMMONIA in the last 168 hours.  CBC:  Recent Labs Lab 11/01/14 0108 11/01/14 0730  WBC 7.6 9.5  NEUTROABS 5.4  --   HGB 13.3 12.3  HCT 39.6 37.9  MCV 94.1 94.5  PLT 220 222    Cardiac Enzymes: No results for input(s): CKTOTAL, CKMB, CKMBINDEX, TROPONINI in the last 168 hours.  Lipid Panel: No results for input(s): CHOL, TRIG, HDL, CHOLHDL, VLDL, LDLCALC in the last 168 hours.  CBG:  Recent Labs Lab 11/03/14 1716 11/03/14 2006 11/04/14 0007 11/04/14 0416 11/04/14 0824  GLUCAP 189* 151* 118* 114* 128*    Microbiology: Results for orders placed or performed during the hospital encounter of 02/11/10  Surgical pcr screen     Status: None   Collection Time: 02/11/10  1:17 PM  Result Value Ref Range Status   MRSA, PCR NEGATIVE NEGATIVE Final   Staphylococcus aureus  NEGATIVE Final    NEGATIVE        The Xpert SA Assay (FDA approved for NASAL specimens only), is one component of a comprehensive surveillance  program.  It is not intended to diagnose infection nor to guide or monitor treatment.    Coagulation Studies: No results for input(s): LABPROT, INR in the last 72 hours.  Imaging: No results found.  Medications:  Scheduled: . aspirin EC  325 mg Oral Daily  . atenolol  50 mg Oral Daily  . enoxaparin (LOVENOX) injection  40 mg Subcutaneous Q24H  . insulin aspart  0-9 Units Subcutaneous TID WC  . nicotine  21 mg Transdermal Daily  . pravastatin  80 mg Oral q1800  . sertraline  200 mg Oral Daily    Assessment/Plan: No further seizures. Xanax has been restarted  while in hospital. EEG has been done but not read. MRI, Magnesium and phosphorus unrevealing.   Recommend: 1) would not recommend AED unless EEG indicates.  If EEG is negative for epileptic activity, will sign off as this likely is a provoked seizure secondary to Xanax withdrawal.    Etta Quill PA-C [facility] [phone]  11/04/2014, 9:38 AM  I personally participated in this patient's evaluation and management, including formulating the above clinical impression and management recommendations.  Rush Farmer M.D. [facility] [phone]

## 2014-11-04 NOTE — Clinical Social Work Note (Addendum)
CSW spoke with the pt's son Rodman Key [phone]. CSW provided Rodman Key with beds offers. Rodman Key stated that he would prefer [facility]. CSW informed Rodman Key that [facility] did not offer a bed. Rodman Key reported that he will call [facility] directly. Rodman Key reported reported that if the pt can not get into [facility] that he will take [facility] or [facility].   CSW left a voice message for Nira Conn at [facility] to inform her that the family is interested in coming to her agency.   Addendum: Pt has bed at [facility].   FL2 on chart please sign.   [place], MSW, [place]

## 2014-11-04 NOTE — Discharge Summary (Signed)
Physician Discharge Summary  Crystal Thornton:[id number] DOB: 11-Jan-1946 DOA: 11/01/2014  PCP: Cathlean Cower, MD  Admit date: 11/01/2014 Discharge date: 11/04/2014  Time spent: 65 minutes  Recommendations for Outpatient Follow-up:  1. Patient will be discharged to [facility]. Patient will follow-up with M.D. at the skilled nursing facility. It was recommended per psychiatry that might consider changing patient from short acting benzos to long-acting benzos as needed.  Discharge Diagnoses:  Principal Problem:   Seizure Active Problems:   Acute encephalopathy   Diabetes   Hyperlipidemia   Essential hypertension   ARF (acute renal failure)   Depression   Altered mental status   Chronic pain syndrome   Delusions   Hallucinations   Type 2 diabetes mellitus without complication   Discharge Condition: Stable and improved  Diet recommendation: Carb modified  Filed Weights   11/01/14 0558  Weight: 62.596 kg (138 lb)    History of present illness:  Crystal Thornton is a 69 y.o. female With HTN, HLP, depression brought in by EMS for a seizure. The patient is unresponsive and history is given by the ER doctor. No family at bedside. Given 2.5 mg of Valium in route. Seizure was described as tonic clonic lasting for 10-15 min. She became hypotensive and had shallow breathing and had to be bagged at one point. She has since been unresponsive. She was given Narcan by the EMS and again in the ER without results. She has been in the ER for 4 hrs and is still not awakening.    Hospital Course:  #1 seizures/acute encephalopathy secondary to Xanax withdrawal Questionable etiology. Patient stated to neurologist and to me that she had been taking Xanax 1 mg 3 times daily and due to family member being in the hospital last week which increased anxiety she took extra Xanax and subsequently ran out of it prior to admission with seizures.  Patient was admitted placed on telemetry neurology  consultation was obtained and followed the patient throughout the hospitalization. Patient was placed back on a home regimen of Xanax 1 mg 3 times daily as needed.CT head negative. MRI head negative. Magnesium was 1.9. Phosphorus at 5.0. Patient was felt to have withdrawal seizures as she had run out of Xanax. EEG which was done was negative for any epileptiform activity. Patient's Xanax was discontinued and was recommended not to resume it. Patient did not have any further episodes during the hospitalization and patient be discharged in stable and improved condition. It was recommended per psychiatry that patient may be changed to long-acting benzodiazepine is from short acting benzodiazepines.  #2 Delusions/Hallucinations Questionable etiology. CT had MRI head negative for any acute abnormalities. EEG was negative for any epileptiform activity. Urinalysis done was negative for UTI. Patient with no pulmonary symptoms. Doubt if infectious etiology. Patient has been on chronic long-acting narcotic medications and initially was placed on a decreased dose of oxycodone when necessary per her home regimen. Patient be discharged back to on her home regimen of oxycodone 30 mg 5 times daily as needed as noted on her PCPs office note.  During the hospitalization nursing did state that patient felt her telephone calls were being tapped and a conversations were been recorded. It was felt as if patient was having delusions and hallucinations. Psychiatric consultation was obtained and patient was seen in consultation by Dr. Louretta Shorten who felt patient was no longer delusional nor did she have any hallucinations. Patient was not suicidal. It was felt that patient had capacity  to make a wound decisions. It was felt patient did not need any further inpatient psychiatric evaluation and patient is to follow-up with PCP as outpatient. Patient was cleared by psychiatry to be discharged in stable and improved condition. Spoke with  psychiatrist Dr. Louretta Shorten prior to discharge as his note was pending.  #3 hypertension Stable. Continued on home dose atenolol.   #4 acute renal failure Resolved with hydration.   #5 depression Continued on home regimen of Xanax and Zoloft.   #6 diabetes mellitus Hemoglobin A1c on 08/12/2014 was 6.2. Patient was maintained on a sliding scale insulin during the hospitalization.  #7 chronic pain syndrome Patient was maintained on oxycodone for her chronic pain.  Procedures:  EEG 11/02/2014  CT head 11/01/2014  MRI brain with and without contrast 11/01/2014    Consultations:  Neurology: Dr. Doy Mince 11/01/2014  Psychiatry: Dr. Louretta Shorten 11/04/2014  Discharge Exam: Filed Vitals:   11/04/14 1427  BP: 163/77  Pulse: 61  Temp: 98.6 F (37 C)  Resp: 16    General: NAD Cardiovascular: RRR Respiratory: CTAB  Discharge Instructions   Discharge Instructions    Diet Carb Modified    Complete by:  As directed      Discharge instructions    Complete by:  As directed   Follow up with MD at SNF     Increase activity slowly    Complete by:  As directed           Current Discharge Medication List    START taking these medications   Details  nicotine (NICODERM CQ - DOSED IN MG/24 HOURS) 21 mg/24hr patch Place 1 patch (21 mg total) onto the skin daily. Qty: 28 patch, Refills: 0      CONTINUE these medications which have CHANGED   Details  ALPRAZolam (XANAX) 1 MG tablet TAKE 1 TABLET BY MOUTH THREE TIMES DAILYAS NEEDED for anxiety. Qty: 20 tablet, Refills: 0    oxycodone (ROXICODONE) 30 MG immediate release tablet 30 mg 5 times daily as needed for pain. Qty: 20 tablet, Refills: 0    sertraline (ZOLOFT) 100 MG tablet Take 2 tablets (200 mg total) by mouth daily. Qty: 30 tablet, Refills: 0      CONTINUE these medications which have NOT CHANGED   Details  aspirin EC 325 MG tablet Take 325 mg by mouth daily.    atenolol (TENORMIN) 50 MG tablet Take 1  tablet (50 mg total) by mouth daily. Qty: 90 tablet, Refills: 3    B Complex-C (B-COMPLEX WITH VITAMIN C) tablet Take 1 tablet by mouth daily.    cyclobenzaprine (FLEXERIL) 10 MG tablet Take 10 mg by mouth 3 (three) times daily.     GINKGO BILOBA COMPLEX PO Take 1 tablet by mouth daily.    Ginseng 500 MG CAPS Take 1 capsule by mouth daily.    metFORMIN (GLUCOPHAGE) 500 MG tablet Take 2 tablets (1,000 mg total) by mouth daily with breakfast. Qty: 180 tablet, Refills: 3    Multiple Vitamins-Minerals (CENTRUM SILVER PO) Take 1 tablet by mouth daily.      pravastatin (PRAVACHOL) 40 MG tablet Take 2 tablets (80 mg total) by mouth daily. Qty: 90 tablet, Refills: 3      STOP taking these medications     amoxicillin-clavulanate (AUGMENTIN) 875-125 MG per tablet      tiZANidine (ZANAFLEX) 2 MG tablet      varenicline (CHANTIX CONTINUING MONTH PAK) 1 MG tablet      varenicline (CHANTIX STARTING MONTH PAK) 0.5  MG X 11 & 1 MG X 42 tablet        Allergies  Allergen Reactions  . Naproxen Sodium Anaphylaxis  . Sulfonamide Derivatives Other (See Comments)    Causes nephritis  . Ace Inhibitors Swelling    Lip swelling   Follow-up Information    Please follow up.   Why:  f/u with MD at SNF       The results of significant diagnostics from this hospitalization (including imaging, microbiology, ancillary and laboratory) are listed below for reference.    Significant Diagnostic Studies: Ct Head Wo Contrast  11/01/2014   CLINICAL DATA:  Seizure this morning. History of epilepsy, diabetes, hypertension, hepatitis.  EXAM: CT HEAD WITHOUT CONTRAST  TECHNIQUE: Contiguous axial images were obtained from the base of the skull through the vertex without intravenous contrast.  COMPARISON:  CT head Aug 15, 2003  FINDINGS: The ventricles and sulci are normal for age. No intraparenchymal hemorrhage, mass effect nor midline shift. Patchy supratentorial white matter hypodensities are within normal  range for patient's age and though non-specific suggest sequelae of chronic small vessel ischemic disease. No acute large vascular territory infarcts.  No abnormal extra-axial fluid collections. Basal cisterns are patent. Moderate calcific atherosclerosis of the carotid siphons.  No skull fracture. New subcentimeter sclerotic focus in RIGHT clivus/P trace apex, axial 12/81. The included ocular globes and orbital contents are non-suspicious. The mastoid aircells and included paranasal sinuses are well-aerated.  IMPRESSION: No acute intracranial process.  Involutional changes.  Mild chronic small vessel ischemic disease.  New 1 cm sclerotic lesion RIGHT skullbase, there is a history of cancer, consider bone scan. Alternatively, this could be further characterized with MRI with contrast, and fat saturated sequences on a nonemergent basis.   Electronically Signed   By: Elon Alas M.D.   On: 11/01/2014 04:06   Mr Jeri Cos VQ Contrast  11/01/2014   CLINICAL DATA:  History of seizures, with recent exacerbation. History of diabetes, hypertension, and hepatitis. Urine drug screen positive for opiates and benzodiazepines.  EXAM: MRI HEAD WITHOUT AND WITH CONTRAST  TECHNIQUE: Multiplanar, multiecho pulse sequences of the brain and surrounding structures were obtained without and with intravenous contrast.  CONTRAST:  MultiHance 13 mL.  COMPARISON:  CT head earlier today.  FINDINGS: No evidence for acute infarction, hemorrhage, mass lesion, hydrocephalus, or extra-axial fluid. Normal for age cerebral volume. There is moderately advanced T2 and FLAIR hyperintensity in the mid pons, with only minor periventricular greater than subcortical signal abnormality in the supratentorial compartment. This is presumed to represent chronic microvascular ischemic change in this patient with hypertension and diabetes. Flow voids are maintained throughout the carotid, basilar, and vertebral arteries. There are no areas of chronic  hemorrhage. Pituitary, pineal, and cerebellar tonsils unremarkable. No upper cervical lesions.  Thin-section high-resolution T2 weighted images through the temporal lobes show no significant asymmetry or mass.  Post infusion, no abnormal enhancement of the brain. There is mild symmetric dural enhancement over the convexity, tentorium, and falx. Differential is wide, but it is unclear if this truly represents a pathologic state. Symmetric dural enhancement can be seen in infection, intracranial hypotension, cerebral venous thrombosis, neurosarcoid, or idiopathic, among others. There are no signs that the patient has had previous cranial surgery. No sinus air-fluid level is seen. Continued surveillance warranted. Lumbar puncture could be performed if concern for infection.  Unremarkable mastoids.  Negative orbits.  No osseous findings.  IMPRESSION: No restricted diffusion to suggest acute stroke for postictal phenomenon. No  intracranial mass lesion or hemorrhage.  Normal for age cerebral volume with moderately prominent presumed chronic microvascular ischemic change in the brainstem, likely secondary to hypertension and/or diabetes.  Mild symmetric dural enhancement over the convexity, tentorium, and falx of uncertain significance. See discussion above.   Electronically Signed   By: Staci Righter M.D.   On: 11/01/2014 11:04    Microbiology: No results found for this or any previous visit (from the past 240 hour(s)).   Labs: Basic Metabolic Panel:  Recent Labs Lab 11/01/14 0108 11/01/14 0730 11/02/14 0757 11/03/14 0954  NA 140  --  143 143  K 5.0  --  3.9 4.3  CL 104  --  109 111  CO2 29  --  25 24  GLUCOSE 142*  --  103* 137*  BUN 26*  --  14 11  CREATININE 1.03* 0.81 0.74 0.81  CALCIUM 9.0  --  8.9 8.6*  MG  --  1.9  --   --   PHOS  --  5.0*  --   --    Liver Function Tests:  Recent Labs Lab 11/01/14 0108 11/02/14 0757  AST 18 21  ALT 14 13*  ALKPHOS 88 77  BILITOT 0.3 0.7  PROT  6.2* 5.8*  ALBUMIN 3.3* 3.1*   No results for input(s): LIPASE, AMYLASE in the last 168 hours. No results for input(s): AMMONIA in the last 168 hours. CBC:  Recent Labs Lab 11/01/14 0108 11/01/14 0730  WBC 7.6 9.5  NEUTROABS 5.4  --   HGB 13.3 12.3  HCT 39.6 37.9  MCV 94.1 94.5  PLT 220 222   Cardiac Enzymes: No results for input(s): CKTOTAL, CKMB, CKMBINDEX, TROPONINI in the last 168 hours. BNP: BNP (last 3 results) No results for input(s): BNP in the last 8760 hours.  ProBNP (last 3 results) No results for input(s): PROBNP in the last 8760 hours.  CBG:  Recent Labs Lab 11/03/14 1716 11/03/14 2006 11/04/14 0007 11/04/14 0416 11/04/14 0824  GLUCAP 189* 151* 118* 114* 128*       Signed:  THOMPSON,DANIEL MD [facility] 11/04/2014, 2:35 PM

## 2014-11-04 NOTE — Clinical Social Work Placement (Signed)
   CLINICAL SOCIAL WORK PLACEMENT  NOTE  Date:  11/04/2014  Patient Details  Name: Crystal Thornton MRN: [id number] Date of Birth: 03-Aug-1945  Clinical Social Work is seeking post-discharge placement for this patient at the [facility] level of care (*CSW will initial, date and re-position this form in  chart as items are completed):  Yes   Patient/family provided with [place] Work Department's list of facilities offering this level of care within the geographic area requested by the patient (or if unable, by the patient's family).  Yes   Patient/family informed of their freedom to choose among providers that offer the needed level of care, that participate in Medicare, Medicaid or managed care program needed by the patient, have an available bed and are willing to accept the patient.  Yes   Patient/family informed of [facility]'s ownership interest in [facility] and [facility], as well as of the fact that they are under no obligation to receive care at these facilities.  PASRR submitted to EDS on 11/03/14     PASRR number received on 11/03/14     Existing PASRR number confirmed on       FL2 transmitted to all facilities in geographic area requested by pt/family on 11/04/14     FL2 transmitted to all facilities within larger geographic area on       Patient informed that his/her managed care company has contracts with or will negotiate with certain facilities, including the following:            Patient/family informed of bed offers received.  Patient chooses bed at       Physician recommends and patient chooses bed at      Patient to be transferred to   on  .  Patient to be transferred to facility by       Patient family notified on   of transfer.  Name of family member notified:        PHYSICIAN       Additional Comment:    _______________________________________________ Greta Doom, LCSW 11/04/2014, 8:03 AM

## 2014-11-04 NOTE — Procedures (Signed)
EEG report.  Brief clinical history: 69 year old female with a history of seizures presenting after a prolonged witnessed seizure at home.Patietn admits to taking Xanax 1 mg TID if not more and had run out early. Seizure likely provoked secondary to Xanax withdrawal. MRI, Magnesium and phosphorus unrevealing.  Technique: this is a 17 channel routine scalp EEG performed at the bedside with bipolar and monopolar montages arranged in accordance to the international 10/20 system of electrode placement. One channel was dedicated to EKG recording.  No drowsiness or sleep was recorded. No activating procedures performed.  Description: patient remains confused and agitated throughout the study, with a best background of 7.5 Hz and the majority of the recording revealing generalized and nearly continuous 7 to 8 Hz activity with occasional bursts of generalized high amplitude theta-delta activity. No electrographic seizures noted. No focal or generalized epileptiform discharges noted.  EKG showed sinus rhythm.  Impression: this is an abnormal awake EEG with findings consistent with global cerebral dysfunction, non specific. Please, be aware that the absence of epileptiform discharges do not exclude the possibility of epilepsy.  Clinical correlation is advised.   Dorian Pod, MD

## 2014-11-04 NOTE — Consult Note (Addendum)
[facility] Consult   Reason for Consult:  Paranoid delusions and s/p xanax withdrawal seizure Referring Physician:  Dr. Grandville Silos Patient Identification: Crystal Thornton MRN:  [id number] Principal Diagnosis: Seizure Diagnosis:   Patient Active Problem List   Diagnosis Date Noted  . Delusions [F22] 11/03/2014  . Hallucinations [R44.3] 11/03/2014  . Acute encephalopathy [G93.40] 11/02/2014  . Chronic pain syndrome [G89.4]   . ARF (acute renal failure) [N17.9] 11/01/2014  . Depression [F32.9] 11/01/2014  . Seizure [R56.9] 11/01/2014  . Altered mental status [R41.82]   . Smoker [Z72.0] 08/12/2014  . Preventative health care [Z00.00] 11/02/2010  . SKIN LESION [L98.9] 06/02/2010  . SHOULDER PAIN, RIGHT, CHRONIC [M25.519] 06/02/2010  . HEAD TRAUMA, CLOSED [S09.90XA] 01/24/2010  . ANGIOEDEMA [T78.3XXA] 01/24/2010  . PULMONARY NODULE, LEFT UPPER LOBE [J98.4] 05/31/2009  . Other specified sites of sprains and strains [T14.8] 08/31/2008  . Cough [R05] 05/29/2008  . BACK PAIN [M54.9] 11/01/2007  . Essential hypertension [I10] 05/29/2007  . LOW BACK PAIN [M54.5] 05/29/2007  . Diabetes [E11.9] 11/07/2006  . Hyperlipidemia [E78.5] 11/07/2006  . Anxiety state [F41.1] 11/07/2006  . DEPRESSION [F32.9] 11/07/2006  . DIVERTICULOSIS, COLON [K57.30] 11/07/2006  . OSTEOPENIA [M89.9, M94.9] 11/07/2006  . HEPATITIS C [B17.10] 11/05/2006  . FIBROCYSTIC BREAST DISEASE [N60.19] 11/05/2006  . CYST, OVARIAN NEC/NOS [N83.20] 11/05/2006  . HEPATITIS B, HX OF [Z86.19] 11/05/2006  . INSOMNIA, HX OF [Z91.89] 11/05/2006  . TOTAL ABDOMINAL HYSTERECTOMY, HX OF [Z90.79] 03/27/1990    Total Time spent with patient: 1 hour  Subjective:   Crystal Thornton is a 69 y.o. female patient admitted with possible medication withdrawal seizure.  HPI:  Crystal Thornton is a 69 y.o. femaleseen face-to-face for psychiatric consultation and evaluation of paranoid delusions, status post alprazolam withdrawal  seizures. Patient reportedly taken Xanax 1 mg 4 times a day instead of 3 times a day for the last 3 days secondary to being stressed about her [place]. Patient ran out of her medication during this weekend which resulted withdrawal seizures witnessed by family members and then contacted the EMS. Patient reportedly suffering with depression over 10-15 years and being treated by Dr. Cathlean Cower at [facility] clinic. Patient is currently stable without emotional problems and appeared to lying in her bed and reading an iPad. Patient is calm and cooperative and pleasant during this evaluation. Patient has no confusion and oriented to time place person and situation. Patient reportedly stressed about minor things like her son and daughter-in-law being get home temporarily and 32 years old granddaughter being close to puberty. Patient has no psychiatric inpatient hospitalization or outpatient treatment. Patient has no substance abuse or alcoholism. Patient denies current suicidal/homicidal ideation and contracts for safety while in the hospital.   HPI Elements:   Location:  Depression anxiety. Quality:  Fair. Severity:  Recently increased. Timing:  Patient's sister Hospitalized. Duration:  Few days. Context:  Unknown psychosocial stressors.  Past Medical History:  Past Medical History  Diagnosis Date  . Depression   . Diabetes mellitus   . Hyperlipidemia   . Hypertension   . Osteopenia   . Anxiety   . Pain     R. shoulder and lower back and legs  . Hepatitis C     Treated and no longer has  . ANGIOEDEMA 01/24/2010  . ANXIETY 11/07/2006  . BACK PAIN 11/01/2007  . Cough 05/29/2008  . CYST, OVARIAN NEC/NOS 11/05/2006  . DEPRESSION 11/07/2006  . DIABETES MELLITUS, TYPE II 11/07/2006  .  DIVERTICULOSIS, COLON 11/07/2006  . FIBROCYSTIC BREAST DISEASE 11/05/2006  . HEAD TRAUMA, CLOSED 01/24/2010  . HEPATITIS B, HX OF 11/05/2006  . HEPATITIS C 11/05/2006  . HYPERLIPIDEMIA  11/07/2006  . HYPERTENSION 05/29/2007  . INSOMNIA, HX OF 11/05/2006  . LOW BACK PAIN 05/29/2007  . OSTEOPENIA 11/07/2006  . Other specified sites of sprains and strains 08/31/2008  . PULMONARY NODULE, LEFT UPPER LOBE 05/31/2009  . SHOULDER PAIN, RIGHT, CHRONIC 06/02/2010  . SINUSITIS- ACUTE-NOS 12/10/2007  . SKIN LESION 06/02/2010  . TOTAL ABDOMINAL HYSTERECTOMY, HX OF 03/27/1990  . Wears glasses   . Wears dentures     full top  . Seizures     Past Surgical History  Procedure Laterality Date  . Tubal ligation    . Cesarean section    . Shoulder arthroscopy  2008    left  . Ovarian cyst surgery      ruptured  . Appendectomy    . Colonoscopy    . Liver biopsy    . Cervical fusion  2013  . Orif ankle fracture Left 12/03/2012  . External fixation leg Left 12/12/2012    Procedure: LEFT ADJUSTMENT OF EXTERNAL FIXATION UNDER ANESTHESIA;  Surgeon: Wylene Simmer, MD;  Location: [place];  Service: Orthopedics;  Laterality: Left;  . Orif ankle fracture Left 01/02/2013    Procedure: LEFT ANKLE REMOVAL OF EXTERNAL FIXATOR OPEN REDUCTION INTERNAL FIXATION (ORIF) TRIMALLEOLAR FRACTURE;  Surgeon: Wylene Simmer, MD;  Location: [place];  Service: Orthopedics;  Laterality: Left;   Family History:  Family History  Problem Relation Age of Onset  . Liver cancer Mother    Social History:  History  Alcohol Use No     History  Drug Use No    Social History   Social History  . Marital Status: Divorced    Spouse Name: N/A  . Number of Children: N/A  . Years of Education: N/A   Social History Main Topics  . Smoking status: Current Every Day Smoker -- 0.25 packs/day    Types: Cigarettes  . Smokeless tobacco: Never Used  . Alcohol Use: No  . Drug Use: No  . Sexual Activity: Not Asked     Comment: trying to cut down   Other Topics Concern  . None   Social History Narrative   Additional Social History:                          Allergies:   Allergies   Allergen Reactions  . Naproxen Sodium Anaphylaxis  . Sulfonamide Derivatives Other (See Comments)    Causes nephritis  . Ace Inhibitors Swelling    Lip swelling    Labs:  Results for orders placed or performed during the hospital encounter of 11/01/14 (from the past 48 hour(s))  Glucose, capillary     Status: Abnormal   Collection Time: 11/02/14 12:10 PM  Result Value Ref Range   Glucose-Capillary 109 (H) 65 - 99 mg/dL   Comment 1 Notify RN    Comment 2 Document in Chart   Glucose, capillary     Status: Abnormal   Collection Time: 11/02/14  4:42 PM  Result Value Ref Range   Glucose-Capillary 166 (H) 65 - 99 mg/dL   Comment 1 Notify RN    Comment 2 Document in Chart   Glucose, capillary     Status: Abnormal   Collection Time: 11/02/14  8:01 PM  Result Value Ref Range  Glucose-Capillary 162 (H) 65 - 99 mg/dL   Comment 1 Notify RN    Comment 2 Document in Chart   Glucose, capillary     Status: None   Collection Time: 11/02/14 11:50 PM  Result Value Ref Range   Glucose-Capillary 94 65 - 99 mg/dL   Comment 1 Notify RN    Comment 2 Document in Chart   Glucose, capillary     Status: None   Collection Time: 11/03/14  4:06 AM  Result Value Ref Range   Glucose-Capillary 87 65 - 99 mg/dL   Comment 1 Notify RN    Comment 2 Document in Chart   Glucose, capillary     Status: None   Collection Time: 11/03/14  7:55 AM  Result Value Ref Range   Glucose-Capillary 98 65 - 99 mg/dL   Comment 1 Notify RN    Comment 2 Document in Chart   Basic metabolic panel     Status: Abnormal   Collection Time: 11/03/14  9:54 AM  Result Value Ref Range   Sodium 143 135 - 145 mmol/L   Potassium 4.3 3.5 - 5.1 mmol/L   Chloride 111 101 - 111 mmol/L   CO2 24 22 - 32 mmol/L   Glucose, Bld 137 (H) 65 - 99 mg/dL   BUN 11 6 - 20 mg/dL   Creatinine, Ser 0.81 0.44 - 1.00 mg/dL   Calcium 8.6 (L) 8.9 - 10.3 mg/dL   GFR calc non Af Amer >60 >60 mL/min   GFR calc Af Amer >60 >60 mL/min    Comment:  (NOTE) The eGFR has been calculated using the CKD EPI equation. This calculation has not been validated in all clinical situations. eGFR's persistently <60 mL/min signify possible Chronic Kidney Disease.    Anion gap 8 5 - 15  Glucose, capillary     Status: Abnormal   Collection Time: 11/03/14 11:41 AM  Result Value Ref Range   Glucose-Capillary 206 (H) 65 - 99 mg/dL   Comment 1 Document in Chart   Glucose, capillary     Status: Abnormal   Collection Time: 11/03/14  5:16 PM  Result Value Ref Range   Glucose-Capillary 189 (H) 65 - 99 mg/dL   Comment 1 Document in Chart   Glucose, capillary     Status: Abnormal   Collection Time: 11/03/14  8:06 PM  Result Value Ref Range   Glucose-Capillary 151 (H) 65 - 99 mg/dL   Comment 1 Notify RN    Comment 2 Document in Chart   Glucose, capillary     Status: Abnormal   Collection Time: 11/04/14 12:07 AM  Result Value Ref Range   Glucose-Capillary 118 (H) 65 - 99 mg/dL   Comment 1 Notify RN    Comment 2 Document in Chart   Glucose, capillary     Status: Abnormal   Collection Time: 11/04/14  4:16 AM  Result Value Ref Range   Glucose-Capillary 114 (H) 65 - 99 mg/dL   Comment 1 Notify RN    Comment 2 Document in Chart   Glucose, capillary     Status: Abnormal   Collection Time: 11/04/14  8:24 AM  Result Value Ref Range   Glucose-Capillary 128 (H) 65 - 99 mg/dL    Vitals: Blood pressure 159/80, pulse 66, temperature 98 F (36.7 C), temperature source Oral, resp. rate 18, weight 62.596 kg (138 lb), SpO2 95 %.  Risk to Self: Is patient at risk for suicide?: No Risk to Others:   Prior  Inpatient Therapy:   Prior Outpatient Therapy:    Current Facility-Administered Medications  Medication Dose Route Frequency Provider Last Rate Last Dose  . ALPRAZolam Duanne Moron) tablet 1 mg  1 mg Oral TID PRN Marliss Coots, PA-C   1 mg at 11/03/14 1218  . aspirin EC tablet 325 mg  325 mg Oral Daily Eugenie Filler, MD   325 mg at 11/03/14 1012  . atenolol  (TENORMIN) tablet 50 mg  50 mg Oral Daily Eugenie Filler, MD   50 mg at 11/03/14 1012  . enoxaparin (LOVENOX) injection 40 mg  40 mg Subcutaneous Q24H Debbe Odea, MD   40 mg at 11/03/14 1012  . insulin aspart (novoLOG) injection 0-9 Units  0-9 Units Subcutaneous TID WC Debbe Odea, MD   1 Units at 11/04/14 0836  . nicotine (NICODERM CQ - dosed in mg/24 hours) patch 21 mg  21 mg Transdermal Daily Kathie Dike, MD   21 mg at 11/03/14 0540  . ondansetron (ZOFRAN) tablet 4 mg  4 mg Oral Q6H PRN Debbe Odea, MD       Or  . ondansetron (ZOFRAN) injection 4 mg  4 mg Intravenous Q6H PRN Debbe Odea, MD      . oxyCODONE (Oxy IR/ROXICODONE) immediate release tablet 20 mg  20 mg Oral Q6H PRN Eugenie Filler, MD   20 mg at 11/04/14 0835  . pravastatin (PRAVACHOL) tablet 80 mg  80 mg Oral q1800 Eugenie Filler, MD   80 mg at 11/03/14 1724  . sertraline (ZOLOFT) tablet 200 mg  200 mg Oral Daily Eugenie Filler, MD   200 mg at 11/03/14 1012    Musculoskeletal: Strength & Muscle Tone: within normal limits Gait & Station: unable to stand Patient leans: N/A  Psychiatric Specialty Exam: Physical Exam as per history and physical  ROS chronic back pain, depression and anxiety but denied shortness of breath or chest pain, nausea and vomiting's. No Fever-chills, No Headache, No changes with Vision or hearing, reports vertigo No problems swallowing food or Liquids, No Chest pain, Cough or Shortness of Breath, No Abdominal pain, No Nausea or Vommitting, Bowel movements are regular, No Blood in stool or Urine, No dysuria, No new skin rashes or bruises, No new joints pains-aches,  No new weakness, tingling, numbness in any extremity, No recent weight gain or loss, No polyuria, polydypsia or polyphagia,   A full 10 point Review of Systems was done, except as stated above, all other Review of Systems were negative.  Blood pressure 159/80, pulse 66, temperature 98 F (36.7 C), temperature  source Oral, resp. rate 18, weight 62.596 kg (138 lb), SpO2 95 %.Body mass index is 21.28 kg/(m^2).  General Appearance: Casual  Eye Contact::  Good  Speech:  Clear and Coherent  Volume:  Normal  Mood:  Anxious  Affect:  Appropriate and Congruent  Thought Process:  Coherent and Goal Directed  Orientation:  Full (Time, Place, and Person)  Thought Content:  WDL  Suicidal Thoughts:  No  Homicidal Thoughts:  No  Memory:  Immediate;   Good Recent;   Good Remote;   Good  Judgement:  Intact  Insight:  Fair  Psychomotor Activity:  Normal  Concentration:  Good  Recall:  Good  Fund of Knowledge:Good  Language: Good  Akathisia:  Negative  Handed:  Right  AIMS (if indicated):     Assets:  Communication Skills Desire for Improvement Financial Resources/Insurance Housing Intimacy Leisure Time Resilience Social Support Heritage manager  ADL's:  Intact  Cognition: WNL  Sleep:      Medical Decision Making: Review of Psycho-Social Stressors (1), Review or order clinical lab tests (1), Established Problem, Worsening (2), Review of Last Therapy Session (1), Review or order medicine tests (1), Review of Medication Regimen & Side Effects (2) and Review of New Medication or Change in Dosage (2)  Treatment Plan Summary: Patient has increased to stress and anxiety secondary to her sister being hospitalized which she associated with her mother being hospitalized and died next day long time ago. Patient denies current suicidal/homicidal ideation, depression, anxiety and psychosis. Patient can contract for safety while in the hospital.  Daily contact with patient to assess and evaluate symptoms and progress in treatment and Medication management  Plan:  Case discussed with Dr. Grandville Silos No safety concerns while in the hospital Continue her home medication which is Zoloft 100 mg daily for depression  Continue alprazolam 1 mg 3 times daily as needed which she  has been taking over the years Recommended not to take more than 3 times daily without M.D. recommendation Patient may benefit from medium to long-acting benzodiazepine's or non-benzodiazepine antianxiety medication to prevent benzodiazepine withdrawal seizures Patient does not meet criteria for psychiatric inpatient admission. Supportive therapy provided about ongoing stressors.  Appreciate psychiatric consultation and will sign off at this time Please contact [phone] or [phone] if needs further assistance   Disposition: Patient will benefit from out-of-home placement briefly secondary to recent withdrawal seizures and has limited support at home. Patient will be referred to the outpatient medication management when medically stable.  Rick Carruthers,JANARDHAHA R. 11/04/2014 9:53 AM

## 2014-11-05 ENCOUNTER — Telehealth: Payer: Self-pay | Admitting: *Deleted

## 2014-11-05 LAB — GLUCOSE, CAPILLARY: Glucose-Capillary: 111 mg/dL — ABNORMAL HIGH (ref 65–99)

## 2014-11-05 NOTE — Telephone Encounter (Signed)
Pt was on tcm list d/c 11/04/14 and was sent to SNF ([facility])...Johny Chess

## 2014-11-08 DIAGNOSIS — F329 Major depressive disorder, single episode, unspecified: Secondary | ICD-10-CM | POA: Diagnosis not present

## 2014-11-08 DIAGNOSIS — D229 Melanocytic nevi, unspecified: Secondary | ICD-10-CM | POA: Diagnosis not present

## 2014-11-08 DIAGNOSIS — M25572 Pain in left ankle and joints of left foot: Secondary | ICD-10-CM | POA: Diagnosis not present

## 2014-11-08 DIAGNOSIS — F17200 Nicotine dependence, unspecified, uncomplicated: Secondary | ICD-10-CM | POA: Diagnosis not present

## 2014-11-08 DIAGNOSIS — F419 Anxiety disorder, unspecified: Secondary | ICD-10-CM | POA: Diagnosis not present

## 2014-11-08 DIAGNOSIS — E785 Hyperlipidemia, unspecified: Secondary | ICD-10-CM | POA: Diagnosis not present

## 2014-11-08 DIAGNOSIS — G40309 Generalized idiopathic epilepsy and epileptic syndromes, not intractable, without status epilepticus: Secondary | ICD-10-CM | POA: Diagnosis not present

## 2014-11-17 ENCOUNTER — Encounter: Payer: Self-pay | Admitting: Internal Medicine

## 2014-11-17 ENCOUNTER — Other Ambulatory Visit (INDEPENDENT_AMBULATORY_CARE_PROVIDER_SITE_OTHER): Payer: Commercial Managed Care - HMO

## 2014-11-17 ENCOUNTER — Ambulatory Visit (INDEPENDENT_AMBULATORY_CARE_PROVIDER_SITE_OTHER): Payer: Commercial Managed Care - HMO | Admitting: Internal Medicine

## 2014-11-17 VITALS — BP 106/66 | HR 74 | Temp 98.0°F | Ht 68.0 in | Wt 142.0 lb

## 2014-11-17 DIAGNOSIS — Z0000 Encounter for general adult medical examination without abnormal findings: Secondary | ICD-10-CM

## 2014-11-17 DIAGNOSIS — G894 Chronic pain syndrome: Secondary | ICD-10-CM

## 2014-11-17 DIAGNOSIS — E119 Type 2 diabetes mellitus without complications: Secondary | ICD-10-CM

## 2014-11-17 DIAGNOSIS — F411 Generalized anxiety disorder: Secondary | ICD-10-CM

## 2014-11-17 LAB — URINALYSIS, ROUTINE W REFLEX MICROSCOPIC
BILIRUBIN URINE: NEGATIVE
Hgb urine dipstick: NEGATIVE
KETONES UR: NEGATIVE
Leukocytes, UA: NEGATIVE
NITRITE: NEGATIVE
SPECIFIC GRAVITY, URINE: 1.015 (ref 1.000–1.030)
Total Protein, Urine: NEGATIVE
Urine Glucose: NEGATIVE
Urobilinogen, UA: 0.2 (ref 0.0–1.0)
pH: 7.5 (ref 5.0–8.0)

## 2014-11-17 LAB — CBC WITH DIFFERENTIAL/PLATELET
Basophils Absolute: 0 10*3/uL (ref 0.0–0.1)
Basophils Relative: 0.3 % (ref 0.0–3.0)
EOS PCT: 1.1 % (ref 0.0–5.0)
Eosinophils Absolute: 0.1 10*3/uL (ref 0.0–0.7)
HCT: 44.7 % (ref 36.0–46.0)
HEMOGLOBIN: 15.2 g/dL — AB (ref 12.0–15.0)
LYMPHS ABS: 1.5 10*3/uL (ref 0.7–4.0)
Lymphocytes Relative: 17.9 % (ref 12.0–46.0)
MCHC: 34 g/dL (ref 30.0–36.0)
MCV: 92.1 fl (ref 78.0–100.0)
MONO ABS: 0.7 10*3/uL (ref 0.1–1.0)
Monocytes Relative: 7.8 % (ref 3.0–12.0)
Neutro Abs: 6.2 10*3/uL (ref 1.4–7.7)
Neutrophils Relative %: 72.9 % (ref 43.0–77.0)
Platelets: 333 10*3/uL (ref 150.0–400.0)
RBC: 4.85 Mil/uL (ref 3.87–5.11)
RDW: 13.3 % (ref 11.5–15.5)
WBC: 8.6 10*3/uL (ref 4.0–10.5)

## 2014-11-17 LAB — HEMOGLOBIN A1C: Hgb A1c MFr Bld: 6.4 % (ref 4.6–6.5)

## 2014-11-17 LAB — HEPATIC FUNCTION PANEL
ALK PHOS: 106 U/L (ref 39–117)
ALT: 17 U/L (ref 0–35)
AST: 19 U/L (ref 0–37)
Albumin: 4.3 g/dL (ref 3.5–5.2)
BILIRUBIN TOTAL: 0.5 mg/dL (ref 0.2–1.2)
Bilirubin, Direct: 0.1 mg/dL (ref 0.0–0.3)
Total Protein: 8 g/dL (ref 6.0–8.3)

## 2014-11-17 LAB — LIPID PANEL
CHOL/HDL RATIO: 3
Cholesterol: 171 mg/dL (ref 0–200)
HDL: 49.1 mg/dL (ref >39.00)
LDL CALC: 101 mg/dL — AB (ref 0–99)
NonHDL: 122.01
Triglycerides: 105 mg/dL (ref 0.0–149.0)
VLDL: 21 mg/dL (ref 0.0–40.0)

## 2014-11-17 LAB — MICROALBUMIN / CREATININE URINE RATIO
CREATININE, U: 98.5 mg/dL
MICROALB UR: 2.7 mg/dL — AB (ref 0.0–1.9)
Microalb Creat Ratio: 2.7 mg/g (ref 0.0–30.0)

## 2014-11-17 LAB — BASIC METABOLIC PANEL
BUN: 17 mg/dL (ref 6–23)
CO2: 31 mEq/L (ref 19–32)
Calcium: 10.4 mg/dL (ref 8.4–10.5)
Chloride: 102 mEq/L (ref 96–112)
Creatinine, Ser: 0.83 mg/dL (ref 0.40–1.20)
GFR: 72.56 mL/min (ref >60.00)
Glucose, Bld: 113 mg/dL — ABNORMAL HIGH (ref 70–99)
POTASSIUM: 5.2 meq/L — AB (ref 3.5–5.1)
SODIUM: 140 meq/L (ref 135–145)

## 2014-11-17 LAB — TSH: TSH: 0.32 u[IU]/mL — AB (ref 0.35–4.50)

## 2014-11-17 MED ORDER — CLONAZEPAM 1 MG PO TABS: 1.0000 mg | ORAL_TABLET | Freq: Two times a day (BID) | ORAL | Status: DC | PRN

## 2014-11-17 NOTE — Progress Notes (Signed)
Pre visit review using our clinic review tool, if applicable. No additional management support is needed unless otherwise documented below in the visit note. 

## 2014-11-17 NOTE — Patient Instructions (Addendum)
OK to stop the xanax when the bottle is done  Please take all new medication as prescribed  - the klonopin 1 mg twice per day (longer acting)  Please continue all other medications as before, and refills have been done if requested.  Please have the pharmacy call with any other refills you may need.  Please continue your efforts at being more active, low cholesterol diet, and weight control.  You are otherwise up to date with prevention measures today.  You will be contacted regarding the referral for: Dr Primus Bravo for pain management  Please keep your appointments with your specialists as you may have planned  Please go to the LAB in the Basement (turn left off the elevator) for the tests to be done today  You will be contacted by phone if any changes need to be made immediately.  Otherwise, you will receive a letter about your results with an explanation, but please check with MyChart first.  Please remember to sign up for MyChart if you have not done so, as this will be important to you in the future with finding out test results, communicating by private email, and scheduling acute appointments online when needed.  Please return in 6 months, or sooner if needed, with Lab testing done 3-5 days before

## 2014-11-17 NOTE — Assessment & Plan Note (Signed)
Ok for pain management referral as pt requsets

## 2014-11-17 NOTE — Progress Notes (Signed)
Subjective:    Patient ID: Crystal Thornton, female    DOB: [birth year], 69 y.o.   MRN: [id number]  HPI  Here for wellness and f/u;  Overall doing ok;  Pt denies Chest pain, worsening SOB, DOE, wheezing, orthopnea, PND, worsening LE edema, palpitations, dizziness or syncope.  Pt denies neurological change such as new headache, facial or extremity weakness.  Pt denies polydipsia, polyuria, or low sugar symptoms. Pt states overall good compliance with treatment and medications, good tolerability, and has been trying to follow appropriate diet.  Pt denies worsening depressive symptoms, suicidal ideation or panic. No fever, night sweats, wt loss, loss of appetite, or other constitutional symptoms.  Pt states good ability with ADL's, has low fall risk, home safety reviewed and adequate, no other significant changes in hearing or vision, and only occasionally active with exercise. Here after hospn after mult stressors at one time, took more xanax than prescribed, then ran out with prob w/d siezure afte,r x 1.  Re-started xanax at d/c, now doing well at home with son and granddaughter in the home, and another granddaughter who is a Quarry manager.  Released from PT.  No further siezure or paranoia.   Asks to see Dr Zada Girt mangement, needs formal referral for insurance purposes.   Past Medical History  Diagnosis Date  . Depression   . Diabetes mellitus   . Hyperlipidemia   . Hypertension   . Osteopenia   . Anxiety   . Pain     R. shoulder and lower back and legs  . Hepatitis C     Treated and no longer has  . ANGIOEDEMA 01/24/2010  . ANXIETY 11/07/2006  . BACK PAIN 11/01/2007  . Cough 05/29/2008  . CYST, OVARIAN NEC/NOS 11/05/2006  . DEPRESSION 11/07/2006  . DIABETES MELLITUS, TYPE II 11/07/2006  . DIVERTICULOSIS, COLON 11/07/2006  . FIBROCYSTIC BREAST DISEASE 11/05/2006  . HEAD TRAUMA, CLOSED 01/24/2010  . HEPATITIS B, HX OF 11/05/2006  . HEPATITIS C 11/05/2006  . HYPERLIPIDEMIA 11/07/2006  . HYPERTENSION 05/29/2007   . INSOMNIA, HX OF 11/05/2006  . LOW BACK PAIN 05/29/2007  . OSTEOPENIA 11/07/2006  . Other specified sites of sprains and strains 08/31/2008  . PULMONARY NODULE, LEFT UPPER LOBE 05/31/2009  . SHOULDER PAIN, RIGHT, CHRONIC 06/02/2010  . SINUSITIS- ACUTE-NOS 12/10/2007  . SKIN LESION 06/02/2010  . TOTAL ABDOMINAL HYSTERECTOMY, HX OF 03/27/1990  . Wears glasses   . Wears dentures     full top  . Seizures    Past Surgical History  Procedure Laterality Date  . Tubal ligation    . Cesarean section    . Shoulder arthroscopy  2008    left  . Ovarian cyst surgery      ruptured  . Appendectomy    . Colonoscopy    . Liver biopsy    . Cervical fusion  2013  . Orif ankle fracture Left 12/03/2012  . External fixation leg Left 12/12/2012    Procedure: LEFT ADJUSTMENT OF EXTERNAL FIXATION UNDER ANESTHESIA;  Surgeon: Wylene Simmer, MD;  Location: [place];  Service: Orthopedics;  Laterality: Left;  . Orif ankle fracture Left 01/02/2013    Procedure: LEFT ANKLE REMOVAL OF EXTERNAL FIXATOR OPEN REDUCTION INTERNAL FIXATION (ORIF) TRIMALLEOLAR FRACTURE;  Surgeon: Wylene Simmer, MD;  Location: [place];  Service: Orthopedics;  Laterality: Left;    reports that she has been smoking Cigarettes.  She has been smoking about 0.25 packs per day. She has never used smokeless  tobacco. She reports that she does not drink alcohol or use illicit drugs. family history includes Liver cancer in her mother. Allergies  Allergen Reactions  . Naproxen Sodium Anaphylaxis  . Sulfonamide Derivatives Other (See Comments)    Causes nephritis  . Ace Inhibitors Swelling    Lip swelling   Current Outpatient Prescriptions on File Prior to Visit  Medication Sig Dispense Refill  . aspirin EC 325 MG tablet Take 325 mg by mouth daily.    Marland Kitchen atenolol (TENORMIN) 50 MG tablet Take 1 tablet (50 mg total) by mouth daily. 90 tablet 3  . B Complex-C (B-COMPLEX WITH VITAMIN C) tablet Take 1 tablet by mouth daily.    .  cyclobenzaprine (FLEXERIL) 10 MG tablet Take 10 mg by mouth 3 (three) times daily.     Marland Kitchen GINKGO BILOBA COMPLEX PO Take 1 tablet by mouth daily.    . Ginseng 500 MG CAPS Take 1 capsule by mouth daily.    . metFORMIN (GLUCOPHAGE) 500 MG tablet Take 2 tablets (1,000 mg total) by mouth daily with breakfast. 180 tablet 3  . Multiple Vitamins-Minerals (CENTRUM SILVER PO) Take 1 tablet by mouth daily.      . nicotine (NICODERM CQ - DOSED IN MG/24 HOURS) 21 mg/24hr patch Place 1 patch (21 mg total) onto the skin daily. 28 patch 0  . oxycodone (ROXICODONE) 30 MG immediate release tablet 30 mg 5 times daily as needed for pain. 20 tablet 0  . pravastatin (PRAVACHOL) 40 MG tablet Take 2 tablets (80 mg total) by mouth daily. 90 tablet 3  . sertraline (ZOLOFT) 100 MG tablet Take 2 tablets (200 mg total) by mouth daily. 30 tablet 0   No current facility-administered medications on file prior to visit.   Review of Systems Constitutional: Negative for increased diaphoresis, other activity, appetite or siginficant weight change other than noted HENT: Negative for worsening hearing loss, ear pain, facial swelling, mouth sores and neck stiffness.   Eyes: Negative for other worsening pain, redness or visual disturbance.  Respiratory: Negative for shortness of breath and wheezing  Cardiovascular: Negative for chest pain and palpitations.  Gastrointestinal: Negative for diarrhea, blood in stool, abdominal distention or other pain Genitourinary: Negative for hematuria, flank pain or change in urine volume.  Musculoskeletal: Negative for myalgias or other joint complaints.  Skin: Negative for color change and wound or drainage.  Neurological: Negative for syncope and numbness. other than noted Hematological: Negative for adenopathy. or other swelling Psychiatric/Behavioral: Negative for hallucinations, SI, self-injury, decreased concentration or other worsening agitation.      Objective:   Physical Exam BP  106/66 mmHg  Pulse 74  Temp(Src) 98 F (36.7 C) (Oral)  Ht 5\' 8"  (1.727 m)  Wt 142 lb (64.411 kg)  BMI 21.60 kg/m2  SpO2 96% VS noted,  Constitutional: Pt is oriented to person, place, and time. Appears well-developed and well-nourished, in no significant distress Head: Normocephalic and atraumatic.  Right Ear: External ear normal.  Left Ear: External ear normal.  Nose: Nose normal.  Mouth/Throat: Oropharynx is clear and moist.  Eyes: Conjunctivae and EOM are normal. Pupils are equal, round, and reactive to light.  Neck: Normal range of motion. Neck supple. No JVD present. No tracheal deviation present or significant neck LA or mass Cardiovascular: Normal rate, regular rhythm, normal heart sounds and intact distal pulses.   Pulmonary/Chest: Effort normal and breath sounds without rales or wheezing  Abdominal: Soft. Bowel sounds are normal. NT. No HSM  Musculoskeletal: Normal range  of motion. Exhibits no edema.  Lymphadenopathy:  Has no cervical adenopathy.  Neurological: Pt is alert and oriented to person, place, and time. Pt has normal reflexes. No cranial nerve deficit. Motor grossly intact Skin: Skin is warm and dry. No rash noted.  Psychiatric:  Has normal mood and affect. Behavior is normal.      Assessment & Plan:

## 2014-11-17 NOTE — Assessment & Plan Note (Signed)
stable overall by history and exam, recent data reviewed with pt, and pt to continue medical treatment as before,  to f/u any worsening symptoms or concerns Lab Results  Component Value Date   HGBA1C 6.2 08/12/2014   For f/u lab

## 2014-11-17 NOTE — Assessment & Plan Note (Signed)

## 2014-11-17 NOTE — Assessment & Plan Note (Signed)
Ok for trial change xanax to klonopin 1 mg bid prn,  to f/u any worsening symptoms or concerns

## 2014-11-26 DIAGNOSIS — M791 Myalgia: Secondary | ICD-10-CM | POA: Diagnosis not present

## 2014-11-26 DIAGNOSIS — M461 Sacroiliitis, not elsewhere classified: Secondary | ICD-10-CM | POA: Diagnosis not present

## 2014-11-26 DIAGNOSIS — M546 Pain in thoracic spine: Secondary | ICD-10-CM | POA: Diagnosis not present

## 2014-11-26 DIAGNOSIS — M792 Neuralgia and neuritis, unspecified: Secondary | ICD-10-CM | POA: Diagnosis not present

## 2014-12-31 DIAGNOSIS — M546 Pain in thoracic spine: Secondary | ICD-10-CM | POA: Diagnosis not present

## 2014-12-31 DIAGNOSIS — M791 Myalgia: Secondary | ICD-10-CM | POA: Diagnosis not present

## 2014-12-31 DIAGNOSIS — M792 Neuralgia and neuritis, unspecified: Secondary | ICD-10-CM | POA: Diagnosis not present

## 2014-12-31 DIAGNOSIS — M461 Sacroiliitis, not elsewhere classified: Secondary | ICD-10-CM | POA: Diagnosis not present

## 2015-01-28 DIAGNOSIS — M791 Myalgia: Secondary | ICD-10-CM | POA: Diagnosis not present

## 2015-01-28 DIAGNOSIS — M47816 Spondylosis without myelopathy or radiculopathy, lumbar region: Secondary | ICD-10-CM | POA: Diagnosis not present

## 2015-01-28 DIAGNOSIS — M546 Pain in thoracic spine: Secondary | ICD-10-CM | POA: Diagnosis not present

## 2015-01-28 DIAGNOSIS — M792 Neuralgia and neuritis, unspecified: Secondary | ICD-10-CM | POA: Diagnosis not present

## 2015-01-28 DIAGNOSIS — M461 Sacroiliitis, not elsewhere classified: Secondary | ICD-10-CM | POA: Diagnosis not present

## 2015-01-28 DIAGNOSIS — M533 Sacrococcygeal disorders, not elsewhere classified: Secondary | ICD-10-CM | POA: Diagnosis not present

## 2015-01-28 DIAGNOSIS — Z79891 Long term (current) use of opiate analgesic: Secondary | ICD-10-CM | POA: Diagnosis not present

## 2015-02-02 ENCOUNTER — Telehealth: Payer: Self-pay | Admitting: Internal Medicine

## 2015-02-02 MED ORDER — CLONAZEPAM 1 MG PO TABS: 1.0000 mg | ORAL_TABLET | Freq: Two times a day (BID) | ORAL | Status: DC | PRN

## 2015-02-02 NOTE — Telephone Encounter (Signed)
Pt called in and said that she only got a script for 20 Klonopin.  She said that is not enough for a month and needs at least one a day.  She is requesting a call back from a nurse

## 2015-02-02 NOTE — Telephone Encounter (Signed)
Pt advised, Rx faxed to pharmacy 

## 2015-02-02 NOTE — Telephone Encounter (Signed)
Done hardcopy to Dahlia  

## 2015-02-05 NOTE — Telephone Encounter (Signed)
Verbal refill given to pharmacist [place]. He states he will contact pt to advise of same

## 2015-02-05 NOTE — Telephone Encounter (Signed)
Pt called and states [facility] has not receive the prescription for her Klonopin Can you please resend it

## 2015-02-25 DIAGNOSIS — M461 Sacroiliitis, not elsewhere classified: Secondary | ICD-10-CM | POA: Diagnosis not present

## 2015-02-25 DIAGNOSIS — M791 Myalgia: Secondary | ICD-10-CM | POA: Diagnosis not present

## 2015-02-25 DIAGNOSIS — M792 Neuralgia and neuritis, unspecified: Secondary | ICD-10-CM | POA: Diagnosis not present

## 2015-02-25 DIAGNOSIS — M546 Pain in thoracic spine: Secondary | ICD-10-CM | POA: Diagnosis not present

## 2015-04-01 DIAGNOSIS — M546 Pain in thoracic spine: Secondary | ICD-10-CM | POA: Diagnosis not present

## 2015-04-01 DIAGNOSIS — M461 Sacroiliitis, not elsewhere classified: Secondary | ICD-10-CM | POA: Diagnosis not present

## 2015-04-01 DIAGNOSIS — M792 Neuralgia and neuritis, unspecified: Secondary | ICD-10-CM | POA: Diagnosis not present

## 2015-04-01 DIAGNOSIS — M791 Myalgia: Secondary | ICD-10-CM | POA: Diagnosis not present

## 2015-04-29 DIAGNOSIS — M546 Pain in thoracic spine: Secondary | ICD-10-CM | POA: Diagnosis not present

## 2015-04-29 DIAGNOSIS — M461 Sacroiliitis, not elsewhere classified: Secondary | ICD-10-CM | POA: Diagnosis not present

## 2015-04-29 DIAGNOSIS — M791 Myalgia: Secondary | ICD-10-CM | POA: Diagnosis not present

## 2015-04-29 DIAGNOSIS — M792 Neuralgia and neuritis, unspecified: Secondary | ICD-10-CM | POA: Diagnosis not present

## 2015-05-04 ENCOUNTER — Other Ambulatory Visit: Payer: Self-pay | Admitting: Internal Medicine

## 2015-05-20 ENCOUNTER — Ambulatory Visit: Payer: Commercial Managed Care - HMO | Admitting: Internal Medicine

## 2015-05-27 DIAGNOSIS — M546 Pain in thoracic spine: Secondary | ICD-10-CM | POA: Diagnosis not present

## 2015-05-27 DIAGNOSIS — M791 Myalgia: Secondary | ICD-10-CM | POA: Diagnosis not present

## 2015-05-27 DIAGNOSIS — M461 Sacroiliitis, not elsewhere classified: Secondary | ICD-10-CM | POA: Diagnosis not present

## 2015-05-27 DIAGNOSIS — M792 Neuralgia and neuritis, unspecified: Secondary | ICD-10-CM | POA: Diagnosis not present

## 2015-06-16 ENCOUNTER — Other Ambulatory Visit: Payer: Self-pay | Admitting: Internal Medicine

## 2015-06-16 NOTE — Telephone Encounter (Signed)
Done hardcopy to Corinne  

## 2015-06-16 NOTE — Telephone Encounter (Signed)
Please advise, requesting refill

## 2015-06-23 ENCOUNTER — Ambulatory Visit: Payer: Self-pay | Admitting: Internal Medicine

## 2015-06-24 ENCOUNTER — Telehealth: Payer: Self-pay | Admitting: Emergency Medicine

## 2015-06-24 ENCOUNTER — Encounter: Payer: Self-pay | Admitting: Internal Medicine

## 2015-06-24 ENCOUNTER — Other Ambulatory Visit (INDEPENDENT_AMBULATORY_CARE_PROVIDER_SITE_OTHER): Payer: Commercial Managed Care - HMO

## 2015-06-24 ENCOUNTER — Ambulatory Visit (INDEPENDENT_AMBULATORY_CARE_PROVIDER_SITE_OTHER): Payer: Commercial Managed Care - HMO | Admitting: Internal Medicine

## 2015-06-24 VITALS — BP 112/60 | HR 69 | Temp 97.9°F | Resp 20 | Wt 145.0 lb

## 2015-06-24 DIAGNOSIS — R3915 Urgency of urination: Secondary | ICD-10-CM

## 2015-06-24 DIAGNOSIS — Z0000 Encounter for general adult medical examination without abnormal findings: Secondary | ICD-10-CM

## 2015-06-24 DIAGNOSIS — M791 Myalgia: Secondary | ICD-10-CM | POA: Diagnosis not present

## 2015-06-24 DIAGNOSIS — E119 Type 2 diabetes mellitus without complications: Secondary | ICD-10-CM

## 2015-06-24 DIAGNOSIS — M546 Pain in thoracic spine: Secondary | ICD-10-CM | POA: Diagnosis not present

## 2015-06-24 DIAGNOSIS — R1011 Right upper quadrant pain: Secondary | ICD-10-CM | POA: Diagnosis not present

## 2015-06-24 DIAGNOSIS — R109 Unspecified abdominal pain: Secondary | ICD-10-CM | POA: Diagnosis not present

## 2015-06-24 DIAGNOSIS — M792 Neuralgia and neuritis, unspecified: Secondary | ICD-10-CM | POA: Diagnosis not present

## 2015-06-24 DIAGNOSIS — Z0189 Encounter for other specified special examinations: Secondary | ICD-10-CM | POA: Diagnosis not present

## 2015-06-24 DIAGNOSIS — R10A1 Flank pain, right side: Secondary | ICD-10-CM | POA: Insufficient documentation

## 2015-06-24 DIAGNOSIS — E875 Hyperkalemia: Secondary | ICD-10-CM

## 2015-06-24 DIAGNOSIS — M461 Sacroiliitis, not elsewhere classified: Secondary | ICD-10-CM | POA: Diagnosis not present

## 2015-06-24 LAB — CBC WITH DIFFERENTIAL/PLATELET
BASOS PCT: 0.4 % (ref 0.0–3.0)
Basophils Absolute: 0 10*3/uL (ref 0.0–0.1)
EOS PCT: 4.7 % (ref 0.0–5.0)
Eosinophils Absolute: 0.4 10*3/uL (ref 0.0–0.7)
HCT: 47.6 % — ABNORMAL HIGH (ref 36.0–46.0)
Hemoglobin: 16 g/dL — ABNORMAL HIGH (ref 12.0–15.0)
LYMPHS ABS: 2.1 10*3/uL (ref 0.7–4.0)
Lymphocytes Relative: 22.7 % (ref 12.0–46.0)
MCHC: 33.7 g/dL (ref 30.0–36.0)
MCV: 90.6 fl (ref 78.0–100.0)
MONO ABS: 0.8 10*3/uL (ref 0.1–1.0)
Monocytes Relative: 8.4 % (ref 3.0–12.0)
NEUTROS PCT: 63.8 % (ref 43.0–77.0)
Neutro Abs: 5.9 10*3/uL (ref 1.4–7.7)
PLATELETS: 348 10*3/uL (ref 150.0–400.0)
RBC: 5.25 Mil/uL — ABNORMAL HIGH (ref 3.87–5.11)
RDW: 13.6 % (ref 11.5–15.5)
WBC: 9.3 10*3/uL (ref 4.0–10.5)

## 2015-06-24 LAB — MICROALBUMIN / CREATININE URINE RATIO
CREATININE, U: 103.6 mg/dL
Microalb Creat Ratio: 2.2 mg/g (ref 0.0–30.0)
Microalb, Ur: 2.3 mg/dL — ABNORMAL HIGH (ref 0.0–1.9)

## 2015-06-24 LAB — BASIC METABOLIC PANEL
BUN: 28 mg/dL — ABNORMAL HIGH (ref 6–23)
CO2: 30 mEq/L (ref 19–32)
Calcium: 10.7 mg/dL — ABNORMAL HIGH (ref 8.4–10.5)
Chloride: 105 mEq/L (ref 96–112)
Creatinine, Ser: 0.81 mg/dL (ref 0.40–1.20)
GFR: 74.5 mL/min (ref >60.00)
GLUCOSE: 113 mg/dL — AB (ref 70–99)
POTASSIUM: 6.2 meq/L — AB (ref 3.5–5.1)
SODIUM: 139 meq/L (ref 135–145)

## 2015-06-24 LAB — URINALYSIS, ROUTINE W REFLEX MICROSCOPIC
Bilirubin Urine: NEGATIVE
Hgb urine dipstick: NEGATIVE
KETONES UR: NEGATIVE
NITRITE: NEGATIVE
RBC / HPF: NONE SEEN (ref 0–?)
SPECIFIC GRAVITY, URINE: 1.02 (ref 1.000–1.030)
Total Protein, Urine: NEGATIVE
Urine Glucose: NEGATIVE
Urobilinogen, UA: 0.2 (ref 0.0–1.0)
pH: 6 (ref 5.0–8.0)

## 2015-06-24 LAB — LIPID PANEL
CHOL/HDL RATIO: 3
CHOLESTEROL: 181 mg/dL (ref 0–200)
HDL: 57.3 mg/dL (ref >39.00)
LDL CALC: 88 mg/dL (ref 0–99)
NonHDL: 123.2
Triglycerides: 174 mg/dL — ABNORMAL HIGH (ref 0.0–149.0)
VLDL: 34.8 mg/dL (ref 0.0–40.0)

## 2015-06-24 LAB — HEPATIC FUNCTION PANEL
ALK PHOS: 126 U/L — AB (ref 39–117)
ALT: 13 U/L (ref 0–35)
AST: 14 U/L (ref 0–37)
Albumin: 4.5 g/dL (ref 3.5–5.2)
BILIRUBIN TOTAL: 0.4 mg/dL (ref 0.2–1.2)
Bilirubin, Direct: 0 mg/dL (ref 0.0–0.3)
Total Protein: 8.1 g/dL (ref 6.0–8.3)

## 2015-06-24 LAB — TSH: TSH: 0.41 u[IU]/mL (ref 0.35–4.50)

## 2015-06-24 LAB — HEMOGLOBIN A1C: Hgb A1c MFr Bld: 6.6 % — ABNORMAL HIGH (ref 4.6–6.5)

## 2015-06-24 MED ORDER — FESOTERODINE FUMARATE ER 4 MG PO TB24: 4.0000 mg | ORAL_TABLET | Freq: Every day | ORAL | Status: DC

## 2015-06-24 NOTE — Progress Notes (Signed)
Subjective:    Patient ID: Crystal Thornton, female    DOB: 18-Dec-1945, 70 y.o.   MRN: [id number]  HPI  Here to f/u; overall doing ok,  Pt denies chest pain, increasing sob or doe, wheezing, orthopnea, PND, increased LE swelling, palpitations, dizziness or syncope.  Pt denies new neurological symptoms such as new headache, or facial or extremity weakness or numbness.  Pt denies polydipsia, polyuria, or low sugar episode.   Pt denies new neurological symptoms such as new headache, or facial or extremity weakness or numbness.   Pt states overall good compliance with meds, mostly trying to follow appropriate diet, with wt overall up several lbs,  but little exercise however. Wt Readings from Last 3 Encounters:  06/24/15 145 lb (65.772 kg)  11/17/14 142 lb (64.411 kg)  11/01/14 138 lb (62.596 kg)  Due for cpx in 2 wks.  Also Denies urinary symptoms such as dysuria, frequenc, hematuria or n/v, fever, chills, but has recurring random urgency worse at night and has to rush to BR, with occas wetting accident, for over 1 mo.  Denies worsening reflux, dysphagia, n/v, bowel change or blood, but has 2 wks intermittent mild ruq pain/tender since a fall with tripping, Pain better with pain medication, o/w nonpositional, nonexertional but can be worse with deep breathing and lying on the right side, and seems also to occas radiate to back/right flank Past Medical History  Diagnosis Date  . Depression   . Diabetes mellitus   . Hyperlipidemia   . Hypertension   . Osteopenia   . Anxiety   . Pain     R. shoulder and lower back and legs  . Hepatitis C     Treated and no longer has  . ANGIOEDEMA 01/24/2010  . ANXIETY 11/07/2006  . BACK PAIN 11/01/2007  . Cough 05/29/2008  . CYST, OVARIAN NEC/NOS 11/05/2006  . DEPRESSION 11/07/2006  . DIABETES MELLITUS, TYPE II 11/07/2006  . DIVERTICULOSIS, COLON 11/07/2006  . FIBROCYSTIC BREAST DISEASE 11/05/2006  . HEAD TRAUMA, CLOSED 01/24/2010  . HEPATITIS B, HX OF 11/05/2006  .  HEPATITIS C 11/05/2006  . HYPERLIPIDEMIA 11/07/2006  . HYPERTENSION 05/29/2007  . INSOMNIA, HX OF 11/05/2006  . LOW BACK PAIN 05/29/2007  . OSTEOPENIA 11/07/2006  . Other specified sites of sprains and strains 08/31/2008  . PULMONARY NODULE, LEFT UPPER LOBE 05/31/2009  . SHOULDER PAIN, RIGHT, CHRONIC 06/02/2010  . SINUSITIS- ACUTE-NOS 12/10/2007  . SKIN LESION 06/02/2010  . TOTAL ABDOMINAL HYSTERECTOMY, HX OF 03/27/1990  . Wears glasses   . Wears dentures     full top  . Seizures [facility])    Past Surgical History  Procedure Laterality Date  . Tubal ligation    . Cesarean section    . Shoulder arthroscopy  2008    left  . Ovarian cyst surgery      ruptured  . Appendectomy    . Colonoscopy    . Liver biopsy    . Cervical fusion  2013  . Orif ankle fracture Left 12/03/2012  . External fixation leg Left 12/12/2012    Procedure: LEFT ADJUSTMENT OF EXTERNAL FIXATION UNDER ANESTHESIA;  Surgeon: Wylene Simmer, MD;  Location: [place];  Service: Orthopedics;  Laterality: Left;  . Orif ankle fracture Left 01/02/2013    Procedure: LEFT ANKLE REMOVAL OF EXTERNAL FIXATOR OPEN REDUCTION INTERNAL FIXATION (ORIF) TRIMALLEOLAR FRACTURE;  Surgeon: Wylene Simmer, MD;  Location: [place];  Service: Orthopedics;  Laterality: Left;    reports  that she has been smoking Cigarettes.  She has been smoking about 0.25 packs per day. She has never used smokeless tobacco. She reports that she does not drink alcohol or use illicit drugs. family history includes Liver cancer in her mother. Allergies  Allergen Reactions  . Naproxen Sodium Anaphylaxis  . Sulfonamide Derivatives Other (See Comments)    Causes nephritis  . Ace Inhibitors Swelling    Lip swelling   Current Outpatient Prescriptions on File Prior to Visit  Medication Sig Dispense Refill  . aspirin EC 325 MG tablet Take 325 mg by mouth daily.    Marland Kitchen atenolol (TENORMIN) 50 MG tablet Take 1 tablet (50 mg total) by mouth daily. 90 tablet 3    . B Complex-C (B-COMPLEX WITH VITAMIN C) tablet Take 1 tablet by mouth daily.    . clonazePAM (KLONOPIN) 1 MG tablet TAKE 1 TABLET BY MOUTH TWICE DAILY AS NEEDED FOR ANXIETY 60 tablet 2  . cyclobenzaprine (FLEXERIL) 10 MG tablet Take 10 mg by mouth 3 (three) times daily.     Marland Kitchen GINKGO BILOBA COMPLEX PO Take 1 tablet by mouth daily.    . Ginseng 500 MG CAPS Take 1 capsule by mouth daily.    . metFORMIN (GLUCOPHAGE) 500 MG tablet Take 2 tablets (1,000 mg total) by mouth daily with breakfast. 180 tablet 3  . Multiple Vitamins-Minerals (CENTRUM SILVER PO) Take 1 tablet by mouth daily.      Marland Kitchen oxycodone (ROXICODONE) 30 MG immediate release tablet 30 mg 5 times daily as needed for pain. 20 tablet 0  . pravastatin (PRAVACHOL) 40 MG tablet TAKE 2 TABLETS BY MOUTH DAILY 90 tablet 1  . sertraline (ZOLOFT) 100 MG tablet Take 2 tablets (200 mg total) by mouth daily. 30 tablet 0   No current facility-administered medications on file prior to visit.    Review of Systems  Constitutional: Negative for unusual diaphoresis or night sweats HENT: Negative for ear swelling or discharge Eyes: Negative for worsening visual haziness  Respiratory: Negative for choking and stridor.   Gastrointestinal: Negative for distension or worsening eructation Genitourinary: Negative for retention or change in urine volume.  Musculoskeletal: Negative for other MSK pain or swelling Skin: Negative for color change and worsening wound Neurological: Negative for tremors and numbness other than noted  Psychiatric/Behavioral: Negative for decreased concentration or agitation other than above       Objective:   Physical Exam BP 112/60 mmHg  Pulse 69  Temp(Src) 97.9 F (36.6 C) (Oral)  Resp 20  Wt 145 lb (65.772 kg)  SpO2 97% VS noted,  Constitutional: Pt appears in no apparent distress HENT: Head: NCAT.  Right Ear: External ear normal.  Left Ear: External ear normal.  Eyes: . Pupils are equal, round, and reactive to  light. Conjunctivae and EOM are normal Neck: Normal range of motion. Neck supple.  Cardiovascular: Normal rate and regular rhythm.   Pulmonary/Chest: Effort normal and breath sounds without rales or wheezing.  Abd:  Soft, NT, ND, + BS except for tender right costal margin anteriorly and subcostal without guarding or rebound Neurological: Pt is alert. Not confused , motor grossly intact Skin: Skin is warm. No rash, no LE edema Psychiatric: Pt behavior is normal. No agitation.     Assessment & Plan:

## 2015-06-24 NOTE — Telephone Encounter (Signed)
Lab called to inform that pts potassium was at a critical level at 6.2

## 2015-06-24 NOTE — Patient Instructions (Addendum)
Please take all new medication as prescribed - the toviaz for bladder  Please continue all other medications as before, and refills have been done if requested.  Please have the pharmacy call with any other refills you may need.  Please keep your appointments with your specialists as you may have planned  You will be contacted regarding the referral for: Abdomen ultrasound (to see [facility] now_  Please go to the LAB in the Basement (turn left off the elevator) for the tests to be done today  You will be contacted by phone if any changes need to be made immediately.  Otherwise, you will receive a letter about your results with an explanation, but please check with MyChart first.  Please return in 2 wekks, or sooner if needed

## 2015-06-24 NOTE — Progress Notes (Signed)
Pre visit review using our clinic review tool, if applicable. No additional management support is needed unless otherwise documented below in the visit note. 

## 2015-06-24 NOTE — Telephone Encounter (Signed)
Please contact pt  Unfortunately the K is high, and high enough that cannot be dismissed as minor  Sometimes a high K is due to lab handling error  Please ask pt to return to lab asap for repeat BMET as this is the only way to prove the high potassium is not that high  There are no medication changes to make for now, as pt does not take meds to increase the K

## 2015-06-25 ENCOUNTER — Other Ambulatory Visit (INDEPENDENT_AMBULATORY_CARE_PROVIDER_SITE_OTHER): Payer: Commercial Managed Care - HMO

## 2015-06-25 DIAGNOSIS — E875 Hyperkalemia: Secondary | ICD-10-CM | POA: Diagnosis not present

## 2015-06-25 LAB — BASIC METABOLIC PANEL
BUN: 27 mg/dL — ABNORMAL HIGH (ref 6–23)
CALCIUM: 9.8 mg/dL (ref 8.4–10.5)
CO2: 29 meq/L (ref 19–32)
Chloride: 103 mEq/L (ref 96–112)
Creatinine, Ser: 0.89 mg/dL (ref 0.40–1.20)
GFR: 66.83 mL/min (ref >60.00)
GLUCOSE: 157 mg/dL — AB (ref 70–99)
POTASSIUM: 5.4 meq/L — AB (ref 3.5–5.1)
SODIUM: 139 meq/L (ref 135–145)

## 2015-06-25 NOTE — Assessment & Plan Note (Signed)
?   MSK post fall vs GB vs other, for abd u/s, labs as documented,  to f/u any worsening symptoms or concerns

## 2015-06-25 NOTE — Assessment & Plan Note (Signed)
Afeb, exam benign, for UA as above,  to f/u any worsening symptoms or concerns

## 2015-06-25 NOTE — Assessment & Plan Note (Signed)
Likely c/w OAB, will have UA with next labs, ok for trial toviaz,  to f/u any worsening symptoms or concerns

## 2015-06-25 NOTE — Assessment & Plan Note (Signed)
stable overall by history and exam, recent data reviewed with pt, and pt to continue medical treatment as before,  to f/u any worsening symptoms or concerns Lab Results  Component Value Date   HGBA1C 6.6* 06/24/2015

## 2015-06-26 LAB — URINE CULTURE: Colony Count: >=100000

## 2015-06-27 ENCOUNTER — Other Ambulatory Visit: Payer: Self-pay | Admitting: Internal Medicine

## 2015-06-27 MED ORDER — CIPROFLOXACIN HCL 500 MG PO TABS: 500.0000 mg | ORAL_TABLET | Freq: Two times a day (BID) | ORAL | Status: DC

## 2015-06-28 ENCOUNTER — Other Ambulatory Visit: Payer: Self-pay

## 2015-06-28 NOTE — Telephone Encounter (Signed)
Pt called back in, Can you please call her as soon as you get a chance?

## 2015-06-30 ENCOUNTER — Ambulatory Visit
Admission: RE | Admit: 2015-06-30 | Discharge: 2015-06-30 | Disposition: A | Discharge status: Home / Self Care | Payer: Commercial Managed Care - HMO | Source: Ambulatory Visit | Attending: Internal Medicine | Admitting: Internal Medicine

## 2015-06-30 ENCOUNTER — Other Ambulatory Visit: Payer: Self-pay | Admitting: Internal Medicine

## 2015-06-30 DIAGNOSIS — R1011 Right upper quadrant pain: Secondary | ICD-10-CM

## 2015-06-30 DIAGNOSIS — K8020 Calculus of gallbladder without cholecystitis without obstruction: Secondary | ICD-10-CM | POA: Diagnosis not present

## 2015-06-30 DIAGNOSIS — R109 Unspecified abdominal pain: Secondary | ICD-10-CM

## 2015-06-30 DIAGNOSIS — K8050 Calculus of bile duct without cholangitis or cholecystitis without obstruction: Secondary | ICD-10-CM | POA: Insufficient documentation

## 2015-07-05 ENCOUNTER — Other Ambulatory Visit (INDEPENDENT_AMBULATORY_CARE_PROVIDER_SITE_OTHER): Payer: Commercial Managed Care - HMO

## 2015-07-05 ENCOUNTER — Encounter: Payer: Self-pay | Admitting: Gastroenterology

## 2015-07-05 ENCOUNTER — Ambulatory Visit (INDEPENDENT_AMBULATORY_CARE_PROVIDER_SITE_OTHER): Payer: Commercial Managed Care - HMO | Admitting: Gastroenterology

## 2015-07-05 VITALS — BP 82/54 | HR 60 | Ht 67.5 in | Wt 148.0 lb

## 2015-07-05 DIAGNOSIS — R0789 Other chest pain: Secondary | ICD-10-CM

## 2015-07-05 DIAGNOSIS — R935 Abnormal findings on diagnostic imaging of other abdominal regions, including retroperitoneum: Secondary | ICD-10-CM

## 2015-07-05 LAB — HEPATIC FUNCTION PANEL
ALBUMIN: 3.8 g/dL (ref 3.5–5.2)
ALK PHOS: 120 U/L — AB (ref 39–117)
ALT: 20 U/L (ref 0–35)
AST: 12 U/L (ref 0–37)
BILIRUBIN DIRECT: 0.1 mg/dL (ref 0.0–0.3)
TOTAL PROTEIN: 7.3 g/dL (ref 6.0–8.3)
Total Bilirubin: 0.4 mg/dL (ref 0.2–1.2)

## 2015-07-05 NOTE — Patient Instructions (Addendum)
Your physician has requested that you go to the basement for the following lab work before leaving today:LFT's.  You have been scheduled for an MRI/MRCP at [facility] on 07/13/15. Your appointment time is 12:00pm. Please arrive 15 minutes prior to your appointment time for registration purposes. Please make certain not to have anything to eat or drink 6 hours prior to your test. In addition, if you have any metal in your body, have a pacemaker or defibrillator, please be sure to let your ordering physician know. This test typically takes 45 minutes to 1 hour to complete.  Thank you for choosing me and [facility].  Pricilla Riffle. Dagoberto Ligas., MD., [facility] Regal

## 2015-07-05 NOTE — Progress Notes (Addendum)
    History of Present Illness: This is a 70 year old female referred by Biagio Borg, MD for the evaluation of right-sided chest pain and an abnormal abdominal ultrasound. Patient relates the onset of lower right anterior chest pain about 3 weeks ago. The pain has been constant. It is unchanged by meals or bowel movements. Pain has improved with use of oxycodone. Abdominal ultrasound was ordered with findings below. LFTs remarkable for a minimally elevated alkaline phosphatase of 126 and other LFTs were normal. Denies weight loss, abdominal pain, constipation, diarrhea, change in stool caliber, melena, hematochezia, nausea, vomiting, dysphagia, reflux symptoms.  Abd US IMPRESSION, 06/30/15: Choledocholithiasis with suspected 6 mm mid common duct stone. Associated mild intrahepatic/extrahepatic ductal dilatation. Common duct measures 10 mm.  No cholelithiasis or findings to suggest acute cholecystitis, despite a positive sonographic Murphy sign.  Review of Systems: Pertinent positive and negative review of systems were noted in the above HPI section. All other review of systems were otherwise negative.  Current Medications, Allergies, Past Medical History, Past Surgical History, Family History and Social History were reviewed in [employer] record.  Physical Exam: General: Well developed, well nourished, no acute distress Head: Normocephalic and atraumatic Eyes:  sclerae anicteric, EOMI Ears: Normal auditory acuity Mouth: No deformity or lesions Neck: Supple, no masses or thyromegaly Lungs: Clear throughout to auscultation. Chest: Chest wall tenderness over lower right anterior ribs Heart: Regular rate and rhythm; no murmurs, rubs or bruits Abdomen: Soft, non tender and non distended. No masses, hepatosplenomegaly or hernias noted. Normal Bowel sounds Musculoskeletal: Symmetrical with no gross deformities  Skin: No lesions on visible extremities Pulses:  Normal pulses  noted Extremities: No clubbing, cyanosis, edema or deformities noted Neurological: Alert oriented x 4, grossly nonfocal Cervical Nodes:  No significant cervical adenopathy Inguinal Nodes: No significant inguinal adenopathy Psychological:  Alert and cooperative. Normal mood and affect  Assessment and Recommendations:  1. Chest wall pain over lower right anterior ribs. This is likely related to a rib or other chest wall process. Further evaluation and follow-up with Dr. Jenny Reichmann.  2. Abnormal abdominal ultrasound with a suspected common bile duct stone and mild biliary dilatation. Unusual to have CBD stone without gallstones noted. Repeat LFTs and proceed with MRI/MRCP for further evaluation. Pending MRCP findings ERCP might be needed.    cc: Biagio Borg, MD [address], [place] [address]

## 2015-07-08 ENCOUNTER — Ambulatory Visit: Payer: Commercial Managed Care - HMO | Admitting: Internal Medicine

## 2015-07-13 ENCOUNTER — Other Ambulatory Visit: Payer: Self-pay | Admitting: Gastroenterology

## 2015-07-13 ENCOUNTER — Ambulatory Visit (HOSPITAL_COMMUNITY)
Admission: RE | Admit: 2015-07-13 | Discharge: 2015-07-13 | Disposition: A | Discharge status: Home / Self Care | Payer: Commercial Managed Care - HMO | Source: Ambulatory Visit | Attending: Gastroenterology | Admitting: Gastroenterology

## 2015-07-13 DIAGNOSIS — Q445 Other congenital malformations of bile ducts: Secondary | ICD-10-CM | POA: Diagnosis not present

## 2015-07-13 DIAGNOSIS — K838 Other specified diseases of biliary tract: Secondary | ICD-10-CM | POA: Diagnosis not present

## 2015-07-13 DIAGNOSIS — R935 Abnormal findings on diagnostic imaging of other abdominal regions, including retroperitoneum: Secondary | ICD-10-CM

## 2015-07-13 DIAGNOSIS — R0789 Other chest pain: Secondary | ICD-10-CM | POA: Diagnosis not present

## 2015-07-13 DIAGNOSIS — N281 Cyst of kidney, acquired: Secondary | ICD-10-CM | POA: Diagnosis not present

## 2015-07-13 MED ORDER — GADOBENATE DIMEGLUMINE 529 MG/ML IV SOLN
15.0000 mL | Freq: Once | INTRAVENOUS | Status: AC | PRN
Start: 2015-07-13 — End: 2015-07-13
  Administered 2015-07-13: 13 mL via INTRAVENOUS

## 2015-07-15 ENCOUNTER — Telehealth: Payer: Self-pay

## 2015-07-15 ENCOUNTER — Other Ambulatory Visit: Payer: Self-pay

## 2015-07-15 DIAGNOSIS — R932 Abnormal findings on diagnostic imaging of liver and biliary tract: Secondary | ICD-10-CM

## 2015-07-15 NOTE — Telephone Encounter (Signed)
-----   Message from Milus Banister, MD sent at 07/15/2015  7:20 AM EDT ----- Norberto Sorenson, I reviewed your note, looked at [place] and MR.  There really appears to be a shadowing stone in the bile duct on [place].  I think the radiologist may be right in thinking that the stone may have passed and caused some ampullary edema.  I think EUS is a very good next step to see if 1. Small stone(s) still present in bile duct and 2. Is the ampullary finding a mass.  I'd plan to proceed with ERCP if needed.  Can you let her know about the plan and I'll have Rosalynd Mcwright get in touch to get it scheduled.  Thanks  Laniece Hornbaker, See above.  She needs EUS +/- ERCP next available EUS Thursday with anesthesia assistance.  Thanks  DJ   ----- Message -----    From: Ladene Artist, MD    Sent: 07/14/2015   8:29 AM      To: Milus Banister, MD  Linna Hoff,  Would you please review her recent MRCP. Should we proceed with ERCP then EUS or schedule EUS/ERCP together?  Norberto Sorenson

## 2015-07-15 NOTE — Telephone Encounter (Signed)
The pt was given the instructions, date and time but was unable to make the 07/22/15 appt so it was rescheduled to 07/29/15 1230 pm.  She was given the corrected instructions and will call if she cant get a ride for that day.  Instructions are also available in My Chart.

## 2015-07-19 ENCOUNTER — Encounter (HOSPITAL_COMMUNITY): Payer: Self-pay | Admitting: *Deleted

## 2015-07-21 ENCOUNTER — Ambulatory Visit: Payer: Commercial Managed Care - HMO | Admitting: Internal Medicine

## 2015-07-22 DIAGNOSIS — M461 Sacroiliitis, not elsewhere classified: Secondary | ICD-10-CM | POA: Diagnosis not present

## 2015-07-22 DIAGNOSIS — M546 Pain in thoracic spine: Secondary | ICD-10-CM | POA: Diagnosis not present

## 2015-07-22 DIAGNOSIS — M792 Neuralgia and neuritis, unspecified: Secondary | ICD-10-CM | POA: Diagnosis not present

## 2015-07-22 DIAGNOSIS — M791 Myalgia: Secondary | ICD-10-CM | POA: Diagnosis not present

## 2015-07-29 ENCOUNTER — Encounter (HOSPITAL_COMMUNITY)
Admission: RE | Disposition: A | Discharge status: Home / Self Care | Payer: Self-pay | Source: Ambulatory Visit | Attending: Gastroenterology

## 2015-07-29 ENCOUNTER — Ambulatory Visit (HOSPITAL_COMMUNITY): Payer: Commercial Managed Care - HMO | Admitting: Anesthesiology

## 2015-07-29 ENCOUNTER — Ambulatory Visit (HOSPITAL_COMMUNITY)
Admission: RE | Admit: 2015-07-29 | Discharge: 2015-07-29 | Disposition: A | Discharge status: Home / Self Care | Payer: Commercial Managed Care - HMO | Source: Ambulatory Visit | Attending: Gastroenterology | Admitting: Gastroenterology

## 2015-07-29 ENCOUNTER — Encounter (HOSPITAL_COMMUNITY): Payer: Self-pay

## 2015-07-29 DIAGNOSIS — R935 Abnormal findings on diagnostic imaging of other abdominal regions, including retroperitoneum: Secondary | ICD-10-CM | POA: Diagnosis not present

## 2015-07-29 DIAGNOSIS — R0789 Other chest pain: Secondary | ICD-10-CM | POA: Insufficient documentation

## 2015-07-29 DIAGNOSIS — I10 Essential (primary) hypertension: Secondary | ICD-10-CM | POA: Insufficient documentation

## 2015-07-29 DIAGNOSIS — K838 Other specified diseases of biliary tract: Secondary | ICD-10-CM | POA: Diagnosis not present

## 2015-07-29 DIAGNOSIS — Z7984 Long term (current) use of oral hypoglycemic drugs: Secondary | ICD-10-CM | POA: Diagnosis not present

## 2015-07-29 DIAGNOSIS — E119 Type 2 diabetes mellitus without complications: Secondary | ICD-10-CM | POA: Diagnosis not present

## 2015-07-29 DIAGNOSIS — F17200 Nicotine dependence, unspecified, uncomplicated: Secondary | ICD-10-CM | POA: Diagnosis not present

## 2015-07-29 DIAGNOSIS — R932 Abnormal findings on diagnostic imaging of liver and biliary tract: Secondary | ICD-10-CM

## 2015-07-29 HISTORY — PX: EUS: SHX5427

## 2015-07-29 LAB — GLUCOSE, CAPILLARY: GLUCOSE-CAPILLARY: 119 mg/dL — AB (ref 65–99)

## 2015-07-29 SURGERY — UPPER ENDOSCOPIC ULTRASOUND (EUS) LINEAR
Anesthesia: General

## 2015-07-29 MED ORDER — PROPOFOL 10 MG/ML IV BOLUS
INTRAVENOUS | Status: AC
  Filled 2015-07-29: qty 20

## 2015-07-29 MED ORDER — FENTANYL CITRATE (PF) 100 MCG/2ML IJ SOLN
INTRAMUSCULAR | Status: AC
  Filled 2015-07-29: qty 2

## 2015-07-29 MED ORDER — PROPOFOL 500 MG/50ML IV EMUL
INTRAVENOUS | Status: DC | PRN
  Administered 2015-07-29: 130 ug/kg/min via INTRAVENOUS

## 2015-07-29 MED ORDER — LIDOCAINE HCL (CARDIAC) 20 MG/ML IV SOLN
INTRAVENOUS | Status: DC | PRN
  Administered 2015-07-29: 100 mg via INTRAVENOUS

## 2015-07-29 MED ORDER — BUTAMBEN-TETRACAINE-BENZOCAINE 2-2-14 % EX AERO
INHALATION_SPRAY | CUTANEOUS | Status: DC | PRN
  Administered 2015-07-29: 1 via TOPICAL

## 2015-07-29 MED ORDER — ONDANSETRON HCL 4 MG/2ML IJ SOLN: 4.0000 mg | Freq: Once | INTRAMUSCULAR | Status: DC | PRN

## 2015-07-29 MED ORDER — SODIUM CHLORIDE 0.9 % IV SOLN: INTRAVENOUS | Status: DC

## 2015-07-29 MED ORDER — LIDOCAINE HCL (CARDIAC) 20 MG/ML IV SOLN
INTRAVENOUS | Status: AC
  Filled 2015-07-29: qty 5

## 2015-07-29 MED ORDER — INDOMETHACIN 50 MG RE SUPP
RECTAL | Status: AC
  Filled 2015-07-29: qty 2

## 2015-07-29 MED ORDER — GLUCAGON HCL RDNA (DIAGNOSTIC) 1 MG IJ SOLR
INTRAMUSCULAR | Status: AC
  Filled 2015-07-29: qty 2

## 2015-07-29 MED ORDER — PROPOFOL 10 MG/ML IV BOLUS
INTRAVENOUS | Status: DC | PRN
  Administered 2015-07-29 (×2): 20 mg via INTRAVENOUS

## 2015-07-29 MED ORDER — FENTANYL CITRATE (PF) 100 MCG/2ML IJ SOLN: 25.0000 ug | INTRAMUSCULAR | Status: DC | PRN

## 2015-07-29 MED ORDER — ONDANSETRON HCL 4 MG/2ML IJ SOLN
INTRAMUSCULAR | Status: AC
  Filled 2015-07-29: qty 2

## 2015-07-29 MED ORDER — LACTATED RINGERS IV SOLN: INTRAVENOUS | Status: DC

## 2015-07-29 NOTE — H&P (View-Only) (Signed)
    History of Present Illness: This is a 70 year old female referred by Biagio Borg, MD for the evaluation of right-sided chest pain and an abnormal abdominal ultrasound. Patient relates the onset of lower right anterior chest pain about 3 weeks ago. The pain has been constant. It is unchanged by meals or bowel movements. Pain has improved with use of oxycodone. Abdominal ultrasound was ordered with findings below. LFTs remarkable for a minimally elevated alkaline phosphatase of 126 and other LFTs were normal. Denies weight loss, abdominal pain, constipation, diarrhea, change in stool caliber, melena, hematochezia, nausea, vomiting, dysphagia, reflux symptoms.  Abd US IMPRESSION, 06/30/15: Choledocholithiasis with suspected 6 mm mid common duct stone. Associated mild intrahepatic/extrahepatic ductal dilatation. Common duct measures 10 mm.  No cholelithiasis or findings to suggest acute cholecystitis, despite a positive sonographic Murphy sign.  Review of Systems: Pertinent positive and negative review of systems were noted in the above HPI section. All other review of systems were otherwise negative.  Current Medications, Allergies, Past Medical History, Past Surgical History, Family History and Social History were reviewed in [employer] record.  Physical Exam: General: Well developed, well nourished, no acute distress Head: Normocephalic and atraumatic Eyes:  sclerae anicteric, EOMI Ears: Normal auditory acuity Mouth: No deformity or lesions Neck: Supple, no masses or thyromegaly Lungs: Clear throughout to auscultation. Chest: Chest wall tenderness over lower right anterior ribs Heart: Regular rate and rhythm; no murmurs, rubs or bruits Abdomen: Soft, non tender and non distended. No masses, hepatosplenomegaly or hernias noted. Normal Bowel sounds Musculoskeletal: Symmetrical with no gross deformities  Skin: No lesions on visible extremities Pulses:  Normal pulses  noted Extremities: No clubbing, cyanosis, edema or deformities noted Neurological: Alert oriented x 4, grossly nonfocal Cervical Nodes:  No significant cervical adenopathy Inguinal Nodes: No significant inguinal adenopathy Psychological:  Alert and cooperative. Normal mood and affect  Assessment and Recommendations:  1. Chest wall pain over lower right anterior ribs. This is likely related to a rib or other chest wall process. Further evaluation and follow-up with Dr. Jenny Reichmann.  2. Abnormal abdominal ultrasound with a suspected common bile duct stone and mild biliary dilatation. Unusual to have CBD stone without gallstones noted. Repeat LFTs and proceed with MRI/MRCP for further evaluation. Pending MRCP findings ERCP might be needed.    cc: Biagio Borg, MD [address], [place] [address]

## 2015-07-29 NOTE — Transfer of Care (Signed)
Immediate Anesthesia Transfer of Care Note  Patient: Crystal Thornton  Procedure(s) Performed: Procedure(s): UPPER ENDOSCOPIC ULTRASOUND (EUS) LINEAR (N/A) ENDOSCOPIC RETROGRADE CHOLANGIOPANCREATOGRAPHY (ERCP) (N/A)  Patient Location: PACU  Anesthesia Type:MAC  Level of Consciousness:  sedated, patient cooperative and responds to stimulation  Airway & Oxygen Therapy:Patient Spontanous Breathing and Patient connected to face mask oxgen  Post-op Assessment:  Report given to PACU RN and Post -op Vital signs reviewed and stable  Post vital signs:  Reviewed and stable  Last Vitals:  Filed Vitals:   07/29/15 1228 07/29/15 1229  BP:  83/54  Pulse: 53   Temp: 36.4 C   Resp: 14     Complications: No apparent anesthesia complications

## 2015-07-29 NOTE — Anesthesia Preprocedure Evaluation (Addendum)
Anesthesia Evaluation  Patient identified by MRN, date of birth, ID band Patient awake    Reviewed: Allergy & Precautions, NPO status , Patient's Chart, lab work & pertinent test results  Airway Mallampati: III  TM Distance: >3 FB Neck ROM: Limited   Comment: Can not extend neck due to cervical fusion history Dental  (+) Edentulous Upper, Poor Dentition   Pulmonary Current Smoker,    breath sounds clear to auscultation       Cardiovascular hypertension, Pt. on medications  Rhythm:Regular Rate:Normal     Neuro/Psych PSYCHIATRIC DISORDERS Anxiety Depression    GI/Hepatic (+) Hepatitis -, B, C  Endo/Other  diabetes, Type 2, Oral Hypoglycemic Agents  Renal/GU      Musculoskeletal   Abdominal   Peds  Hematology   Anesthesia Other Findings   Reproductive/Obstetrics                           Anesthesia Physical  Anesthesia Plan  ASA: III  Anesthesia Plan: General and MAC   Post-op Pain Management:    Induction: Intravenous  Airway Management Planned: Oral ETT and Nasal Cannula  Additional Equipment:   Intra-op Plan:   Post-operative Plan: Extubation in OR  Informed Consent: I have reviewed the patients History and Physical, chart, labs and discussed the procedure including the risks, benefits and alternatives for the proposed anesthesia with the patient or authorized representative who has indicated his/her understanding and acceptance.   Dental advisory given  Plan Discussed with: CRNA and Anesthesiologist  Anesthesia Plan Comments: (Will perform Mac first, then GA if needed for ERCP portion)       Anesthesia Quick Evaluation

## 2015-07-29 NOTE — Anesthesia Postprocedure Evaluation (Signed)
Anesthesia Post Note  Patient: Crystal Thornton  Procedure(s) Performed: Procedure(s) (LRB): UPPER ENDOSCOPIC ULTRASOUND (EUS) LINEAR (N/A) ENDOSCOPIC RETROGRADE CHOLANGIOPANCREATOGRAPHY (ERCP) (N/A)  Patient location during evaluation: PACU Anesthesia Type: MAC Level of consciousness: awake and alert Pain management: pain level controlled Vital Signs Assessment: post-procedure vital signs reviewed and stable Respiratory status: spontaneous breathing, nonlabored ventilation, respiratory function stable and patient connected to nasal cannula oxygen Cardiovascular status: stable and blood pressure returned to baseline Anesthetic complications: no    Last Vitals:  Filed Vitals:   07/29/15 1237 07/29/15 1247  BP: 96/60 163/83  Pulse: 51 54  Temp:    Resp: 17 16    Last Pain: There were no vitals filed for this visit.               Zenaida Deed

## 2015-07-29 NOTE — Op Note (Signed)
[facility] Patient Name: Crystal Thornton Procedure Date: 07/29/2015 MRN: [id number] Attending MD: Milus Banister , MD Date of Birth: [birth year] CSN: [id number] Age: 70 Admit Type: Outpatient Procedure:                Upper EUS Indications:              Abnormal ultrasound of the abdomen (dilated CBD                            with stone present); MRI, MRCP showed dilated CBD,                            dilated PD, edematous ampullary region, no stone in                            CBD; normal LFTs; no weight loss Providers:                Milus Banister, MD, Alinda Deem, RN, Hilma Favors, RN, Corliss Parish, Technician Referring MD:             Lucio Edward, MD Medicines:                Monitored Anesthesia Care Complications:            No immediate complications. Estimated blood loss:                            None. Estimated Blood Loss:     Estimated blood loss: none. Procedure:                Pre-Anesthesia Assessment:                           - Prior to the procedure, a History and Physical                            was performed, and patient medications and                            allergies were reviewed. The patient's tolerance of                            previous anesthesia was also reviewed. The risks                            and benefits of the procedure and the sedation                            options and risks were discussed with the patient.                            All questions were answered, and informed consent  was obtained. Prior Anticoagulants: The patient has                            taken no previous anticoagulant or antiplatelet                            agents. ASA Grade Assessment: II - A patient with                            mild systemic disease. After reviewing the risks                            and benefits, the patient was deemed in   satisfactory condition to undergo the procedure.                           After obtaining informed consent, the endoscope was                            passed under direct vision. Throughout the                            procedure, the patient's blood pressure, pulse, and                            oxygen saturations were monitored continuously. The                            [id number]) scope was introduced through                            the mouth, and advanced to the second part of                            duodenum. The [id number]) scope was                            introduced through the mouth, and advanced to the                            second part of duodenum. The upper EUS was                            accomplished without difficulty. The patient                            tolerated the procedure well. Scope In: Scope Out: Findings:      Endoscopic Finding :      The UGI tract was normal, including good side views of normal appearing       major papilla      Endosonographic Finding :      1. The extrahepatic biliary tree was dilated (CBD 86mm) diffusely. There       were no stones in the bile duct. Good views of very distal  bile duct       showed that it tapered smoothly into the ampulla and there was no clear       mass, soft tissue in or around the bile duct.      2. The main pancreatic duct was slightly dilated throughout, measuring       49mm in body. Good views of the PD insertion into the ampulla showed no       surrounding mass.      3. Pancreatic parenchyma was atrophic throughout the gland but there       were no discrete masses and no signs of chronic pancreatitis.      4. No peripancreatic adenopathy.      5. Gallbladder was normal      6. Limited views of liver, spleen, portal and splenic vessels were all       normal Impression:               The major papilla was normal on good side viewing.                            The CBD and main  pancreatic duct were dilated (CBD                            32mm, PD 90mm in body) but there were no ampullary,                            periampullary or pancreatic masses seen. No                            choledocholithiasis. Given normal LFTs, I elected                            not to proceed with ERCP. Moderate Sedation:      N/A- Per Anesthesia Care Recommendation:           - Discharge patient to home.                           - Will need to follow LFTs serially. Procedure Code(s):        --- Professional ---                           [phone], Esophagogastroduodenoscopy, flexible,                            transoral; with endoscopic ultrasound examination                            limited to the esophagus, stomach or duodenum, and                            adjacent structures Diagnosis Code(s):        --- Professional ---                           K83.8, Other specified diseases of biliary tract  R93.5, Abnormal findings on diagnostic imaging of                            other abdominal regions, including retroperitoneum CPT copyright 2016 American [employer] Association. All rights reserved. The codes documented in this report are preliminary and upon coder review may  be revised to meet current compliance requirements. Milus Banister, MD 07/29/2015 12:33:58 PM This report has been signed electronically. Number of Addenda: 0

## 2015-07-29 NOTE — Discharge Instructions (Signed)

## 2015-07-29 NOTE — Interval H&P Note (Signed)
History and Physical Interval Note:  07/29/2015 11:31 AM  Crystal Thornton  has presented today for surgery, with the diagnosis of bile duct stone  The various methods of treatment have been discussed with the patient and family. After consideration of risks, benefits and other options for treatment, the patient has consented to  Procedure(s): UPPER ENDOSCOPIC ULTRASOUND (EUS) LINEAR (N/A) ENDOSCOPIC RETROGRADE CHOLANGIOPANCREATOGRAPHY (ERCP) (N/A) as a surgical intervention .  The patient's history has been reviewed, patient examined, no change in status, stable for surgery.  I have reviewed the patient's chart and labs.  Questions were answered to the patient's satisfaction.     Milus Banister

## 2015-07-30 ENCOUNTER — Telehealth: Payer: Self-pay

## 2015-07-30 DIAGNOSIS — R945 Abnormal results of liver function studies: Principal | ICD-10-CM

## 2015-07-30 DIAGNOSIS — R7989 Other specified abnormal findings of blood chemistry: Secondary | ICD-10-CM

## 2015-07-30 NOTE — Telephone Encounter (Signed)
-----   Message from Ladene Artist, MD sent at 07/30/2015 11:32 AM EDT ----- Thanks Linna Hoff. Crystal Thornton: LFTs q 2 months for 6 months and REV in 6 months.    ----- Message -----    From: Milus Banister, MD    Sent: 07/29/2015  12:36 PM      To: Ladene Artist, MD   Norberto Sorenson, Just completed EUS. Full report in EPIC.  The major papilla was normal on good side viewing.  The CBD and main pancreatic duct were dilated (CBD  5mm, PD 16mm in body) but there were no ampullary,  periampullary or pancreatic masses seen. No choledocholithiasis. Given normal LFTs, I elected  not to proceed with ERCP.    If there is a tumor, it is very small.  I really think I got good looks at pancreas, ampullary, periampulla.  I recommend following her with serial liver tests.   Thanks  DJ

## 2015-08-01 ENCOUNTER — Encounter (HOSPITAL_COMMUNITY): Payer: Self-pay | Admitting: Gastroenterology

## 2015-08-05 DIAGNOSIS — M792 Neuralgia and neuritis, unspecified: Secondary | ICD-10-CM | POA: Diagnosis not present

## 2015-08-05 DIAGNOSIS — M461 Sacroiliitis, not elsewhere classified: Secondary | ICD-10-CM | POA: Diagnosis not present

## 2015-08-05 DIAGNOSIS — M546 Pain in thoracic spine: Secondary | ICD-10-CM | POA: Diagnosis not present

## 2015-08-05 DIAGNOSIS — M791 Myalgia: Secondary | ICD-10-CM | POA: Diagnosis not present

## 2015-08-12 ENCOUNTER — Ambulatory Visit: Payer: Self-pay | Admitting: Internal Medicine

## 2015-08-26 DIAGNOSIS — M546 Pain in thoracic spine: Secondary | ICD-10-CM | POA: Diagnosis not present

## 2015-08-26 DIAGNOSIS — M792 Neuralgia and neuritis, unspecified: Secondary | ICD-10-CM | POA: Diagnosis not present

## 2015-08-26 DIAGNOSIS — M791 Myalgia: Secondary | ICD-10-CM | POA: Diagnosis not present

## 2015-08-26 DIAGNOSIS — M461 Sacroiliitis, not elsewhere classified: Secondary | ICD-10-CM | POA: Diagnosis not present

## 2015-09-14 ENCOUNTER — Other Ambulatory Visit: Payer: Self-pay | Admitting: Internal Medicine

## 2015-09-30 DIAGNOSIS — M792 Neuralgia and neuritis, unspecified: Secondary | ICD-10-CM | POA: Diagnosis not present

## 2015-09-30 DIAGNOSIS — M546 Pain in thoracic spine: Secondary | ICD-10-CM | POA: Diagnosis not present

## 2015-09-30 DIAGNOSIS — M461 Sacroiliitis, not elsewhere classified: Secondary | ICD-10-CM | POA: Diagnosis not present

## 2015-09-30 DIAGNOSIS — M791 Myalgia: Secondary | ICD-10-CM | POA: Diagnosis not present

## 2015-10-06 ENCOUNTER — Other Ambulatory Visit: Payer: Self-pay | Admitting: Internal Medicine

## 2015-10-25 ENCOUNTER — Telehealth: Payer: Self-pay | Admitting: *Deleted

## 2015-10-25 MED ORDER — CLONAZEPAM 1 MG PO TABS: 1.0000 mg | ORAL_TABLET | Freq: Two times a day (BID) | ORAL | 2 refills | Status: DC | PRN

## 2015-10-25 NOTE — Telephone Encounter (Signed)
Pt left msg on triage requesting refill on her clonazepam.../lmb

## 2015-10-25 NOTE — Telephone Encounter (Signed)
Done hardcopy to Corinne  

## 2015-10-26 NOTE — Telephone Encounter (Signed)
Medication faxed to pharmacy 

## 2015-10-29 DIAGNOSIS — M461 Sacroiliitis, not elsewhere classified: Secondary | ICD-10-CM | POA: Diagnosis not present

## 2015-10-29 DIAGNOSIS — M546 Pain in thoracic spine: Secondary | ICD-10-CM | POA: Diagnosis not present

## 2015-10-29 DIAGNOSIS — M791 Myalgia: Secondary | ICD-10-CM | POA: Diagnosis not present

## 2015-10-29 DIAGNOSIS — M792 Neuralgia and neuritis, unspecified: Secondary | ICD-10-CM | POA: Diagnosis not present

## 2015-11-15 ENCOUNTER — Telehealth: Payer: Self-pay | Admitting: Internal Medicine

## 2015-11-15 ENCOUNTER — Other Ambulatory Visit (INDEPENDENT_AMBULATORY_CARE_PROVIDER_SITE_OTHER): Payer: Commercial Managed Care - HMO

## 2015-11-15 DIAGNOSIS — E119 Type 2 diabetes mellitus without complications: Secondary | ICD-10-CM

## 2015-11-15 DIAGNOSIS — Z0001 Encounter for general adult medical examination with abnormal findings: Secondary | ICD-10-CM

## 2015-11-15 DIAGNOSIS — R7989 Other specified abnormal findings of blood chemistry: Secondary | ICD-10-CM | POA: Diagnosis not present

## 2015-11-15 DIAGNOSIS — R945 Abnormal results of liver function studies: Principal | ICD-10-CM

## 2015-11-15 LAB — HEPATIC FUNCTION PANEL
ALBUMIN: 4.4 g/dL (ref 3.5–5.2)
ALT: 14 U/L (ref 0–35)
AST: 16 U/L (ref 0–37)
Alkaline Phosphatase: 117 U/L (ref 39–117)
Bilirubin, Direct: 0.1 mg/dL (ref 0.0–0.3)
TOTAL PROTEIN: 7.8 g/dL (ref 6.0–8.3)
Total Bilirubin: 0.4 mg/dL (ref 0.2–1.2)

## 2015-11-15 NOTE — Telephone Encounter (Signed)
Pt came by office requesting lab orders be put in for her CPE that is scheduled on 11/25/15.  Please call pt once orders are entered so she can come have blood work done prior to her CPE.

## 2015-11-15 NOTE — Telephone Encounter (Signed)
Lab orders done. 

## 2015-11-17 ENCOUNTER — Other Ambulatory Visit (INDEPENDENT_AMBULATORY_CARE_PROVIDER_SITE_OTHER): Payer: Commercial Managed Care - HMO

## 2015-11-17 DIAGNOSIS — R6889 Other general symptoms and signs: Secondary | ICD-10-CM | POA: Diagnosis not present

## 2015-11-17 DIAGNOSIS — R7989 Other specified abnormal findings of blood chemistry: Secondary | ICD-10-CM | POA: Diagnosis not present

## 2015-11-17 DIAGNOSIS — E119 Type 2 diabetes mellitus without complications: Secondary | ICD-10-CM | POA: Diagnosis not present

## 2015-11-17 DIAGNOSIS — Z0001 Encounter for general adult medical examination with abnormal findings: Secondary | ICD-10-CM

## 2015-11-17 LAB — HEPATIC FUNCTION PANEL
ALK PHOS: 111 U/L (ref 39–117)
ALT: 15 U/L (ref 0–35)
AST: 18 U/L (ref 0–37)
Albumin: 4.4 g/dL (ref 3.5–5.2)
BILIRUBIN DIRECT: 0.1 mg/dL (ref 0.0–0.3)
TOTAL PROTEIN: 7.7 g/dL (ref 6.0–8.3)
Total Bilirubin: 0.4 mg/dL (ref 0.2–1.2)

## 2015-11-17 LAB — URINALYSIS, ROUTINE W REFLEX MICROSCOPIC
Bilirubin Urine: NEGATIVE
Hgb urine dipstick: NEGATIVE
KETONES UR: NEGATIVE
Nitrite: NEGATIVE
SPECIFIC GRAVITY, URINE: 1.02 (ref 1.000–1.030)
Total Protein, Urine: NEGATIVE
Urine Glucose: NEGATIVE
Urobilinogen, UA: 0.2 (ref 0.0–1.0)
pH: 6 (ref 5.0–8.0)

## 2015-11-17 LAB — BASIC METABOLIC PANEL
BUN: 18 mg/dL (ref 6–23)
CALCIUM: 9.7 mg/dL (ref 8.4–10.5)
CHLORIDE: 105 meq/L (ref 96–112)
CO2: 31 meq/L (ref 19–32)
CREATININE: 0.89 mg/dL (ref 0.40–1.20)
GFR: 66.75 mL/min (ref >60.00)
GLUCOSE: 128 mg/dL — AB (ref 70–99)
Potassium: 5.5 mEq/L — ABNORMAL HIGH (ref 3.5–5.1)
SODIUM: 143 meq/L (ref 135–145)

## 2015-11-17 LAB — CBC WITH DIFFERENTIAL/PLATELET
BASOS ABS: 0 10*3/uL (ref 0.0–0.1)
Basophils Relative: 0 % (ref 0.0–3.0)
EOS ABS: 0.2 10*3/uL (ref 0.0–0.7)
Eosinophils Relative: 2 % (ref 0.0–5.0)
HCT: 42.9 % (ref 36.0–46.0)
Hemoglobin: 14.8 g/dL (ref 12.0–15.0)
LYMPHS ABS: 1.7 10*3/uL (ref 0.7–4.0)
Lymphocytes Relative: 18.6 % (ref 12.0–46.0)
MCHC: 34.4 g/dL (ref 30.0–36.0)
MCV: 90.5 fl (ref 78.0–100.0)
Monocytes Absolute: 0.6 10*3/uL (ref 0.1–1.0)
Monocytes Relative: 7 % (ref 3.0–12.0)
NEUTROS ABS: 6.6 10*3/uL (ref 1.4–7.7)
Neutrophils Relative %: 72.4 % (ref 43.0–77.0)
PLATELETS: 345 10*3/uL (ref 150.0–400.0)
RBC: 4.74 Mil/uL (ref 3.87–5.11)
RDW: 14.4 % (ref 11.5–15.5)
WBC: 9.1 10*3/uL (ref 4.0–10.5)

## 2015-11-17 LAB — LIPID PANEL
Cholesterol: 186 mg/dL (ref 0–200)
HDL: 53.8 mg/dL (ref >39.00)
NONHDL: 131.77
TRIGLYCERIDES: 204 mg/dL — AB (ref 0.0–149.0)
Total CHOL/HDL Ratio: 3
VLDL: 40.8 mg/dL — ABNORMAL HIGH (ref 0.0–40.0)

## 2015-11-17 LAB — LDL CHOLESTEROL, DIRECT: LDL DIRECT: 103 mg/dL

## 2015-11-17 LAB — HEMOGLOBIN A1C: HEMOGLOBIN A1C: 6.7 % — AB (ref 4.6–6.5)

## 2015-11-17 LAB — TSH: TSH: 1.6 u[IU]/mL (ref 0.35–4.50)

## 2015-11-17 LAB — MICROALBUMIN / CREATININE URINE RATIO
CREATININE, U: 145 mg/dL
MICROALB/CREAT RATIO: 1.1 mg/g (ref 0.0–30.0)
Microalb, Ur: 1.6 mg/dL (ref 0.0–1.9)

## 2015-11-22 ENCOUNTER — Other Ambulatory Visit: Payer: Self-pay | Admitting: Internal Medicine

## 2015-11-22 DIAGNOSIS — Z1231 Encounter for screening mammogram for malignant neoplasm of breast: Secondary | ICD-10-CM

## 2015-11-24 ENCOUNTER — Ambulatory Visit (INDEPENDENT_AMBULATORY_CARE_PROVIDER_SITE_OTHER): Payer: Commercial Managed Care - HMO | Admitting: Internal Medicine

## 2015-11-24 ENCOUNTER — Encounter: Payer: Self-pay | Admitting: Internal Medicine

## 2015-11-24 VITALS — BP 144/82 | HR 95 | Temp 98.5°F | Resp 20 | Wt 157.0 lb

## 2015-11-24 DIAGNOSIS — Z23 Encounter for immunization: Secondary | ICD-10-CM | POA: Diagnosis not present

## 2015-11-24 DIAGNOSIS — E785 Hyperlipidemia, unspecified: Secondary | ICD-10-CM

## 2015-11-24 DIAGNOSIS — R6889 Other general symptoms and signs: Secondary | ICD-10-CM

## 2015-11-24 DIAGNOSIS — F329 Major depressive disorder, single episode, unspecified: Secondary | ICD-10-CM

## 2015-11-24 DIAGNOSIS — I10 Essential (primary) hypertension: Secondary | ICD-10-CM | POA: Diagnosis not present

## 2015-11-24 DIAGNOSIS — E119 Type 2 diabetes mellitus without complications: Secondary | ICD-10-CM

## 2015-11-24 DIAGNOSIS — Z0000 Encounter for general adult medical examination without abnormal findings: Secondary | ICD-10-CM

## 2015-11-24 DIAGNOSIS — F32A Depression, unspecified: Secondary | ICD-10-CM

## 2015-11-24 DIAGNOSIS — Z0001 Encounter for general adult medical examination with abnormal findings: Secondary | ICD-10-CM

## 2015-11-24 MED ORDER — ROSUVASTATIN CALCIUM 40 MG PO TABS
40.0000 mg | ORAL_TABLET | Freq: Every day | ORAL | 3 refills | Status: DC
Start: 2015-11-24 — End: 2016-11-19

## 2015-11-24 MED ORDER — METFORMIN HCL 500 MG PO TABS: ORAL_TABLET | ORAL | 3 refills | Status: DC

## 2015-11-24 MED ORDER — ATENOLOL 50 MG PO TABS: 50.0000 mg | ORAL_TABLET | Freq: Every day | ORAL | 3 refills | Status: DC

## 2015-11-24 MED ORDER — SERTRALINE HCL 100 MG PO TABS: 200.0000 mg | ORAL_TABLET | Freq: Every day | ORAL | 3 refills | Status: DC

## 2015-11-24 MED ORDER — CLONAZEPAM 1 MG PO TABS: 1.0000 mg | ORAL_TABLET | Freq: Two times a day (BID) | ORAL | 2 refills | Status: DC | PRN

## 2015-11-24 MED ORDER — FESOTERODINE FUMARATE ER 4 MG PO TB24: 4.0000 mg | ORAL_TABLET | Freq: Every day | ORAL | 3 refills | Status: DC

## 2015-11-24 NOTE — Progress Notes (Signed)
Pre visit review using our clinic review tool, if applicable. No additional management support is needed unless otherwise documented below in the visit note. 

## 2015-11-24 NOTE — Patient Instructions (Addendum)
You had the flu shot today, and Tetanus shot today  OK to change the statin to crestor 40 mg per day  Please continue all other medications as before, and refills have been done if requested.  Please have the pharmacy call with any other refills you may need.  Please continue your efforts at being more active, low cholesterol diet, and weight control.  You are otherwise up to date with prevention measures today.  Please keep your appointments with your specialists as you may have planned  Please return in 6 months, or sooner if needed, with Lab testing done 3-5 days before

## 2015-11-24 NOTE — Progress Notes (Signed)
Subjective:    Patient ID: Crystal Thornton, female    DOB: December 28, 1945, 70 y.o.   MRN: [id number]  HPI Here for wellness and f/u;  Overall doing ok;  Pt denies Chest pain, worsening SOB, DOE, wheezing, orthopnea, PND, worsening LE edema, palpitations, dizziness or syncope.  Pt denies neurological change such as new headache, facial or extremity weakness.  Pt denies polydipsia, polyuria, or low sugar symptoms. Pt states overall good compliance with treatment and medications, good tolerability, and has been trying to follow appropriate diet.  Pt denies worsening depressive symptoms, suicidal ideation or panic. No fever, night sweats, wt loss, loss of appetite, or other constitutional symptoms.  Pt states good ability with ADL's, has low fall risk, home safety reviewed and adequate, no other significant changes in hearing or vision, and only occasionally active with exercise.   Due for mammogram next wk.  Does also have a left upper hematoma after fall a few days ago, after got wrapped up in dog leash, also leading to an abrasion above the right ankle, also has a small nip dog bite with her puppy small dog yesterday with some bleeding but none today and no s/s infection or drainage.   Past Medical History:  Diagnosis Date  . ANGIOEDEMA 01/24/2010  . Anxiety   . ANXIETY 11/07/2006  . BACK PAIN 11/01/2007  . Cough 05/29/2008  . CYST, OVARIAN NEC/NOS 11/05/2006  . Depression   . DEPRESSION 11/07/2006  . Diabetes mellitus   . DIABETES MELLITUS, TYPE II 11/07/2006  . DIVERTICULOSIS, COLON 11/07/2006  . FIBROCYSTIC BREAST DISEASE 11/05/2006  . HEAD TRAUMA, CLOSED 01/24/2010  . HEPATITIS B, HX OF 11/05/2006  . Hepatitis C    Treated and no longer has  . HEPATITIS C 11/05/2006  . Hyperlipidemia   . HYPERLIPIDEMIA 11/07/2006  . Hypertension   . HYPERTENSION 05/29/2007  . INSOMNIA, HX OF 11/05/2006  . LOW BACK PAIN 05/29/2007  . Osteopenia   . OSTEOPENIA 11/07/2006  . Other specified sites of sprains and strains  08/31/2008  . Pain    R. shoulder and lower back and legs  . PULMONARY NODULE, LEFT UPPER LOBE 05/31/2009  . Seizures ([place])   . SHOULDER PAIN, RIGHT, CHRONIC 06/02/2010  . SINUSITIS- ACUTE-NOS 12/10/2007  . SKIN LESION 06/02/2010  . TOTAL ABDOMINAL HYSTERECTOMY, HX OF 03/27/1990  . Wears dentures    full top  . Wears glasses    Past Surgical History:  Procedure Laterality Date  . APPENDECTOMY    . CERVICAL FUSION  2013  . CESAREAN SECTION    . COLONOSCOPY    . EUS N/A 07/29/2015   Procedure: UPPER ENDOSCOPIC ULTRASOUND (EUS) LINEAR;  Surgeon: Milus Banister, MD;  Location: [facility];  Service: Endoscopy;  Laterality: N/A;  . EXTERNAL FIXATION LEG Left 12/12/2012   Procedure: LEFT ADJUSTMENT OF EXTERNAL FIXATION UNDER ANESTHESIA;  Surgeon: Wylene Simmer, MD;  Location: [facility];  Service: Orthopedics;  Laterality: Left;  . LIVER BIOPSY    . ORIF ANKLE FRACTURE Left 12/03/2012  . ORIF ANKLE FRACTURE Left 01/02/2013   Procedure: LEFT ANKLE REMOVAL OF EXTERNAL FIXATOR OPEN REDUCTION INTERNAL FIXATION (ORIF) TRIMALLEOLAR FRACTURE;  Surgeon: Wylene Simmer, MD;  Location: [place];  Service: Orthopedics;  Laterality: Left;  . OVARIAN CYST SURGERY     ruptured  . SHOULDER ARTHROSCOPY  2008   left  . TUBAL LIGATION      reports that she has been smoking Cigarettes.  She has  been smoking about 0.25 packs per day. She has never used smokeless tobacco. She reports that she does not drink alcohol or use drugs. family history includes Liver cancer in her mother. Allergies  Allergen Reactions  . Naproxen Sodium Anaphylaxis  . Sulfonamide Derivatives Other (See Comments)    Causes nephritis  . Ace Inhibitors Swelling    Lip swelling   Current Outpatient Prescriptions on File Prior to Visit  Medication Sig Dispense Refill  . aspirin EC 325 MG tablet Take 325 mg by mouth daily.    . B Complex-C (B-COMPLEX WITH VITAMIN C) tablet Take 1 tablet by mouth daily.    .  cyclobenzaprine (FLEXERIL) 10 MG tablet Take 10 mg by mouth 3 (three) times daily as needed for muscle spasms.     Marland Kitchen GINKGO BILOBA COMPLEX PO Take 1 tablet by mouth daily.    . Ginseng 500 MG CAPS Take 1 capsule by mouth daily.    . Multiple Vitamins-Minerals (CENTRUM SILVER PO) Take 1 tablet by mouth daily.      Marland Kitchen oxycodone (ROXICODONE) 30 MG immediate release tablet 30 mg 5 times daily as needed for pain. (Patient taking differently: Take 30 mg by mouth See admin instructions. 30mg  up to 5 times daily as needed for pain.) 20 tablet 0   No current facility-administered medications on file prior to visit.    Review of Systems Constitutional: Negative for increased diaphoresis, or other activity, appetite or siginficant weight change other than noted HENT: Negative for worsening hearing loss, ear pain, facial swelling, mouth sores and neck stiffness.   Eyes: Negative for other worsening pain, redness or visual disturbance.  Respiratory: Negative for choking or stridor Cardiovascular: Negative for other chest pain and palpitations.  Gastrointestinal: Negative for worsening diarrhea, blood in stool, or abdominal distention Genitourinary: Negative for hematuria, flank pain or change in urine volume.  Musculoskeletal: Negative for myalgias or other joint complaints.  Skin: Negative for other color change and wound or drainage.  Neurological: Negative for syncope and numbness. other than noted Hematological: Negative for adenopathy. or other swelling Psychiatric/Behavioral: Negative for hallucinations, SI, self-injury, decreased concentration or other worsening agitation.      Objective:   Physical Exam BP (!) 144/82   Pulse 95   Temp 98.5 F (36.9 C) (Oral)   Resp 20   Wt 157 lb (71.2 kg)   SpO2 95%   BMI 24.59 kg/m  VS noted,  Constitutional: Pt is oriented to person, place, and time. Appears well-developed and well-nourished, in no significant distress Head: Normocephalic and  atraumatic  Eyes: Conjunctivae and EOM are normal. Pupils are equal, round, and reactive to light Right Ear: External ear normal.  Left Ear: External ear normal Nose: Nose normal.  Mouth/Throat: Oropharynx is clear and moist  Neck: Normal range of motion. Neck supple. No JVD present. No tracheal deviation present or significant neck LA or mass Cardiovascular: Normal rate, regular rhythm, normal heart sounds and intact distal pulses.   Pulmonary/Chest: Effort normal and breath sounds without rales or wheezing  Abdominal: Soft. Bowel sounds are normal. NT. No HSM  Musculoskeletal: Normal range of motion. Exhibits no edema Lymphadenopathy: Has no cervical adenopathy.  Neurological: Pt is alert and oriented to person, place, and time. Pt has normal reflexes. No cranial nerve deficit. Motor grossly intact Skin: Skin is warm and dry. No rash noted or new ulcers, but has abrasion above right ankle, small dog bite skin loss < 1/4 cm without redness/swelling or drainage or red  streak, also left upper lateral humerous < 1 cm bruising noted Psychiatric:  Has normal mood and affect. Behavior is normal.   MRI ABDOMEN WITHOUT AND WITH CONTRAST (INCLUDING MRCP) 07-13-15 IMPRESSION: 1. No evidence of choledocholithiasis. 2. Biliary and pancreatic duct dilatation, followed to the level of the ampulla. An area of soft tissue fullness at the ampulla is suspicious for an ampullary lesion. Ampullary edema secondary to recent stone passage or an ampullary stenosis could look similar. ERCP should be considered    Assessment & Plan:

## 2015-11-26 DIAGNOSIS — M47812 Spondylosis without myelopathy or radiculopathy, cervical region: Secondary | ICD-10-CM | POA: Diagnosis not present

## 2015-11-26 DIAGNOSIS — M546 Pain in thoracic spine: Secondary | ICD-10-CM | POA: Diagnosis not present

## 2015-11-26 DIAGNOSIS — M792 Neuralgia and neuritis, unspecified: Secondary | ICD-10-CM | POA: Diagnosis not present

## 2015-11-26 DIAGNOSIS — M961 Postlaminectomy syndrome, not elsewhere classified: Secondary | ICD-10-CM | POA: Diagnosis not present

## 2015-11-26 DIAGNOSIS — M791 Myalgia: Secondary | ICD-10-CM | POA: Diagnosis not present

## 2015-11-26 DIAGNOSIS — M461 Sacroiliitis, not elsewhere classified: Secondary | ICD-10-CM | POA: Diagnosis not present

## 2015-11-28 NOTE — Assessment & Plan Note (Signed)

## 2015-11-28 NOTE — Assessment & Plan Note (Addendum)
Uncontrolled, goal ldl < 70, for change to crestor 40 qd, low chol diet,  Lab Results  Component Value Date   LDLCALC 88 06/24/2015   In addition to the time spent performing CPE, I spent an additional 25 minutes face to face,in which greater than 50% of this time was spent in counseling and coordination of care for patient's acute illness as documented.

## 2015-11-28 NOTE — Assessment & Plan Note (Signed)
stable overall by history and exam, recent data reviewed with pt, and pt to continue medical treatment as before,  to f/u any worsening symptoms or concerns Lab Results  Component Value Date   WBC 9.1 11/17/2015   HGB 14.8 11/17/2015   HCT 42.9 11/17/2015   PLT 345.0 11/17/2015   GLUCOSE 128 (H) 11/17/2015   CHOL 186 11/17/2015   TRIG 204.0 (H) 11/17/2015   HDL 53.80 11/17/2015   LDLDIRECT 103.0 11/17/2015   LDLCALC 88 06/24/2015   ALT 15 11/17/2015   AST 18 11/17/2015   NA 143 11/17/2015   K 5.5 (H) 11/17/2015   CL 105 11/17/2015   CREATININE 0.89 11/17/2015   BUN 18 11/17/2015   CO2 31 11/17/2015   TSH 1.60 11/17/2015   HGBA1C 6.7 (H) 11/17/2015   MICROALBUR 1.6 11/17/2015

## 2015-11-28 NOTE — Assessment & Plan Note (Signed)
stable overall by history and exam, recent data reviewed with pt, and pt to continue medical treatment as before,  to f/u any worsening symptoms or concerns Lab Results  Component Value Date   HGBA1C 6.7 (H) 11/17/2015

## 2015-11-28 NOTE — Assessment & Plan Note (Signed)
stable overall by history and exam, recent data reviewed with pt, and pt to continue medical treatment as before,  to f/u any worsening symptoms or concerns BP Readings from Last 3 Encounters:  11/24/15 (!) 144/82  07/29/15 (!) 177/84  07/05/15 (!) 82/54

## 2015-12-03 ENCOUNTER — Ambulatory Visit: Payer: Self-pay

## 2015-12-03 ENCOUNTER — Encounter: Payer: Self-pay | Admitting: Gastroenterology

## 2015-12-31 ENCOUNTER — Ambulatory Visit: Payer: Self-pay

## 2015-12-31 DIAGNOSIS — M546 Pain in thoracic spine: Secondary | ICD-10-CM | POA: Diagnosis not present

## 2015-12-31 DIAGNOSIS — M791 Myalgia: Secondary | ICD-10-CM | POA: Diagnosis not present

## 2015-12-31 DIAGNOSIS — M461 Sacroiliitis, not elsewhere classified: Secondary | ICD-10-CM | POA: Diagnosis not present

## 2015-12-31 DIAGNOSIS — M792 Neuralgia and neuritis, unspecified: Secondary | ICD-10-CM | POA: Diagnosis not present

## 2016-01-05 ENCOUNTER — Ambulatory Visit: Payer: Self-pay

## 2016-01-07 ENCOUNTER — Ambulatory Visit
Admission: RE | Admit: 2016-01-07 | Discharge: 2016-01-07 | Disposition: A | Discharge status: Home / Self Care | Payer: Commercial Managed Care - HMO | Source: Ambulatory Visit | Attending: Internal Medicine | Admitting: Internal Medicine

## 2016-01-07 DIAGNOSIS — Z1231 Encounter for screening mammogram for malignant neoplasm of breast: Secondary | ICD-10-CM

## 2016-01-13 ENCOUNTER — Other Ambulatory Visit: Payer: Self-pay | Admitting: Internal Medicine

## 2016-01-13 DIAGNOSIS — R928 Other abnormal and inconclusive findings on diagnostic imaging of breast: Secondary | ICD-10-CM

## 2016-01-17 ENCOUNTER — Ambulatory Visit
Admission: RE | Admit: 2016-01-17 | Discharge: 2016-01-17 | Disposition: A | Discharge status: Home / Self Care | Payer: Commercial Managed Care - HMO | Source: Ambulatory Visit | Attending: Internal Medicine | Admitting: Internal Medicine

## 2016-01-17 DIAGNOSIS — R928 Other abnormal and inconclusive findings on diagnostic imaging of breast: Secondary | ICD-10-CM

## 2016-01-28 DIAGNOSIS — M791 Myalgia: Secondary | ICD-10-CM | POA: Diagnosis not present

## 2016-01-28 DIAGNOSIS — M461 Sacroiliitis, not elsewhere classified: Secondary | ICD-10-CM | POA: Diagnosis not present

## 2016-01-28 DIAGNOSIS — M546 Pain in thoracic spine: Secondary | ICD-10-CM | POA: Diagnosis not present

## 2016-01-28 DIAGNOSIS — M792 Neuralgia and neuritis, unspecified: Secondary | ICD-10-CM | POA: Diagnosis not present

## 2016-02-28 DIAGNOSIS — M791 Myalgia: Secondary | ICD-10-CM | POA: Diagnosis not present

## 2016-02-28 DIAGNOSIS — M546 Pain in thoracic spine: Secondary | ICD-10-CM | POA: Diagnosis not present

## 2016-02-28 DIAGNOSIS — M792 Neuralgia and neuritis, unspecified: Secondary | ICD-10-CM | POA: Diagnosis not present

## 2016-02-28 DIAGNOSIS — M461 Sacroiliitis, not elsewhere classified: Secondary | ICD-10-CM | POA: Diagnosis not present

## 2016-03-21 ENCOUNTER — Other Ambulatory Visit: Payer: Self-pay | Admitting: Internal Medicine

## 2016-03-21 NOTE — Telephone Encounter (Signed)
Faxed script back to [facility].../lmb 

## 2016-03-21 NOTE — Telephone Encounter (Signed)
Done hardcopy to Corinne  

## 2016-03-28 DIAGNOSIS — M546 Pain in thoracic spine: Secondary | ICD-10-CM | POA: Diagnosis not present

## 2016-03-28 DIAGNOSIS — M792 Neuralgia and neuritis, unspecified: Secondary | ICD-10-CM | POA: Diagnosis not present

## 2016-03-28 DIAGNOSIS — M461 Sacroiliitis, not elsewhere classified: Secondary | ICD-10-CM | POA: Diagnosis not present

## 2016-03-28 DIAGNOSIS — M791 Myalgia: Secondary | ICD-10-CM | POA: Diagnosis not present

## 2016-04-24 DIAGNOSIS — M792 Neuralgia and neuritis, unspecified: Secondary | ICD-10-CM | POA: Diagnosis not present

## 2016-04-24 DIAGNOSIS — M461 Sacroiliitis, not elsewhere classified: Secondary | ICD-10-CM | POA: Diagnosis not present

## 2016-04-24 DIAGNOSIS — M546 Pain in thoracic spine: Secondary | ICD-10-CM | POA: Diagnosis not present

## 2016-04-24 DIAGNOSIS — M791 Myalgia: Secondary | ICD-10-CM | POA: Diagnosis not present

## 2016-05-23 DIAGNOSIS — M546 Pain in thoracic spine: Secondary | ICD-10-CM | POA: Diagnosis not present

## 2016-05-23 DIAGNOSIS — M461 Sacroiliitis, not elsewhere classified: Secondary | ICD-10-CM | POA: Diagnosis not present

## 2016-05-23 DIAGNOSIS — M791 Myalgia: Secondary | ICD-10-CM | POA: Diagnosis not present

## 2016-05-23 DIAGNOSIS — M792 Neuralgia and neuritis, unspecified: Secondary | ICD-10-CM | POA: Diagnosis not present

## 2016-05-23 DIAGNOSIS — Z79891 Long term (current) use of opiate analgesic: Secondary | ICD-10-CM | POA: Diagnosis not present

## 2016-05-23 DIAGNOSIS — G894 Chronic pain syndrome: Secondary | ICD-10-CM | POA: Diagnosis not present

## 2016-05-24 ENCOUNTER — Ambulatory Visit: Payer: Self-pay | Admitting: Internal Medicine

## 2016-06-07 ENCOUNTER — Ambulatory Visit: Payer: Self-pay | Admitting: Internal Medicine

## 2016-06-08 ENCOUNTER — Telehealth: Payer: Self-pay | Admitting: Internal Medicine

## 2016-06-08 ENCOUNTER — Other Ambulatory Visit (INDEPENDENT_AMBULATORY_CARE_PROVIDER_SITE_OTHER): Payer: Commercial Managed Care - HMO

## 2016-06-08 DIAGNOSIS — E119 Type 2 diabetes mellitus without complications: Secondary | ICD-10-CM | POA: Diagnosis not present

## 2016-06-08 DIAGNOSIS — Z23 Encounter for immunization: Secondary | ICD-10-CM

## 2016-06-08 LAB — LIPID PANEL
CHOLESTEROL: 106 mg/dL (ref 0–200)
HDL: 44.8 mg/dL (ref >39.00)
LDL Cholesterol: 43 mg/dL (ref 0–99)
NONHDL: 60.7
Total CHOL/HDL Ratio: 2
Triglycerides: 89 mg/dL (ref 0.0–149.0)
VLDL: 17.8 mg/dL (ref 0.0–40.0)

## 2016-06-08 LAB — BASIC METABOLIC PANEL
BUN: 16 mg/dL (ref 6–23)
CHLORIDE: 106 meq/L (ref 96–112)
CO2: 25 meq/L (ref 19–32)
Calcium: 9.6 mg/dL (ref 8.4–10.5)
Creatinine, Ser: 0.82 mg/dL (ref 0.40–1.20)
GFR: 73.25 mL/min (ref >60.00)
Glucose, Bld: 141 mg/dL — ABNORMAL HIGH (ref 70–99)
Potassium: 4.5 mEq/L (ref 3.5–5.1)
SODIUM: 140 meq/L (ref 135–145)

## 2016-06-08 LAB — HEMOGLOBIN A1C: HEMOGLOBIN A1C: 6.9 % — AB (ref 4.6–6.5)

## 2016-06-08 LAB — HEPATIC FUNCTION PANEL
ALK PHOS: 120 U/L — AB (ref 39–117)
ALT: 18 U/L (ref 0–35)
AST: 22 U/L (ref 0–37)
Albumin: 4 g/dL (ref 3.5–5.2)
BILIRUBIN DIRECT: 0.1 mg/dL (ref 0.0–0.3)
TOTAL PROTEIN: 7.3 g/dL (ref 6.0–8.3)
Total Bilirubin: 0.4 mg/dL (ref 0.2–1.2)

## 2016-06-08 MED ORDER — CLONAZEPAM 1 MG PO TABS: 1.0000 mg | ORAL_TABLET | Freq: Two times a day (BID) | ORAL | 2 refills | Status: DC | PRN

## 2016-06-08 NOTE — Telephone Encounter (Signed)
Patient called back stating pharmacy did not receive fax.  Please refax to [facility].

## 2016-06-08 NOTE — Telephone Encounter (Signed)
Patient called back. I informed her it was being sent.

## 2016-06-08 NOTE — Telephone Encounter (Signed)
refaxed to [facility] at [phone]

## 2016-06-08 NOTE — Telephone Encounter (Signed)
clonazePAM (KLONOPIN) 1 MG tablet   Patient is requesting a refill on this medication.

## 2016-06-08 NOTE — Telephone Encounter (Signed)
Done hardcopy to [facility]  

## 2016-06-09 ENCOUNTER — Other Ambulatory Visit: Payer: Self-pay | Admitting: Internal Medicine

## 2016-06-14 ENCOUNTER — Ambulatory Visit: Payer: Self-pay | Admitting: Internal Medicine

## 2016-06-15 ENCOUNTER — Ambulatory Visit: Payer: Self-pay | Admitting: Internal Medicine

## 2016-06-20 ENCOUNTER — Ambulatory Visit (INDEPENDENT_AMBULATORY_CARE_PROVIDER_SITE_OTHER): Payer: Medicare HMO | Admitting: Internal Medicine

## 2016-06-20 ENCOUNTER — Encounter: Payer: Self-pay | Admitting: Internal Medicine

## 2016-06-20 VITALS — BP 122/82 | HR 74 | Temp 97.4°F | Ht 67.5 in | Wt 146.0 lb

## 2016-06-20 DIAGNOSIS — I10 Essential (primary) hypertension: Secondary | ICD-10-CM | POA: Diagnosis not present

## 2016-06-20 DIAGNOSIS — E119 Type 2 diabetes mellitus without complications: Secondary | ICD-10-CM | POA: Diagnosis not present

## 2016-06-20 DIAGNOSIS — Z0000 Encounter for general adult medical examination without abnormal findings: Secondary | ICD-10-CM | POA: Diagnosis not present

## 2016-06-20 DIAGNOSIS — E785 Hyperlipidemia, unspecified: Secondary | ICD-10-CM | POA: Diagnosis not present

## 2016-06-20 NOTE — Patient Instructions (Signed)
Please continue all other medications as before, and refills have been done if requested.  Please have the pharmacy call with any other refills you may need.  Please continue your efforts at being more active, low cholesterol diet, and weight control.  You are otherwise up to date with prevention measures today.  Please keep your appointments with your specialists as you may have planned  Please return in 6 months, or sooner if needed, with Lab testing done 3-5 days before  

## 2016-06-20 NOTE — Progress Notes (Signed)
Pre visit review using our clinic review tool, if applicable. No additional management support is needed unless otherwise documented below in the visit note. 

## 2016-06-20 NOTE — Progress Notes (Signed)
Subjective:    Patient ID: Crystal Thornton, female    DOB: [birth year], 71 y.o.   MRN: [id number]  HPI Here to f/u; overall doing ok,  Pt denies chest pain, increasing sob or doe, wheezing, orthopnea, PND, increased LE swelling, palpitations, dizziness or syncope.  Pt denies new neurological symptoms such as new headache, or facial or extremity weakness or numbness.  Pt denies polydipsia, polyuria, or low sugar episode.   Pt denies new neurological symptoms such as new headache, or facial or extremity weakness or numbness.   Pt states overall good compliance with meds, mostly trying to follow appropriate diet, with wt overall stable,  but little exercise however. Sister died 02/28/18with liver cancer, pt states mother with breast cancer, so more sad lately, no SI or HI.  CBG's in low 100's. Has had midl worsening depressive symptoms, declines referral for counseling Past Medical History:  Diagnosis Date  . ANGIOEDEMA 01/24/2010  . Anxiety   . ANXIETY 11/07/2006  . BACK PAIN 11/01/2007  . Cough 05/29/2008  . CYST, OVARIAN NEC/NOS 11/05/2006  . Depression   . DEPRESSION 11/07/2006  . Diabetes mellitus   . DIABETES MELLITUS, TYPE II 11/07/2006  . DIVERTICULOSIS, COLON 11/07/2006  . FIBROCYSTIC BREAST DISEASE 11/05/2006  . HEAD TRAUMA, CLOSED 01/24/2010  . HEPATITIS B, HX OF 11/05/2006  . Hepatitis C    Treated and no longer has  . HEPATITIS C 11/05/2006  . Hyperlipidemia   . HYPERLIPIDEMIA 11/07/2006  . Hypertension   . HYPERTENSION 05/29/2007  . INSOMNIA, HX OF 11/05/2006  . LOW BACK PAIN 05/29/2007  . Osteopenia   . OSTEOPENIA 11/07/2006  . Other specified sites of sprains and strains 08/31/2008  . Pain    R. shoulder and lower back and legs  . PULMONARY NODULE, LEFT UPPER LOBE 05/31/2009  . Seizures ([place])   . SHOULDER PAIN, RIGHT, CHRONIC 06/02/2010  . SINUSITIS- ACUTE-NOS 12/10/2007  . SKIN LESION 06/02/2010  . TOTAL ABDOMINAL HYSTERECTOMY, HX OF 03/27/1990  . Wears dentures    full top  . Wears  glasses    Past Surgical History:  Procedure Laterality Date  . APPENDECTOMY    . CERVICAL FUSION  2013  . CESAREAN SECTION    . COLONOSCOPY    . EUS N/A 07/29/2015   Procedure: UPPER ENDOSCOPIC ULTRASOUND (EUS) LINEAR;  Surgeon: Milus Banister, MD;  Location: [facility];  Service: Endoscopy;  Laterality: N/A;  . EXTERNAL FIXATION LEG Left 12/12/2012   Procedure: LEFT ADJUSTMENT OF EXTERNAL FIXATION UNDER ANESTHESIA;  Surgeon: Wylene Simmer, MD;  Location: [place];  Service: Orthopedics;  Laterality: Left;  . LIVER BIOPSY    . ORIF ANKLE FRACTURE Left 12/03/2012  . ORIF ANKLE FRACTURE Left 01/02/2013   Procedure: LEFT ANKLE REMOVAL OF EXTERNAL FIXATOR OPEN REDUCTION INTERNAL FIXATION (ORIF) TRIMALLEOLAR FRACTURE;  Surgeon: Wylene Simmer, MD;  Location: [place];  Service: Orthopedics;  Laterality: Left;  . OVARIAN CYST SURGERY     ruptured  . SHOULDER ARTHROSCOPY  2008   left  . TUBAL LIGATION      reports that she has been smoking Cigarettes.  She has been smoking about 0.25 packs per day. She has never used smokeless tobacco. She reports that she does not drink alcohol or use drugs. family history includes Liver cancer in her mother. Allergies  Allergen Reactions  . Naproxen Sodium Anaphylaxis  . Sulfonamide Derivatives Other (See Comments)    Causes nephritis  . Ace  Inhibitors Swelling    Lip swelling   Current Outpatient Prescriptions on File Prior to Visit  Medication Sig Dispense Refill  . aspirin EC 325 MG tablet Take 325 mg by mouth daily.    Marland Kitchen atenolol (TENORMIN) 50 MG tablet Take 1 tablet (50 mg total) by mouth daily. 90 tablet 3  . B Complex-C (B-COMPLEX WITH VITAMIN C) tablet Take 1 tablet by mouth daily.    . clonazePAM (KLONOPIN) 1 MG tablet Take 1 tablet (1 mg total) by mouth 2 (two) times daily as needed. for anxiety 60 tablet 2  . cyclobenzaprine (FLEXERIL) 10 MG tablet Take 10 mg by mouth 3 (three) times daily as needed for muscle  spasms.     Marland Kitchen GINKGO BILOBA COMPLEX PO Take 1 tablet by mouth daily.    . Ginseng 500 MG CAPS Take 1 capsule by mouth daily.    . metFORMIN (GLUCOPHAGE) 500 MG tablet TAKE 2 TABLETS BY MOUTH DAILY WITH BREAKFAST 180 tablet 3  . Multiple Vitamins-Minerals (CENTRUM SILVER PO) Take 1 tablet by mouth daily.      Marland Kitchen oxycodone (ROXICODONE) 30 MG immediate release tablet 30 mg 5 times daily as needed for pain. (Patient taking differently: Take 30 mg by mouth See admin instructions. 30mg  up to 5 times daily as needed for pain.) 20 tablet 0  . rosuvastatin (CRESTOR) 40 MG tablet Take 1 tablet (40 mg total) by mouth daily. 90 tablet 3  . sertraline (ZOLOFT) 100 MG tablet Take 2 tablets (200 mg total) by mouth daily. Yearly physical w/labs due in August 180 tablet 3  . fesoterodine (TOVIAZ) 4 MG TB24 tablet Take 1 tablet (4 mg total) by mouth daily. (Patient not taking: Reported on 06/20/2016) 90 tablet 3   No current facility-administered medications on file prior to visit.    Review of Systems  Constitutional: Negative for other unusual diaphoresis or sweats HENT: Negative for ear discharge or swelling Eyes: Negative for other worsening visual disturbances Respiratory: Negative for stridor or other swelling  Gastrointestinal: Negative for worsening distension or other blood Genitourinary: Negative for retention or other urinary change Musculoskeletal: Negative for other MSK pain or swelling Skin: Negative for color change or other new lesions Neurological: Negative for worsening tremors and other numbness  Psychiatric/Behavioral: Negative for worsening agitation or other fatigue All other system neg per pt    Objective:   Physical Exam BP 122/82   Pulse 74   Temp 97.4 F (36.3 C) (Oral)   Ht 5' 7.5" (1.715 m)   Wt 146 lb (66.2 kg)   SpO2 98%   BMI 22.53 kg/m  VS noted,  Constitutional: Pt appears in NAD HENT: Head: NCAT.  Right Ear: External ear normal.  Left Ear: External ear normal.    Eyes: . Pupils are equal, round, and reactive to light. Conjunctivae and EOM are normal Nose: without d/c or deformity Neck: Neck supple. Gross normal ROM Cardiovascular: Normal rate and regular rhythm.   Pulmonary/Chest: Effort normal and breath sounds without rales or wheezing.  Neurological: Pt is alert. At baseline orientation, motor grossly intact Skin: Skin is warm. No rashes, other new lesions, no LE edema Psychiatric: Pt behavior is normal without agitation , mild depressed affect No other exam findings    Assessment & Plan:

## 2016-06-24 NOTE — Assessment & Plan Note (Signed)
stable overall by history and exam, recent data reviewed with pt, and pt to continue medical treatment as before,  to f/u any worsening symptoms or concerns Lab Results  Component Value Date   LDLCALC 43 06/08/2016

## 2016-06-24 NOTE — Assessment & Plan Note (Signed)
stable overall by history and exam, recent data reviewed with pt, and pt to continue medical treatment as before,  to f/u any worsening symptoms or concerns BP Readings from Last 3 Encounters:  06/20/16 122/82  11/24/15 (!) 144/82  07/29/15 (!) 177/84

## 2016-06-24 NOTE — Assessment & Plan Note (Signed)
stable overall by history and exam, recent data reviewed with pt, and pt to continue medical treatment as before,  to f/u any worsening symptoms or concerns Lab Results  Component Value Date   HGBA1C 6.9 (H) 06/08/2016

## 2016-06-27 DIAGNOSIS — M791 Myalgia: Secondary | ICD-10-CM | POA: Diagnosis not present

## 2016-06-27 DIAGNOSIS — M542 Cervicalgia: Secondary | ICD-10-CM | POA: Diagnosis not present

## 2016-06-27 DIAGNOSIS — M47816 Spondylosis without myelopathy or radiculopathy, lumbar region: Secondary | ICD-10-CM | POA: Diagnosis not present

## 2016-06-27 DIAGNOSIS — M5031 Other cervical disc degeneration,  high cervical region: Secondary | ICD-10-CM | POA: Diagnosis not present

## 2016-06-27 DIAGNOSIS — M792 Neuralgia and neuritis, unspecified: Secondary | ICD-10-CM | POA: Diagnosis not present

## 2016-06-27 DIAGNOSIS — M5136 Other intervertebral disc degeneration, lumbar region: Secondary | ICD-10-CM | POA: Diagnosis not present

## 2016-06-27 DIAGNOSIS — G894 Chronic pain syndrome: Secondary | ICD-10-CM | POA: Diagnosis not present

## 2016-06-27 DIAGNOSIS — M5033 Other cervical disc degeneration, cervicothoracic region: Secondary | ICD-10-CM | POA: Diagnosis not present

## 2016-06-27 DIAGNOSIS — M5137 Other intervertebral disc degeneration, lumbosacral region: Secondary | ICD-10-CM | POA: Diagnosis not present

## 2016-06-27 DIAGNOSIS — M546 Pain in thoracic spine: Secondary | ICD-10-CM | POA: Diagnosis not present

## 2016-06-27 DIAGNOSIS — M461 Sacroiliitis, not elsewhere classified: Secondary | ICD-10-CM | POA: Diagnosis not present

## 2016-06-27 DIAGNOSIS — Z79891 Long term (current) use of opiate analgesic: Secondary | ICD-10-CM | POA: Diagnosis not present

## 2016-07-07 IMAGING — MR MR 3D RECON AT [facility]AN[facility]
19 of 23 series · 19 of 23 positions shown · IV contrast (multihance)
Comparison: Ultrasound the abdomen 06/30/2015.

CLINICAL DATA: Right-sided pain for 4 weeks. Ultrasound
demonstrating possible common duct stone.

EXAM:
MRI ABDOMEN WITHOUT AND WITH CONTRAST (INCLUDING MRCP)
TECHNIQUE: Multiplanar multisequence MR imaging of the abdomen was performed
both before and after the administration of intravenous contrast.
Heavily T2-weighted images of the biliary and pancreatic ducts were
obtained, and three-dimensional MRCP images were rendered by post
processing.
CONTRAST:  13mL MULTIHANCE GADOBENATE DIMEGLUMINE 529 MG/ML IV SOLN

[Series 3: T2 fat-sat · axial · 5.0mm · 0.78mm/px · 1 of 54 slices shown]
[im 1/54]
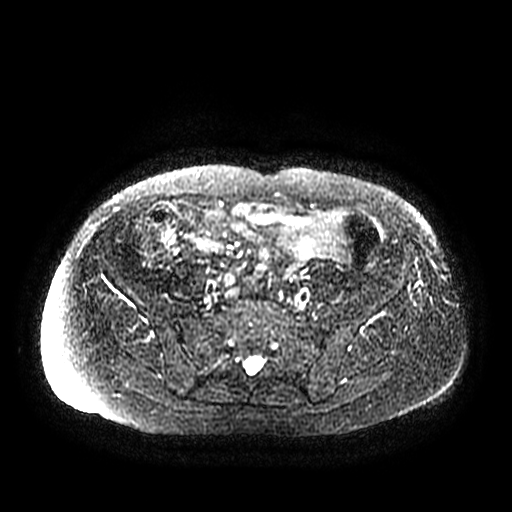

[Series 4: MRCP · coronal · 1.6mm · 0.62mm/px · 1 of 96 slices shown (1 of 2)]
[im 1/96]
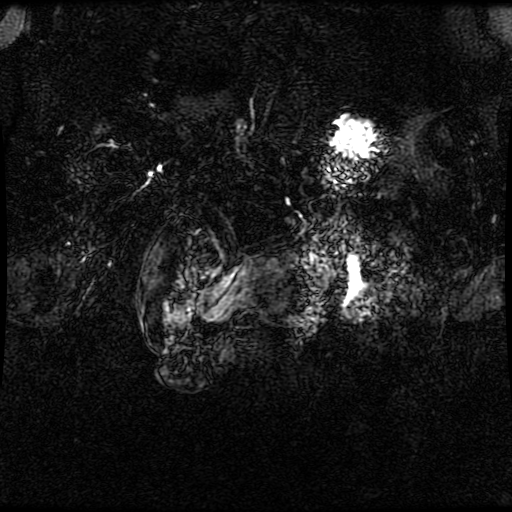

[Series 6: DWI b500 · axial · 6.0mm · 1.48mm/px · 1 of 62 slices shown]
[im 1/62]
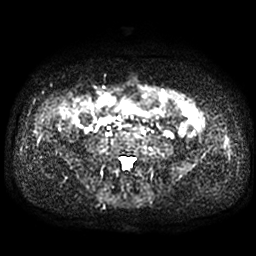

[Series 9: ax dualecho · axial · 5.0mm · 0.78mm/px · 1 of 106 slices shown]
[im 1/106]
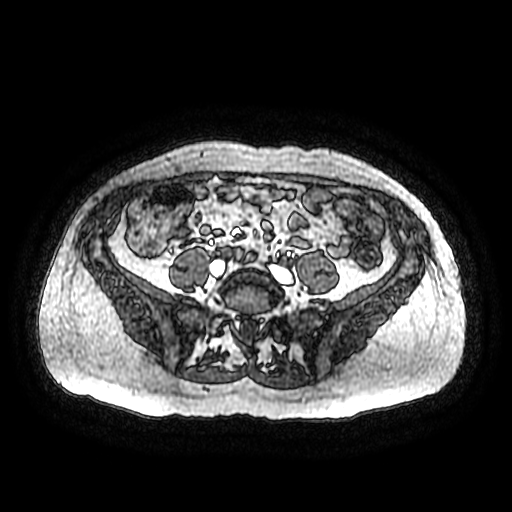

[Series 10: MRCP · sagittal · 40.0mm · 0.70mm/px · 1 of 6 slices shown (2 of 2)]
[im 1/6]
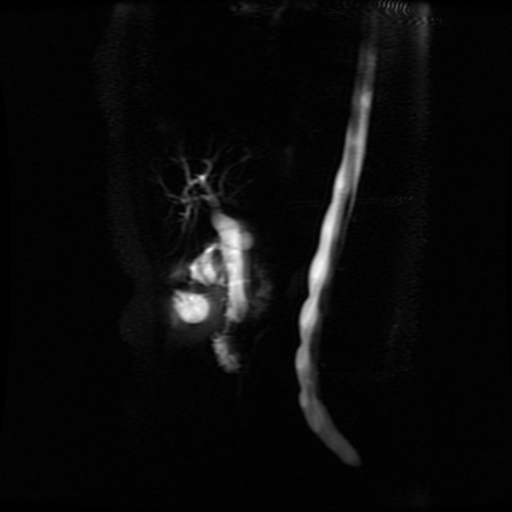

[Series 11: bSSFP fat-sat · coronal · 5.0mm · 0.70mm/px · 1 of 44 slices shown]
[im 1/44]
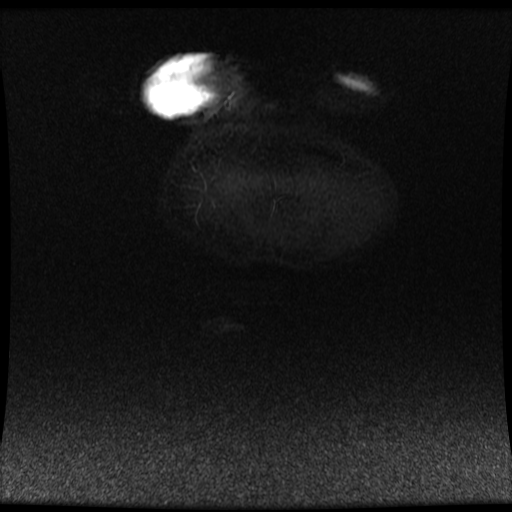

[Series 12: T2 · axial · 5.0mm · 0.78mm/px · 1 of 53 slices shown]
[im 1/53]
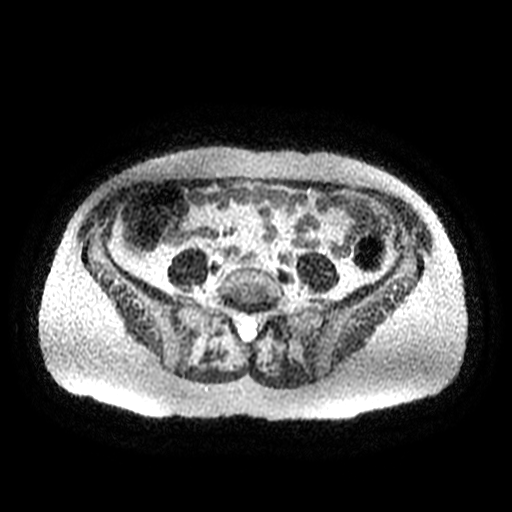

[Series 400: reformatted · axial · 1.6mm · 0.62mm/px · 1 of 95 slices shown (1 of 2)]
[im 1/95]
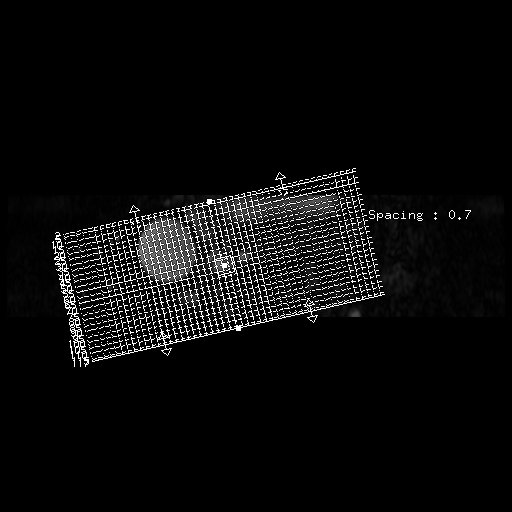

[Series 401: reformatted · axial · 1.6mm · 0.62mm/px · 1 of 22 slices shown (2 of 2)]
[im 1/22]
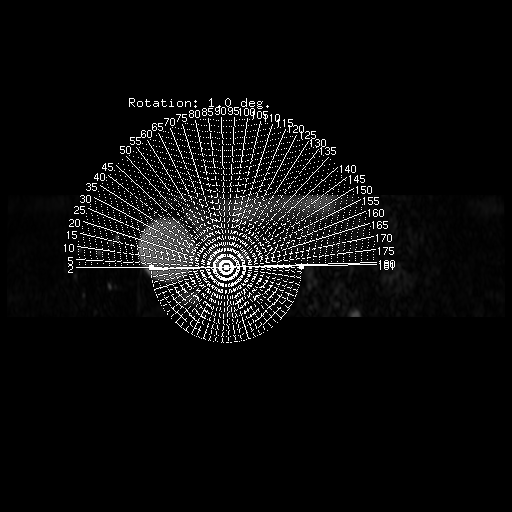

[Series 600: DWI · axial · 6.0mm · 1.48mm/px · 1 of 31 slices shown]
[im 1/31]
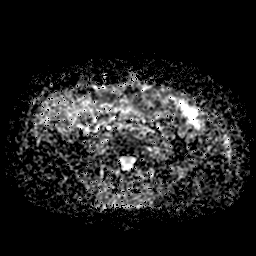

[Series 1300: T1 dynamic · axial · 5.0mm · 0.78mm/px · 1 of 96 slices shown (1 of 5)]
[im 1/96]
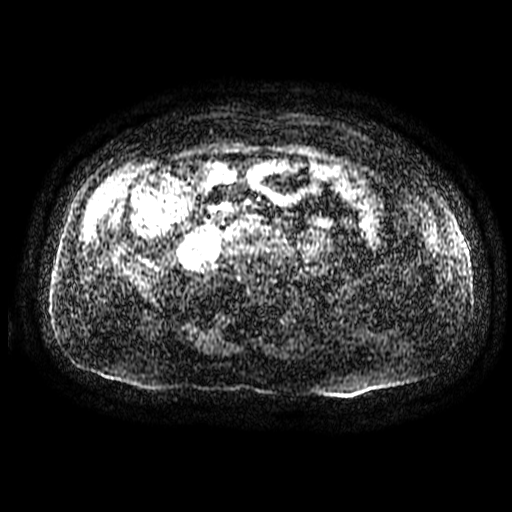

[Series 1301: T1 dynamic · axial · 5.0mm · 0.78mm/px · 1 of 96 slices shown (2 of 5)]
[im 1/96]
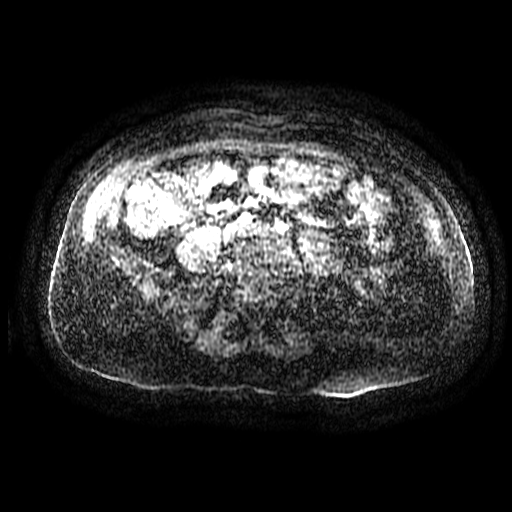

[Series 1303: T1 dynamic · axial · 5.0mm · 0.78mm/px · 1 of 96 slices shown (3 of 5)]
[im 1/96]
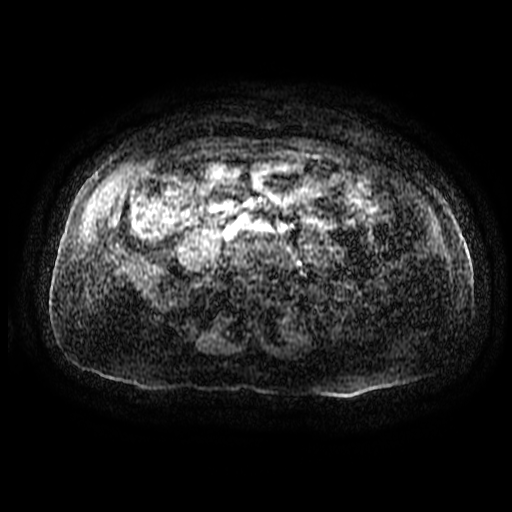

[Series 1304: T1 dynamic · axial · 5.0mm · 0.78mm/px · 1 of 96 slices shown (4 of 5)]
[im 1/96]
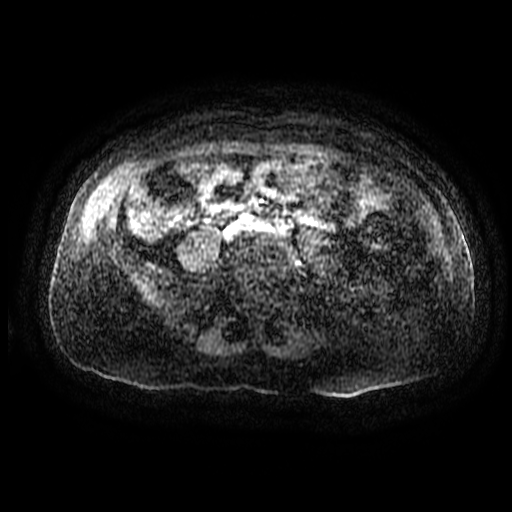

[Series 1305: T1 dynamic · axial · 5.0mm · 0.78mm/px · 1 of 96 slices shown (5 of 5)]
[im 1/96]
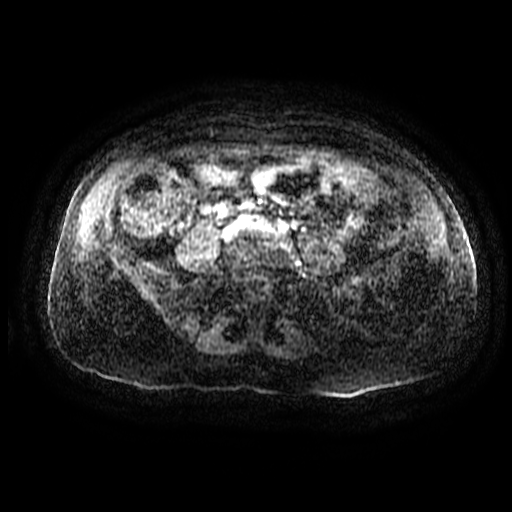

[((id)/(id)/1)-((id)/(id)/1) · axial · 5.0mm · 0.78mm/px · 1 of 96 slices shown (1 of 4)]
[im 1/96]
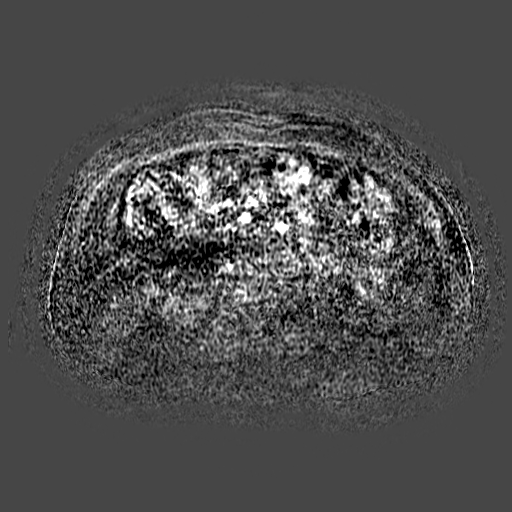

[((id)/(id)/1)-((id)/(id)/1) · axial · 5.0mm · 0.78mm/px · 1 of 96 slices shown (2 of 4)]
[im 1/96]
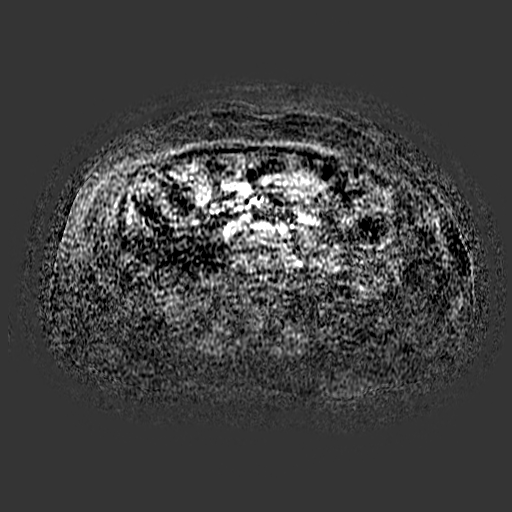

[((id)/(id)/1)-((id)/(id)/1) · axial · 5.0mm · 0.78mm/px · 1 of 96 slices shown (3 of 4)]
[im 1/96]
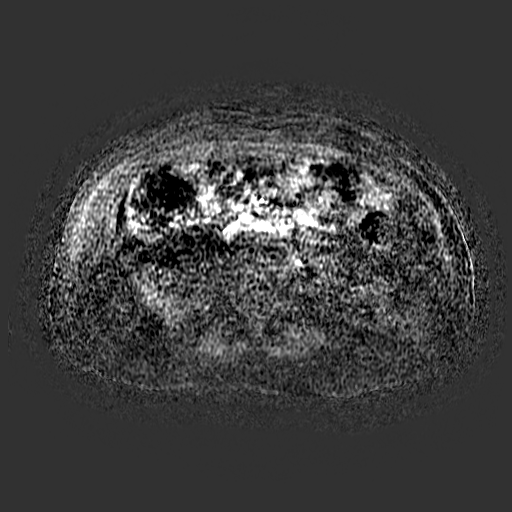

[((id)/(id)/1)-((id)/(id)/1) · axial · 5.0mm · 0.78mm/px · 1 of 96 slices shown (4 of 4)]
[im 1/96]
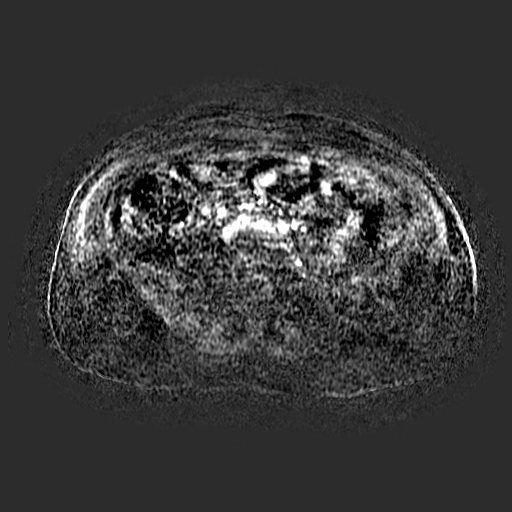

[19 of 23 positions shown; findings below may reference images not displayed]

FINDINGS: Portions of exam are mildly motion degraded.

Lower chest: Mild cardiomegaly, without pericardial or pleural
effusion.

Hepatobiliary: Right hepatic lobe 10 mm lesion is likely a
hemangioma, given T2 hyperintensity and delayed post-contrast
enhancement. No suspicious liver lesion.

No gallstones or gallbladder wall thickening. Moderate intrahepatic
duct dilatation at 7 mm on image 15/series 3. There is moderate
common duct dilatation throughout, including at 13 mm in the porta
hepatis on image 49/series 4. Relatively mild tapering at 12 mm in
the region of the pancreatic head on image 42/series 4. Common duct
dilatation continues to level of the ampulla, where an approximately
10-11 mm area of soft tissue fullness is identified on image
33/series 3 and image 70/ series 8066 and 4.

Pancreas: Mild pancreatic atrophy. Pancreatic duct dilatation is
also moderate, including at 6 mm on image 26/ series 12. No
pancreatic mass. Suspect a variant of pancreas divisum, with a
diminutive dorsal duct entering the duodenum on image 27/series 3.

Spleen: Normal in size, without focal abnormality.

Adrenals/Urinary Tract: Normal adrenal glands. Bilateral renal
cysts. No hydronephrosis.

Stomach/Bowel: Normal stomach and abdominal bowel loops.

Vascular/Lymphatic: Normal caliber of the aorta and branch vessels.
No retroperitoneal or retrocrural adenopathy.

Other: No ascites.

Musculoskeletal: No acute osseous abnormality.
IMPRESSION: 1. No evidence of choledocholithiasis.
2. Biliary and pancreatic duct dilatation, followed to the level of
the ampulla. An area of soft tissue fullness at the ampulla is
suspicious for an ampullary lesion. Ampullary edema secondary to
recent stone passage or an ampullary stenosis could look similar.
ERCP should be considered. These results will be called to the
ordering clinician or representative by the Radiologist Assistant,
and communication documented in the PACS or zVision Dashboard.

## 2016-07-24 DIAGNOSIS — M792 Neuralgia and neuritis, unspecified: Secondary | ICD-10-CM | POA: Diagnosis not present

## 2016-07-24 DIAGNOSIS — M5031 Other cervical disc degeneration,  high cervical region: Secondary | ICD-10-CM | POA: Diagnosis not present

## 2016-07-24 DIAGNOSIS — M791 Myalgia: Secondary | ICD-10-CM | POA: Diagnosis not present

## 2016-07-24 DIAGNOSIS — M5033 Other cervical disc degeneration, cervicothoracic region: Secondary | ICD-10-CM | POA: Diagnosis not present

## 2016-07-24 DIAGNOSIS — M461 Sacroiliitis, not elsewhere classified: Secondary | ICD-10-CM | POA: Diagnosis not present

## 2016-07-24 DIAGNOSIS — Z79891 Long term (current) use of opiate analgesic: Secondary | ICD-10-CM | POA: Diagnosis not present

## 2016-07-24 DIAGNOSIS — M546 Pain in thoracic spine: Secondary | ICD-10-CM | POA: Diagnosis not present

## 2016-07-24 DIAGNOSIS — G894 Chronic pain syndrome: Secondary | ICD-10-CM | POA: Diagnosis not present

## 2016-07-24 DIAGNOSIS — M5136 Other intervertebral disc degeneration, lumbar region: Secondary | ICD-10-CM | POA: Diagnosis not present

## 2016-07-24 DIAGNOSIS — M542 Cervicalgia: Secondary | ICD-10-CM | POA: Diagnosis not present

## 2016-07-24 DIAGNOSIS — Z96651 Presence of right artificial knee joint: Secondary | ICD-10-CM | POA: Diagnosis not present

## 2016-07-24 DIAGNOSIS — M47816 Spondylosis without myelopathy or radiculopathy, lumbar region: Secondary | ICD-10-CM | POA: Diagnosis not present

## 2016-08-07 DIAGNOSIS — M47816 Spondylosis without myelopathy or radiculopathy, lumbar region: Secondary | ICD-10-CM | POA: Diagnosis not present

## 2016-08-07 DIAGNOSIS — M542 Cervicalgia: Secondary | ICD-10-CM | POA: Diagnosis not present

## 2016-08-07 DIAGNOSIS — M5033 Other cervical disc degeneration, cervicothoracic region: Secondary | ICD-10-CM | POA: Diagnosis not present

## 2016-08-07 DIAGNOSIS — M5031 Other cervical disc degeneration,  high cervical region: Secondary | ICD-10-CM | POA: Diagnosis not present

## 2016-08-07 DIAGNOSIS — G894 Chronic pain syndrome: Secondary | ICD-10-CM | POA: Diagnosis not present

## 2016-08-07 DIAGNOSIS — M792 Neuralgia and neuritis, unspecified: Secondary | ICD-10-CM | POA: Diagnosis not present

## 2016-08-07 DIAGNOSIS — Z96651 Presence of right artificial knee joint: Secondary | ICD-10-CM | POA: Diagnosis not present

## 2016-08-07 DIAGNOSIS — M546 Pain in thoracic spine: Secondary | ICD-10-CM | POA: Diagnosis not present

## 2016-08-07 DIAGNOSIS — M5136 Other intervertebral disc degeneration, lumbar region: Secondary | ICD-10-CM | POA: Diagnosis not present

## 2016-08-07 DIAGNOSIS — M461 Sacroiliitis, not elsewhere classified: Secondary | ICD-10-CM | POA: Diagnosis not present

## 2016-08-07 DIAGNOSIS — M791 Myalgia: Secondary | ICD-10-CM | POA: Diagnosis not present

## 2016-08-07 DIAGNOSIS — M5137 Other intervertebral disc degeneration, lumbosacral region: Secondary | ICD-10-CM | POA: Diagnosis not present

## 2016-08-29 ENCOUNTER — Ambulatory Visit: Payer: Self-pay

## 2016-08-29 NOTE — Progress Notes (Deleted)
Pre visit review using our clinic review tool, if applicable. No additional management support is needed unless otherwise documented below in the visit note. 

## 2016-08-29 NOTE — Progress Notes (Deleted)
Subjective:   Crystal Thornton is a 71 y.o. female who presents for an Initial Medicare Annual Wellness Visit.  Review of Systems    No ROS.  Medicare Wellness Visit.   Sleep patterns: {SX; SLEEP PATTERNS:18802::"feels rested on waking","does not get up to void","gets up *** times nightly to void","sleeps *** hours nightly"}.   Home Safety/Smoke Alarms:   Living environment; residence and Firearm Safety: {Rehab home environment / accessibility:30080::"no firearms","firearms stored safely"}. Seat Belt Safety/Bike Helmet: Wears seat belt.   Counseling:   Eye Exam-  Dental-  Female:   Pap- N/A     Mammo-  Last 01/07/16, BI-RADS CATEGORY  1: Negative     Dexa scan-  Last 10/31/10, normal      CCS- Last 07/18/10, 10 year recall      Objective:    There were no vitals filed for this visit. There is no height or weight on file to calculate BMI.   Current Medications (verified) Outpatient Encounter Prescriptions as of 08/29/2016  Medication Sig  . aspirin EC 325 MG tablet Take 325 mg by mouth daily.  Marland Kitchen atenolol (TENORMIN) 50 MG tablet Take 1 tablet (50 mg total) by mouth daily.  . B Complex-C (B-COMPLEX WITH VITAMIN C) tablet Take 1 tablet by mouth daily.  . clonazePAM (KLONOPIN) 1 MG tablet Take 1 tablet (1 mg total) by mouth 2 (two) times daily as needed. for anxiety  . cyclobenzaprine (FLEXERIL) 10 MG tablet Take 10 mg by mouth 3 (three) times daily as needed for muscle spasms.   . fesoterodine (TOVIAZ) 4 MG TB24 tablet Take 1 tablet (4 mg total) by mouth daily. (Patient not taking: Reported on 06/20/2016)  . GINKGO BILOBA COMPLEX PO Take 1 tablet by mouth daily.  . Ginseng 500 MG CAPS Take 1 capsule by mouth daily.  . metFORMIN (GLUCOPHAGE) 500 MG tablet TAKE 2 TABLETS BY MOUTH DAILY WITH BREAKFAST  . Multiple Vitamins-Minerals (CENTRUM SILVER PO) Take 1 tablet by mouth daily.    Marland Kitchen oxycodone (ROXICODONE) 30 MG immediate release tablet 30 mg 5 times daily as needed for pain.  (Patient taking differently: Take 30 mg by mouth See admin instructions. 30mg  up to 5 times daily as needed for pain.)  . rosuvastatin (CRESTOR) 40 MG tablet Take 1 tablet (40 mg total) by mouth daily.  . sertraline (ZOLOFT) 100 MG tablet Take 2 tablets (200 mg total) by mouth daily. Yearly physical w/labs due in August   No facility-administered encounter medications on file as of 08/29/2016.     Allergies (verified) Naproxen sodium; Sulfonamide derivatives; and Ace inhibitors   History: Past Medical History:  Diagnosis Date  . ANGIOEDEMA 01/24/2010  . Anxiety   . ANXIETY 11/07/2006  . BACK PAIN 11/01/2007  . Cough 05/29/2008  . CYST, OVARIAN NEC/NOS 11/05/2006  . Depression   . DEPRESSION 11/07/2006  . Diabetes mellitus   . DIABETES MELLITUS, TYPE II 11/07/2006  . DIVERTICULOSIS, COLON 11/07/2006  . FIBROCYSTIC BREAST DISEASE 11/05/2006  . HEAD TRAUMA, CLOSED 01/24/2010  . HEPATITIS B, HX OF 11/05/2006  . Hepatitis C    Treated and no longer has  . HEPATITIS C 11/05/2006  . Hyperlipidemia   . HYPERLIPIDEMIA 11/07/2006  . Hypertension   . HYPERTENSION 05/29/2007  . INSOMNIA, HX OF 11/05/2006  . LOW BACK PAIN 05/29/2007  . Osteopenia   . OSTEOPENIA 11/07/2006  . Other specified sites of sprains and strains 08/31/2008  . Pain    R. shoulder and lower back and  legs  . PULMONARY NODULE, LEFT UPPER LOBE 05/31/2009  . Seizures ([place])   . SHOULDER PAIN, RIGHT, CHRONIC 06/02/2010  . SINUSITIS- ACUTE-NOS 12/10/2007  . SKIN LESION 06/02/2010  . TOTAL ABDOMINAL HYSTERECTOMY, HX OF 03/27/1990  . Wears dentures    full top  . Wears glasses    Past Surgical History:  Procedure Laterality Date  . APPENDECTOMY    . CERVICAL FUSION  2013  . CESAREAN SECTION    . COLONOSCOPY    . EUS N/A 07/29/2015   Procedure: UPPER ENDOSCOPIC ULTRASOUND (EUS) LINEAR;  Surgeon: Milus Banister, MD;  Location: [facility];  Service: Endoscopy;  Laterality: N/A;  . EXTERNAL FIXATION LEG Left 12/12/2012   Procedure: LEFT  ADJUSTMENT OF EXTERNAL FIXATION UNDER ANESTHESIA;  Surgeon: Wylene Simmer, MD;  Location: [place];  Service: Orthopedics;  Laterality: Left;  . LIVER BIOPSY    . ORIF ANKLE FRACTURE Left 12/03/2012  . ORIF ANKLE FRACTURE Left 01/02/2013   Procedure: LEFT ANKLE REMOVAL OF EXTERNAL FIXATOR OPEN REDUCTION INTERNAL FIXATION (ORIF) TRIMALLEOLAR FRACTURE;  Surgeon: Wylene Simmer, MD;  Location: [place];  Service: Orthopedics;  Laterality: Left;  . OVARIAN CYST SURGERY     ruptured  . SHOULDER ARTHROSCOPY  2008   left  . TUBAL LIGATION     Family History  Problem Relation Age of Onset  . Liver cancer Mother    Social History   Occupational History  . Not on file.   Social History Main Topics  . Smoking status: Current Every Day Smoker    Packs/day: 0.25    Types: Cigarettes  . Smokeless tobacco: Never Used  . Alcohol use No  . Drug use: No  . Sexual activity: Not on file     Comment: trying to cut down    Tobacco Counseling Ready to quit: Not Answered Counseling given: Not Answered   Activities of Daily Living No flowsheet data found.  Immunizations and Health Maintenance Immunization History  Administered Date(s) Administered  . Influenza Split 01/04/2011  . Influenza Whole 01/10/2008, 05/31/2009, 11/30/2009  . Influenza,inj,Quad PF,36+ Mos 01/21/2014, 11/24/2015  . Pneumococcal Conjugate-13 07/23/2013  . Pneumococcal Polysaccharide-23 05/31/2009  . Td 04/27/2005  . Tdap 11/24/2015  . Zoster 05/29/2008   Health Maintenance Due  Topic Date Due  . Hepatitis C Screening  December 06, 1945  . OPHTHALMOLOGY EXAM  05/30/1956  . FOOT EXAM  07/24/2014  . PNA vac Low Risk Adult (2 of 2 - PPSV23) 07/24/2014    Patient Care Team: Biagio Borg, MD as PCP - General  Indicate any recent Medical Services you may have received from other than [facility] providers in the past year (date may be approximate).     Assessment:   This is a routine wellness  examination for Crystal Thornton. Physical assessment deferred to PCP.   Hearing/Vision screen No exam data present  Dietary issues and exercise activities discussed:   Diet (meal preparation, eat out, water intake, caffeinated beverages, dairy products, fruits and vegetables): {Desc; diets:16563} Breakfast: Lunch:  Dinner:      Goals    None     Depression Screen PHQ 2/9 Scores 06/20/2016 11/24/2015 06/24/2015 07/23/2013  PHQ - 2 Score 0 0 0 1    Fall Risk Fall Risk  06/20/2016 11/24/2015 06/24/2015 07/23/2013  Falls in the past year? Yes No No Yes  Number falls in past yr: 1 - - 1  Injury with Fall? - - - Yes    Cognitive Function:  Screening Tests Health Maintenance  Topic Date Due  . Hepatitis C Screening  06-18-1945  . OPHTHALMOLOGY EXAM  05/30/1956  . FOOT EXAM  07/24/2014  . PNA vac Low Risk Adult (2 of 2 - PPSV23) 07/24/2014  . INFLUENZA VACCINE  10/25/2016  . URINE MICROALBUMIN  11/16/2016  . HEMOGLOBIN A1C  12/09/2016  . MAMMOGRAM  01/06/2018  . COLONOSCOPY  07/17/2020  . TETANUS/TDAP  11/23/2025  . DEXA SCAN  Completed      Plan:     I have personally reviewed and noted the following in the patient's chart:   . Medical and social history . Use of alcohol, tobacco or illicit drugs  . Current medications and supplements . Functional ability and status . Nutritional status . Physical activity . Advanced directives . List of other physicians . Vitals . Screenings to include cognitive, depression, and falls . Referrals and appointments  In addition, I have reviewed and discussed with patient certain preventive protocols, quality metrics, and best practice recommendations. A written personalized care plan for preventive services as well as general preventive health recommendations were provided to patient.     Michiel Cowboy, RN   08/29/2016

## 2016-09-06 DIAGNOSIS — M5031 Other cervical disc degeneration,  high cervical region: Secondary | ICD-10-CM | POA: Diagnosis not present

## 2016-09-06 DIAGNOSIS — M47816 Spondylosis without myelopathy or radiculopathy, lumbar region: Secondary | ICD-10-CM | POA: Diagnosis not present

## 2016-09-06 DIAGNOSIS — M792 Neuralgia and neuritis, unspecified: Secondary | ICD-10-CM | POA: Diagnosis not present

## 2016-09-06 DIAGNOSIS — M5033 Other cervical disc degeneration, cervicothoracic region: Secondary | ICD-10-CM | POA: Diagnosis not present

## 2016-09-06 DIAGNOSIS — M5137 Other intervertebral disc degeneration, lumbosacral region: Secondary | ICD-10-CM | POA: Diagnosis not present

## 2016-09-06 DIAGNOSIS — M791 Myalgia: Secondary | ICD-10-CM | POA: Diagnosis not present

## 2016-09-06 DIAGNOSIS — M5136 Other intervertebral disc degeneration, lumbar region: Secondary | ICD-10-CM | POA: Diagnosis not present

## 2016-09-06 DIAGNOSIS — Z96651 Presence of right artificial knee joint: Secondary | ICD-10-CM | POA: Diagnosis not present

## 2016-09-06 DIAGNOSIS — M542 Cervicalgia: Secondary | ICD-10-CM | POA: Diagnosis not present

## 2016-09-06 DIAGNOSIS — M546 Pain in thoracic spine: Secondary | ICD-10-CM | POA: Diagnosis not present

## 2016-09-06 DIAGNOSIS — M461 Sacroiliitis, not elsewhere classified: Secondary | ICD-10-CM | POA: Diagnosis not present

## 2016-09-06 DIAGNOSIS — G894 Chronic pain syndrome: Secondary | ICD-10-CM | POA: Diagnosis not present

## 2016-09-15 NOTE — Progress Notes (Deleted)
Pre visit review using our clinic review tool, if applicable. No additional management support is needed unless otherwise documented below in the visit note. 

## 2016-09-15 NOTE — Progress Notes (Deleted)
Subjective:   Crystal Thornton is a 71 y.o. female who presents for an Initial Medicare Annual Wellness Visit.  Review of Systems    No ROS.  Medicare Wellness Visit. Additional risk factors are reflected in the social history.     Sleep patterns: {SX; SLEEP PATTERNS:18802::"feels rested on waking","does not get up to void","gets up *** times nightly to void","sleeps *** hours nightly"}.   Home Safety/Smoke Alarms: Feels safe in home. Smoke alarms in place.  Living environment; residence and Firearm Safety: {Rehab home environment / accessibility:30080::"no firearms","firearms stored safely"}. Seat Belt Safety/Bike Helmet: Wears seat belt.   Counseling:   Eye Exam-  Dental-  Female:   Pap- N/A     Mammo- Last 01/07/16, BI-RADS CATEGORY  1: Negative     Dexa scan- Last 10/31/10       CCS- Last 07/18/10, recall 10 years     Objective:    There were no vitals filed for this visit. There is no height or weight on file to calculate BMI.   Current Medications (verified) Outpatient Encounter Prescriptions as of 09/18/2016  Medication Sig  . aspirin EC 325 MG tablet Take 325 mg by mouth daily.  Marland Kitchen atenolol (TENORMIN) 50 MG tablet Take 1 tablet (50 mg total) by mouth daily.  . B Complex-C (B-COMPLEX WITH VITAMIN C) tablet Take 1 tablet by mouth daily.  . clonazePAM (KLONOPIN) 1 MG tablet Take 1 tablet (1 mg total) by mouth 2 (two) times daily as needed. for anxiety  . cyclobenzaprine (FLEXERIL) 10 MG tablet Take 10 mg by mouth 3 (three) times daily as needed for muscle spasms.   . fesoterodine (TOVIAZ) 4 MG TB24 tablet Take 1 tablet (4 mg total) by mouth daily. (Patient not taking: Reported on 06/20/2016)  . GINKGO BILOBA COMPLEX PO Take 1 tablet by mouth daily.  . Ginseng 500 MG CAPS Take 1 capsule by mouth daily.  . metFORMIN (GLUCOPHAGE) 500 MG tablet TAKE 2 TABLETS BY MOUTH DAILY WITH BREAKFAST  . Multiple Vitamins-Minerals (CENTRUM SILVER PO) Take 1 tablet by mouth daily.    Marland Kitchen  oxycodone (ROXICODONE) 30 MG immediate release tablet 30 mg 5 times daily as needed for pain. (Patient taking differently: Take 30 mg by mouth See admin instructions. 30mg  up to 5 times daily as needed for pain.)  . rosuvastatin (CRESTOR) 40 MG tablet Take 1 tablet (40 mg total) by mouth daily.  . sertraline (ZOLOFT) 100 MG tablet Take 2 tablets (200 mg total) by mouth daily. Yearly physical w/labs due in August   No facility-administered encounter medications on file as of 09/18/2016.     Allergies (verified) Naproxen sodium; Sulfonamide derivatives; and Ace inhibitors   History: Past Medical History:  Diagnosis Date  . ANGIOEDEMA 01/24/2010  . Anxiety   . ANXIETY 11/07/2006  . BACK PAIN 11/01/2007  . Cough 05/29/2008  . CYST, OVARIAN NEC/NOS 11/05/2006  . Depression   . DEPRESSION 11/07/2006  . Diabetes mellitus   . DIABETES MELLITUS, TYPE II 11/07/2006  . DIVERTICULOSIS, COLON 11/07/2006  . FIBROCYSTIC BREAST DISEASE 11/05/2006  . HEAD TRAUMA, CLOSED 01/24/2010  . HEPATITIS B, HX OF 11/05/2006  . Hepatitis C    Treated and no longer has  . HEPATITIS C 11/05/2006  . Hyperlipidemia   . HYPERLIPIDEMIA 11/07/2006  . Hypertension   . HYPERTENSION 05/29/2007  . INSOMNIA, HX OF 11/05/2006  . LOW BACK PAIN 05/29/2007  . Osteopenia   . OSTEOPENIA 11/07/2006  . Other specified sites of sprains  and strains 08/31/2008  . Pain    R. shoulder and lower back and legs  . PULMONARY NODULE, LEFT UPPER LOBE 05/31/2009  . Seizures ([place])   . SHOULDER PAIN, RIGHT, CHRONIC 06/02/2010  . SINUSITIS- ACUTE-NOS 12/10/2007  . SKIN LESION 06/02/2010  . TOTAL ABDOMINAL HYSTERECTOMY, HX OF 03/27/1990  . Wears dentures    full top  . Wears glasses    Past Surgical History:  Procedure Laterality Date  . APPENDECTOMY    . CERVICAL FUSION  2013  . CESAREAN SECTION    . COLONOSCOPY    . EUS N/A 07/29/2015   Procedure: UPPER ENDOSCOPIC ULTRASOUND (EUS) LINEAR;  Surgeon: Milus Banister, MD;  Location: [facility];  Service:  Endoscopy;  Laterality: N/A;  . EXTERNAL FIXATION LEG Left 12/12/2012   Procedure: LEFT ADJUSTMENT OF EXTERNAL FIXATION UNDER ANESTHESIA;  Surgeon: Wylene Simmer, MD;  Location: [facility];  Service: Orthopedics;  Laterality: Left;  . LIVER BIOPSY    . ORIF ANKLE FRACTURE Left 12/03/2012  . ORIF ANKLE FRACTURE Left 01/02/2013   Procedure: LEFT ANKLE REMOVAL OF EXTERNAL FIXATOR OPEN REDUCTION INTERNAL FIXATION (ORIF) TRIMALLEOLAR FRACTURE;  Surgeon: Wylene Simmer, MD;  Location: [facility];  Service: Orthopedics;  Laterality: Left;  . OVARIAN CYST SURGERY     ruptured  . SHOULDER ARTHROSCOPY  2008   left  . TUBAL LIGATION     Family History  Problem Relation Age of Onset  . Liver cancer Mother    Social History   Occupational History  . Not on file.   Social History Main Topics  . Smoking status: Current Every Day Smoker    Packs/day: 0.25    Types: Cigarettes  . Smokeless tobacco: Never Used  . Alcohol use No  . Drug use: No  . Sexual activity: Not on file     Comment: trying to cut down    Tobacco Counseling Ready to quit: Not Answered Counseling given: Not Answered   Activities of Daily Living No flowsheet data found.  Immunizations and Health Maintenance Immunization History  Administered Date(s) Administered  . Influenza Split 01/04/2011  . Influenza Whole 01/10/2008, 05/31/2009, 11/30/2009  . Influenza,inj,Quad PF,36+ Mos 01/21/2014, 11/24/2015  . Pneumococcal Conjugate-13 07/23/2013  . Pneumococcal Polysaccharide-23 05/31/2009  . Td 04/27/2005  . Tdap 11/24/2015  . Zoster 05/29/2008   Health Maintenance Due  Topic Date Due  . Hepatitis C Screening  12-06-1945  . OPHTHALMOLOGY EXAM  05/30/1956  . FOOT EXAM  07/24/2014  . PNA vac Low Risk Adult (2 of 2 - PPSV23) 07/24/2014    Patient Care Team: Biagio Borg, MD as PCP - General  Indicate any recent Medical Services you may have received from other than [facility] providers in the  past year (date may be approximate).     Assessment:   This is a routine wellness examination for Crystal Thornton. Physical assessment deferred to PCP.   Hearing/Vision screen No exam data present  Dietary issues and exercise activities discussed:   Diet (meal preparation, eat out, water intake, caffeinated beverages, dairy products, fruits and vegetables): {Desc; diets:16563} Breakfast: Lunch:  Dinner:      Goals    None     Depression Screen PHQ 2/9 Scores 06/20/2016 11/24/2015 06/24/2015 07/23/2013  PHQ - 2 Score 0 0 0 1    Fall Risk Fall Risk  06/20/2016 11/24/2015 06/24/2015 07/23/2013  Falls in the past year? Yes No No Yes  Number falls in past yr: 1 - -  1  Injury with Fall? - - - Yes    Cognitive Function:        Screening Tests Health Maintenance  Topic Date Due  . Hepatitis C Screening  [birth year]  . OPHTHALMOLOGY EXAM  05/30/1956  . FOOT EXAM  07/24/2014  . PNA vac Low Risk Adult (2 of 2 - PPSV23) 07/24/2014  . INFLUENZA VACCINE  10/25/2016  . URINE MICROALBUMIN  11/16/2016  . HEMOGLOBIN A1C  12/09/2016  . MAMMOGRAM  01/06/2018  . COLONOSCOPY  07/17/2020  . TETANUS/TDAP  11/23/2025  . DEXA SCAN  Completed      Plan:     I have personally reviewed and noted the following in the patient's chart:   . Medical and social history . Use of alcohol, tobacco or illicit drugs  . Current medications and supplements . Functional ability and status . Nutritional status . Physical activity . Advanced directives . List of other physicians . Vitals . Screenings to include cognitive, depression, and falls . Referrals and appointments  In addition, I have reviewed and discussed with patient certain preventive protocols, quality metrics, and best practice recommendations. A written personalized care plan for preventive services as well as general preventive health recommendations were provided to patient.     Michiel Cowboy, RN   09/15/2016

## 2016-09-18 ENCOUNTER — Ambulatory Visit: Payer: Medicare HMO

## 2016-09-19 DIAGNOSIS — M5031 Other cervical disc degeneration,  high cervical region: Secondary | ICD-10-CM | POA: Diagnosis not present

## 2016-09-19 DIAGNOSIS — M542 Cervicalgia: Secondary | ICD-10-CM | POA: Diagnosis not present

## 2016-09-19 DIAGNOSIS — M5136 Other intervertebral disc degeneration, lumbar region: Secondary | ICD-10-CM | POA: Diagnosis not present

## 2016-09-19 DIAGNOSIS — G894 Chronic pain syndrome: Secondary | ICD-10-CM | POA: Diagnosis not present

## 2016-09-19 DIAGNOSIS — Z96651 Presence of right artificial knee joint: Secondary | ICD-10-CM | POA: Diagnosis not present

## 2016-09-19 DIAGNOSIS — M5033 Other cervical disc degeneration, cervicothoracic region: Secondary | ICD-10-CM | POA: Diagnosis not present

## 2016-09-19 DIAGNOSIS — M47816 Spondylosis without myelopathy or radiculopathy, lumbar region: Secondary | ICD-10-CM | POA: Diagnosis not present

## 2016-09-19 DIAGNOSIS — M792 Neuralgia and neuritis, unspecified: Secondary | ICD-10-CM | POA: Diagnosis not present

## 2016-09-19 DIAGNOSIS — M461 Sacroiliitis, not elsewhere classified: Secondary | ICD-10-CM | POA: Diagnosis not present

## 2016-09-19 DIAGNOSIS — M546 Pain in thoracic spine: Secondary | ICD-10-CM | POA: Diagnosis not present

## 2016-09-19 DIAGNOSIS — M791 Myalgia: Secondary | ICD-10-CM | POA: Diagnosis not present

## 2016-09-19 DIAGNOSIS — M5137 Other intervertebral disc degeneration, lumbosacral region: Secondary | ICD-10-CM | POA: Diagnosis not present

## 2016-09-20 ENCOUNTER — Other Ambulatory Visit: Payer: Self-pay | Admitting: Internal Medicine

## 2016-09-20 NOTE — Telephone Encounter (Signed)
Done hardcopy to [place]  

## 2016-09-20 NOTE — Telephone Encounter (Signed)
faxed

## 2016-10-17 DIAGNOSIS — M542 Cervicalgia: Secondary | ICD-10-CM | POA: Diagnosis not present

## 2016-10-17 DIAGNOSIS — M791 Myalgia: Secondary | ICD-10-CM | POA: Diagnosis not present

## 2016-10-17 DIAGNOSIS — Z79891 Long term (current) use of opiate analgesic: Secondary | ICD-10-CM | POA: Diagnosis not present

## 2016-10-17 DIAGNOSIS — M792 Neuralgia and neuritis, unspecified: Secondary | ICD-10-CM | POA: Diagnosis not present

## 2016-10-17 DIAGNOSIS — M461 Sacroiliitis, not elsewhere classified: Secondary | ICD-10-CM | POA: Diagnosis not present

## 2016-10-17 DIAGNOSIS — M545 Low back pain: Secondary | ICD-10-CM | POA: Diagnosis not present

## 2016-10-17 DIAGNOSIS — M5031 Other cervical disc degeneration,  high cervical region: Secondary | ICD-10-CM | POA: Diagnosis not present

## 2016-10-17 DIAGNOSIS — M5033 Other cervical disc degeneration, cervicothoracic region: Secondary | ICD-10-CM | POA: Diagnosis not present

## 2016-10-17 DIAGNOSIS — Z96651 Presence of right artificial knee joint: Secondary | ICD-10-CM | POA: Diagnosis not present

## 2016-10-17 DIAGNOSIS — M546 Pain in thoracic spine: Secondary | ICD-10-CM | POA: Diagnosis not present

## 2016-10-17 DIAGNOSIS — G894 Chronic pain syndrome: Secondary | ICD-10-CM | POA: Diagnosis not present

## 2016-10-17 DIAGNOSIS — M5136 Other intervertebral disc degeneration, lumbar region: Secondary | ICD-10-CM | POA: Diagnosis not present

## 2016-11-12 ENCOUNTER — Emergency Department (HOSPITAL_COMMUNITY): Admission: EM | Admit: 2016-11-12 | Discharge: 2016-11-12 | Discharge status: Absent without leave | Payer: Medicare HMO

## 2016-11-13 DIAGNOSIS — M792 Neuralgia and neuritis, unspecified: Secondary | ICD-10-CM | POA: Diagnosis not present

## 2016-11-13 DIAGNOSIS — M461 Sacroiliitis, not elsewhere classified: Secondary | ICD-10-CM | POA: Diagnosis not present

## 2016-11-13 DIAGNOSIS — M546 Pain in thoracic spine: Secondary | ICD-10-CM | POA: Diagnosis not present

## 2016-11-13 DIAGNOSIS — M791 Myalgia: Secondary | ICD-10-CM | POA: Diagnosis not present

## 2016-11-16 ENCOUNTER — Ambulatory Visit (INDEPENDENT_AMBULATORY_CARE_PROVIDER_SITE_OTHER): Payer: Medicare HMO | Admitting: Internal Medicine

## 2016-11-16 ENCOUNTER — Encounter: Payer: Self-pay | Admitting: Internal Medicine

## 2016-11-16 ENCOUNTER — Other Ambulatory Visit (INDEPENDENT_AMBULATORY_CARE_PROVIDER_SITE_OTHER): Payer: Medicare HMO

## 2016-11-16 ENCOUNTER — Ambulatory Visit (INDEPENDENT_AMBULATORY_CARE_PROVIDER_SITE_OTHER)
Admission: RE | Admit: 2016-11-16 | Discharge: 2016-11-16 | Disposition: A | Discharge status: Home / Self Care | Payer: Medicare HMO | Source: Ambulatory Visit | Attending: Internal Medicine | Admitting: Internal Medicine

## 2016-11-16 VITALS — BP 100/68 | HR 114 | Temp 98.6°F | Wt 148.6 lb

## 2016-11-16 DIAGNOSIS — J449 Chronic obstructive pulmonary disease, unspecified: Secondary | ICD-10-CM | POA: Insufficient documentation

## 2016-11-16 DIAGNOSIS — F32A Depression, unspecified: Secondary | ICD-10-CM

## 2016-11-16 DIAGNOSIS — Z0001 Encounter for general adult medical examination with abnormal findings: Secondary | ICD-10-CM

## 2016-11-16 DIAGNOSIS — Z114 Encounter for screening for human immunodeficiency virus [HIV]: Secondary | ICD-10-CM | POA: Diagnosis not present

## 2016-11-16 DIAGNOSIS — R05 Cough: Secondary | ICD-10-CM

## 2016-11-16 DIAGNOSIS — F17200 Nicotine dependence, unspecified, uncomplicated: Secondary | ICD-10-CM

## 2016-11-16 DIAGNOSIS — E785 Hyperlipidemia, unspecified: Secondary | ICD-10-CM

## 2016-11-16 DIAGNOSIS — J441 Chronic obstructive pulmonary disease with (acute) exacerbation: Secondary | ICD-10-CM

## 2016-11-16 DIAGNOSIS — R21 Rash and other nonspecific skin eruption: Secondary | ICD-10-CM | POA: Insufficient documentation

## 2016-11-16 DIAGNOSIS — Z1159 Encounter for screening for other viral diseases: Secondary | ICD-10-CM

## 2016-11-16 DIAGNOSIS — E119 Type 2 diabetes mellitus without complications: Secondary | ICD-10-CM

## 2016-11-16 DIAGNOSIS — F329 Major depressive disorder, single episode, unspecified: Secondary | ICD-10-CM

## 2016-11-16 DIAGNOSIS — I10 Essential (primary) hypertension: Secondary | ICD-10-CM

## 2016-11-16 DIAGNOSIS — Z23 Encounter for immunization: Secondary | ICD-10-CM | POA: Diagnosis not present

## 2016-11-16 DIAGNOSIS — R059 Cough, unspecified: Secondary | ICD-10-CM

## 2016-11-16 LAB — LIPID PANEL
CHOL/HDL RATIO: 4
Cholesterol: 165 mg/dL (ref 0–200)
HDL: 46.9 mg/dL (ref >39.00)
LDL CALC: 101 mg/dL — AB (ref 0–99)
NONHDL: 118.28
Triglycerides: 86 mg/dL (ref 0.0–149.0)
VLDL: 17.2 mg/dL (ref 0.0–40.0)

## 2016-11-16 LAB — CBC WITH DIFFERENTIAL/PLATELET
Basophils Absolute: 0 10*3/uL (ref 0.0–0.1)
Basophils Relative: 0.3 % (ref 0.0–3.0)
EOS PCT: 1.9 % (ref 0.0–5.0)
Eosinophils Absolute: 0.2 10*3/uL (ref 0.0–0.7)
HEMATOCRIT: 38.3 % (ref 36.0–46.0)
HEMOGLOBIN: 12.5 g/dL (ref 12.0–15.0)
LYMPHS PCT: 10.9 % — AB (ref 12.0–46.0)
Lymphs Abs: 1.2 10*3/uL (ref 0.7–4.0)
MCHC: 32.5 g/dL (ref 30.0–36.0)
MCV: 91.7 fl (ref 78.0–100.0)
MONOS PCT: 8.9 % (ref 3.0–12.0)
Monocytes Absolute: 0.9 10*3/uL (ref 0.1–1.0)
NEUTROS ABS: 8.2 10*3/uL — AB (ref 1.4–7.7)
Neutrophils Relative %: 78 % — ABNORMAL HIGH (ref 43.0–77.0)
PLATELETS: 384 10*3/uL (ref 150.0–400.0)
RBC: 4.18 Mil/uL (ref 3.87–5.11)
RDW: 14.1 % (ref 11.5–15.5)
WBC: 10.5 10*3/uL (ref 4.0–10.5)

## 2016-11-16 LAB — HEPATIC FUNCTION PANEL
ALT: 32 U/L (ref 0–35)
AST: 20 U/L (ref 0–37)
Albumin: 3.7 g/dL (ref 3.5–5.2)
Alkaline Phosphatase: 138 U/L — ABNORMAL HIGH (ref 39–117)
BILIRUBIN DIRECT: 0.2 mg/dL (ref 0.0–0.3)
BILIRUBIN TOTAL: 0.3 mg/dL (ref 0.2–1.2)
Total Protein: 7.7 g/dL (ref 6.0–8.3)

## 2016-11-16 LAB — BASIC METABOLIC PANEL
BUN: 16 mg/dL (ref 6–23)
CALCIUM: 9.7 mg/dL (ref 8.4–10.5)
CHLORIDE: 98 meq/L (ref 96–112)
CO2: 37 mEq/L — ABNORMAL HIGH (ref 19–32)
CREATININE: 0.73 mg/dL (ref 0.40–1.20)
GFR: 83.66 mL/min (ref >60.00)
Glucose, Bld: 154 mg/dL — ABNORMAL HIGH (ref 70–99)
Potassium: 4.4 mEq/L (ref 3.5–5.1)
Sodium: 139 mEq/L (ref 135–145)

## 2016-11-16 LAB — TSH: TSH: 0.77 u[IU]/mL (ref 0.35–4.50)

## 2016-11-16 LAB — URINALYSIS, ROUTINE W REFLEX MICROSCOPIC
BILIRUBIN URINE: NEGATIVE
HGB URINE DIPSTICK: NEGATIVE
Ketones, ur: NEGATIVE
Leukocytes, UA: NEGATIVE
NITRITE: NEGATIVE
Specific Gravity, Urine: 1.02 (ref 1.000–1.030)
Urine Glucose: >=1000 — AB
Urobilinogen, UA: 0.2 (ref 0.0–1.0)
pH: 5.5 (ref 5.0–8.0)

## 2016-11-16 LAB — HEMOGLOBIN A1C: Hgb A1c MFr Bld: 7.7 % — ABNORMAL HIGH (ref 4.6–6.5)

## 2016-11-16 LAB — MICROALBUMIN / CREATININE URINE RATIO
CREATININE, U: 129.5 mg/dL
MICROALB UR: 7.2 mg/dL — AB (ref 0.0–1.9)
Microalb Creat Ratio: 5.6 mg/g (ref 0.0–30.0)

## 2016-11-16 LAB — HEPATITIS C ANTIBODY: HCV Ab: REACTIVE — AB

## 2016-11-16 MED ORDER — PREDNISONE 10 MG PO TABS: ORAL_TABLET | ORAL | 0 refills | Status: DC

## 2016-11-16 MED ORDER — SOLIFENACIN SUCCINATE 5 MG PO TABS: 5.0000 mg | ORAL_TABLET | Freq: Every day | ORAL | 3 refills | Status: DC

## 2016-11-16 MED ORDER — HYDROCODONE-HOMATROPINE 5-1.5 MG/5ML PO SYRP: 5.0000 mL | ORAL_SOLUTION | Freq: Four times a day (QID) | ORAL | 0 refills | Status: DC | PRN

## 2016-11-16 MED ORDER — METHYLPREDNISOLONE ACETATE 80 MG/ML IJ SUSP
80.0000 mg | Freq: Once | INTRAMUSCULAR | Status: AC
  Administered 2016-11-16: 80 mg via INTRAMUSCULAR

## 2016-11-16 MED ORDER — ALBUTEROL SULFATE HFA 108 (90 BASE) MCG/ACT IN AERS: 2.0000 | INHALATION_SPRAY | Freq: Four times a day (QID) | RESPIRATORY_TRACT | 2 refills | Status: DC | PRN

## 2016-11-16 MED ORDER — LEVOFLOXACIN 250 MG PO TABS: 250.0000 mg | ORAL_TABLET | Freq: Every day | ORAL | 0 refills | Status: AC

## 2016-11-16 NOTE — Patient Instructions (Addendum)
You had the flu shot today, and the steroid shot  Please take all new medication as prescribed - the vesicare  Please take all new medication as prescribed - the antibiotic, cough medicine if needed, prednisone, and the inhaler  Please continue all other medications as before, and refills have been done if requested.  Please have the pharmacy call with any other refills you may need.  Please continue your efforts at being more active, low cholesterol diet, and weight control.  You are otherwise up to date with prevention measures today.  Please keep your appointments with your specialists as you may have planned  You will be contacted regarding the referral for: Eye doctor, dermatology and pulmonary  Please go to the XRAY Department in the Basement (go straight as you get off the elevator) for the x-ray testing  Please go to the LAB in the Basement (turn left off the elevator) for the tests to be done today  You will be contacted by phone if any changes need to be made immediately.  Otherwise, you will receive a letter about your results with an explanation, but please check with MyChart first.  Please remember to sign up for MyChart if you have not done so, as this will be important to you in the future with finding out test results, communicating by private email, and scheduling acute appointments online when needed.  Please return in 6 months, or sooner if needed, with Lab testing done 3-5 days before
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
## 2016-11-17 LAB — HIV ANTIBODY (ROUTINE TESTING W REFLEX): HIV 1&2 Ab, 4th Generation: NONREACTIVE

## 2016-11-19 MED ORDER — SERTRALINE HCL 100 MG PO TABS: 200.0000 mg | ORAL_TABLET | Freq: Every day | ORAL | 3 refills | Status: DC

## 2016-11-19 MED ORDER — SOLIFENACIN SUCCINATE 5 MG PO TABS: 5.0000 mg | ORAL_TABLET | Freq: Every day | ORAL | 3 refills | Status: DC

## 2016-11-19 MED ORDER — METFORMIN HCL 500 MG PO TABS: ORAL_TABLET | ORAL | 3 refills | Status: DC

## 2016-11-19 MED ORDER — ROSUVASTATIN CALCIUM 40 MG PO TABS: 40.0000 mg | ORAL_TABLET | Freq: Every day | ORAL | 3 refills | Status: DC

## 2016-11-19 MED ORDER — ATENOLOL 50 MG PO TABS: 50.0000 mg | ORAL_TABLET | Freq: Every day | ORAL | 3 refills | Status: DC

## 2016-11-19 NOTE — Assessment & Plan Note (Signed)
Pt with more irritability last few days, to restart med as prior,  to f/u any worsening symptoms or concerns

## 2016-11-19 NOTE — Assessment & Plan Note (Signed)
Encouraged to quit. 

## 2016-11-19 NOTE — Assessment & Plan Note (Addendum)
Mild to mod, c/w bronchitis vs pna, for cxr,  for antibx course, depomedrol IM, predpac asd, cough med prn, cont inhaler,  to f/u any worsening symptoms or concerns, for pulm referral per pt request  In addition to the time spent performing CPE, I spent an additional 25 minutes face to face,in which greater than 50% of this time was spent in counseling and coordination of care for patient's acute illness as documented, including the differential dx, tx, further evaluation and other management of copd exacerbation, cough, DM, HTN, HLD, rash to arms, depression, and counseling to quit smoking > 3 min

## 2016-11-19 NOTE — Assessment & Plan Note (Signed)
For cxr as above, tx as per "copd exacerbation"

## 2016-11-19 NOTE — Assessment & Plan Note (Signed)

## 2016-11-19 NOTE — Assessment & Plan Note (Signed)
?   Etiology, for triam cr, refer derm per pt request

## 2016-11-19 NOTE — Assessment & Plan Note (Signed)
stable overall by history and exam, recent data reviewed with pt, and pt to continue medical treatment as before,  to f/u any worsening symptoms or concerns Lab Results  Component Value Date   LDLCALC 101 (H) 11/16/2016

## 2016-11-19 NOTE — Assessment & Plan Note (Signed)
stable overall by history and exam, recent data reviewed with pt, and pt to continue medical treatment as before,  to f/u any worsening symptoms or concerns BP Readings from Last 3 Encounters:  11/16/16 100/68  06/20/16 122/82  11/24/15 (!) 144/82

## 2016-11-19 NOTE — Assessment & Plan Note (Signed)
stable overall by history and exam, recent data reviewed with pt, and pt to continue medical treatment as before,  to f/u any worsening symptoms or concerns Lab Results  Component Value Date   HGBA1C 7.7 (H) 11/16/2016

## 2016-11-20 LAB — HEPATITIS C RNA QUANTITATIVE
HCV QUANT: NOT DETECTED [IU]/mL
HCV Quantitative Log: <1.18 Log IU/mL

## 2016-11-23 ENCOUNTER — Telehealth: Payer: Self-pay | Admitting: Internal Medicine

## 2016-11-23 MED ORDER — HYDROCODONE-HOMATROPINE 5-1.5 MG/5ML PO SYRP: 5.0000 mL | ORAL_SOLUTION | Freq: Four times a day (QID) | ORAL | 0 refills | Status: AC | PRN

## 2016-11-23 NOTE — Telephone Encounter (Signed)
Informed pt Script at front desk  

## 2016-11-23 NOTE — Telephone Encounter (Signed)
Ok this time only, but cannot do ongoing refills after this, thakns  Done hardcopy to [employer]

## 2016-11-23 NOTE — Telephone Encounter (Signed)
Patient would like to know if she can get another script for hydrocodone cough syrup?

## 2016-12-08 DIAGNOSIS — L03116 Cellulitis of left lower limb: Secondary | ICD-10-CM | POA: Diagnosis not present

## 2016-12-08 DIAGNOSIS — S70362A Insect bite (nonvenomous), left thigh, initial encounter: Secondary | ICD-10-CM | POA: Diagnosis not present

## 2016-12-11 DIAGNOSIS — M546 Pain in thoracic spine: Secondary | ICD-10-CM | POA: Diagnosis not present

## 2016-12-11 DIAGNOSIS — G894 Chronic pain syndrome: Secondary | ICD-10-CM | POA: Diagnosis not present

## 2016-12-11 DIAGNOSIS — Z79891 Long term (current) use of opiate analgesic: Secondary | ICD-10-CM | POA: Diagnosis not present

## 2016-12-11 DIAGNOSIS — M791 Myalgia: Secondary | ICD-10-CM | POA: Diagnosis not present

## 2016-12-12 ENCOUNTER — Ambulatory Visit (INDEPENDENT_AMBULATORY_CARE_PROVIDER_SITE_OTHER): Payer: Medicare HMO | Admitting: Emergency Medicine

## 2016-12-12 ENCOUNTER — Encounter: Payer: Self-pay | Admitting: Emergency Medicine

## 2016-12-12 VITALS — BP 102/72 | HR 72 | Ht 67.0 in | Wt 142.0 lb

## 2016-12-12 DIAGNOSIS — R911 Solitary pulmonary nodule: Secondary | ICD-10-CM

## 2016-12-12 DIAGNOSIS — J449 Chronic obstructive pulmonary disease, unspecified: Secondary | ICD-10-CM | POA: Diagnosis not present

## 2016-12-12 DIAGNOSIS — F17200 Nicotine dependence, unspecified, uncomplicated: Secondary | ICD-10-CM

## 2016-12-12 MED ORDER — TIOTROPIUM BROMIDE MONOHYDRATE 18 MCG IN CAPS: 18.0000 ug | ORAL_CAPSULE | Freq: Every day | RESPIRATORY_TRACT | 5 refills | Status: DC

## 2016-12-12 NOTE — Progress Notes (Signed)
Subjective:    Patient ID: Crystal Thornton, female    DOB: [birth year], 71 y.o.   MRN: [id number]  HPI 71 year old active smoker (50 pack years), with a history of treated hepatitis C, hypertension, DM, depression, seizure disorder. Probable COPD. She is referred today for COPD management following a recent AE that was characterized by freq cough, colored sputum, wheeze, some dyspnea. Was treated with pred, albuterol, abx. She improved, is now close to baseline. She still has the albuterol - it helps her breathing when she uses it.    Review of Systems  Constitutional: Negative for fever and unexpected weight change.  HENT: Negative for congestion, dental problem, ear pain, nosebleeds, postnasal drip, rhinorrhea, sinus pressure, sneezing, sore throat and trouble swallowing.   Eyes: Negative for redness and itching.  Respiratory: Positive for cough and wheezing. Negative for chest tightness and shortness of breath.   Cardiovascular: Negative for palpitations and leg swelling.  Gastrointestinal: Negative for nausea and vomiting.  Genitourinary: Negative for dysuria.  Musculoskeletal: Negative for joint swelling.  Skin: Negative for rash.  Neurological: Negative for headaches.  Hematological: Does not bruise/bleed easily.  Psychiatric/Behavioral: Negative for dysphoric mood. The patient is not nervous/anxious.     Past Medical History:  Diagnosis Date  . ANGIOEDEMA 01/24/2010  . Anxiety   . ANXIETY 11/07/2006  . BACK PAIN 11/01/2007  . Cough 05/29/2008  . CYST, OVARIAN NEC/NOS 11/05/2006  . Depression   . DEPRESSION 11/07/2006  . Diabetes mellitus   . DIABETES MELLITUS, TYPE II 11/07/2006  . DIVERTICULOSIS, COLON 11/07/2006  . FIBROCYSTIC BREAST DISEASE 11/05/2006  . HEAD TRAUMA, CLOSED 01/24/2010  . HEPATITIS B, HX OF 11/05/2006  . Hepatitis C    Treated and no longer has  . HEPATITIS C 11/05/2006  . Hyperlipidemia   . HYPERLIPIDEMIA 11/07/2006  . Hypertension   . HYPERTENSION 05/29/2007    . INSOMNIA, HX OF 11/05/2006  . LOW BACK PAIN 05/29/2007  . Osteopenia   . OSTEOPENIA 11/07/2006  . Other specified sites of sprains and strains 08/31/2008  . Pain    R. shoulder and lower back and legs  . PULMONARY NODULE, LEFT UPPER LOBE 05/31/2009  . Seizures ([place])   . SHOULDER PAIN, RIGHT, CHRONIC 06/02/2010  . SINUSITIS- ACUTE-NOS 12/10/2007  . SKIN LESION 06/02/2010  . TOTAL ABDOMINAL HYSTERECTOMY, HX OF 03/27/1990  . Wears dentures    full top  . Wears glasses      Family History  Problem Relation Age of Onset  . Liver cancer Mother      Social History   Social History  . Marital status: Divorced    Spouse name: N/A  . Number of children: N/A  . Years of education: N/A   Occupational History  . Not on file.   Social History Main Topics  . Smoking status: Current Every Day Smoker    Packs/day: 1.00    Years: 50.00    Types: Cigarettes  . Smokeless tobacco: Never Used  . Alcohol use No  . Drug use: No  . Sexual activity: Not on file     Comment: trying to cut down   Other Topics Concern  . Not on file   Social History Narrative  . No narrative on file  She worked as a Radio broadcast assistant, office job Lived in [place], [place]  Allergies  Allergen Reactions  . Naproxen Sodium Anaphylaxis  . Sulfonamide Derivatives Other (See Comments)    Causes nephritis  . Ace Inhibitors Swelling  Lip swelling     Outpatient Medications Prior to Visit  Medication Sig Dispense Refill  . albuterol (PROVENTIL HFA;VENTOLIN HFA) 108 (90 Base) MCG/ACT inhaler Inhale 2 puffs into the lungs every 6 (six) hours as needed for wheezing or shortness of breath. 1 Inhaler 2  . aspirin EC 325 MG tablet Take 325 mg by mouth daily.    Marland Kitchen atenolol (TENORMIN) 50 MG tablet Take 1 tablet (50 mg total) by mouth daily. 90 tablet 3  . B Complex-C (B-COMPLEX WITH VITAMIN C) tablet Take 1 tablet by mouth daily.    . clonazePAM (KLONOPIN) 1 MG tablet TAKE 1 TABLET BY MOUTH TWICE DAILY AS NEEDED FOR ANXIETY 60  tablet 2  . cyclobenzaprine (FLEXERIL) 10 MG tablet Take 10 mg by mouth 3 (three) times daily as needed for muscle spasms.     Marland Kitchen GINKGO BILOBA COMPLEX PO Take 1 tablet by mouth daily.    . Ginseng 500 MG CAPS Take 1 capsule by mouth daily.    . metFORMIN (GLUCOPHAGE) 500 MG tablet TAKE 2 TABLETS BY MOUTH DAILY WITH BREAKFAST 180 tablet 3  . Multiple Vitamins-Minerals (CENTRUM SILVER PO) Take 1 tablet by mouth daily.      Marland Kitchen oxycodone (ROXICODONE) 30 MG immediate release tablet 30 mg 5 times daily as needed for pain. 20 tablet 0  . rosuvastatin (CRESTOR) 40 MG tablet Take 1 tablet (40 mg total) by mouth daily. 90 tablet 3  . sertraline (ZOLOFT) 100 MG tablet Take 2 tablets (200 mg total) by mouth daily. Yearly physical w/labs due in August 180 tablet 3  . solifenacin (VESICARE) 5 MG tablet Take 1 tablet (5 mg total) by mouth daily. 90 tablet 3  . predniSONE (DELTASONE) 10 MG tablet 3 tabs by mouth per day for 3 days,2tabs per day for 3 days,1tab per day for 3 days 18 tablet 0   No facility-administered medications prior to visit.         Objective:   Physical Exam  Vitals:   12/12/16 0927 12/12/16 0930  BP:  102/72  Pulse:  72  SpO2:  92%  Weight: 142 lb (64.4 kg)   Height: 5\' 7"  (1.702 m)    Gen: Pleasant, Thin woman, in no distress,  normal affect  ENT: No lesions,  mouth clear,  oropharynx clear, no postnasal drip, somewhat hoarse voice  Neck: No JVD, no stridor  Lungs: No use of accessory muscles, distant, some scattered inspiratory crackles, no wheezing  Cardiovascular: RRR, heart sounds normal, no murmur or gallops, no peripheral edema  Musculoskeletal: No deformities, no cyanosis or clubbing  Neuro: alert, non focal  Skin: Warm, no lesions or rashes     Assessment & Plan:  COPD (chronic obstructive pulmonary disease) ([place]) Presumed diagnosis based on her overall history and also recent history of COPD exacerbation. She is returning to baseline. She takes albuterol  and feels that it does help her overall breathing. We need to perform pulmonary function testing to quantify degree of obstruction. In the meantime we will start Spiriva to see if she benefits. We will fill out financial assistance paperwork to make this more affordable. Continue albuterol as needed. Her flu shot is up-to-date. Discussed smoking cessation as below. Walking oximetry to ensure that she does not desaturate.  Pulmonary nodule, left Resolved on serial CT scans, last 2011  Smoker Discussed cessation with her in detail today. She is motivated to cut down and ultimately quit. She is not ready or in a position to  set a quit date currently. She tells me that she has tried Chantix before but did not tolerate. For now we will set a goal of 15 cigarettes daily. We talked about rationing.   Baltazar Apo, MD, PhD 12/12/2016, 10:05 AM [facility] [phone] or if no answer [phone]

## 2016-12-12 NOTE — Assessment & Plan Note (Signed)
Presumed diagnosis based on her overall history and also recent history of COPD exacerbation. She is returning to baseline. She takes albuterol and feels that it does help her overall breathing. We need to perform pulmonary function testing to quantify degree of obstruction. In the meantime we will start Spiriva to see if she benefits. We will fill out financial assistance paperwork to make this more affordable. Continue albuterol as needed. Her flu shot is up-to-date. Discussed smoking cessation as below. Walking oximetry to ensure that she does not desaturate.

## 2016-12-12 NOTE — Patient Instructions (Addendum)
We will perform full pulmonary function testing  Please start taking Spiriva one inhalation once a day, every day.  Keep Ventolin available to take 2 puffs up to every 4 hours if needed for shortness of breath.  Walking oximetry on room air today.  We will work on decreasing your cigarettes. We set a goal today of 15 cigarettes a day.  Follow with Dr Lamonte Sakai next available with full PFT same day

## 2016-12-12 NOTE — Assessment & Plan Note (Signed)
Discussed cessation with her in detail today. She is motivated to cut down and ultimately quit. She is not ready or in a position to set a quit date currently. She tells me that she has tried Chantix before but did not tolerate. For now we will set a goal of 15 cigarettes daily. We talked about rationing.

## 2016-12-12 NOTE — Assessment & Plan Note (Signed)
Resolved on serial CT scans, last 2011

## 2016-12-18 ENCOUNTER — Other Ambulatory Visit: Payer: Self-pay | Admitting: Internal Medicine

## 2016-12-19 NOTE — Telephone Encounter (Signed)
Done hardcopy to [place]  

## 2016-12-19 NOTE — Telephone Encounter (Signed)
faxed

## 2016-12-21 ENCOUNTER — Telehealth: Payer: Self-pay | Admitting: Internal Medicine

## 2016-12-21 ENCOUNTER — Ambulatory Visit: Payer: Self-pay | Admitting: Internal Medicine

## 2016-12-21 ENCOUNTER — Other Ambulatory Visit (INDEPENDENT_AMBULATORY_CARE_PROVIDER_SITE_OTHER): Payer: Medicare HMO

## 2016-12-21 DIAGNOSIS — Z0000 Encounter for general adult medical examination without abnormal findings: Secondary | ICD-10-CM

## 2016-12-21 DIAGNOSIS — R21 Rash and other nonspecific skin eruption: Secondary | ICD-10-CM | POA: Diagnosis not present

## 2016-12-21 DIAGNOSIS — W57XXXA Bitten or stung by nonvenomous insect and other nonvenomous arthropods, initial encounter: Secondary | ICD-10-CM

## 2016-12-21 DIAGNOSIS — E119 Type 2 diabetes mellitus without complications: Secondary | ICD-10-CM | POA: Diagnosis not present

## 2016-12-21 LAB — URINALYSIS, ROUTINE W REFLEX MICROSCOPIC
BILIRUBIN URINE: NEGATIVE
HGB URINE DIPSTICK: NEGATIVE
KETONES UR: NEGATIVE
Nitrite: NEGATIVE
RBC / HPF: NONE SEEN (ref 0–?)
Specific Gravity, Urine: 1.02 (ref 1.000–1.030)
Total Protein, Urine: NEGATIVE
UROBILINOGEN UA: 0.2 (ref 0.0–1.0)
Urine Glucose: 500 — AB
pH: 7 (ref 5.0–8.0)

## 2016-12-21 LAB — BASIC METABOLIC PANEL
BUN: 24 mg/dL — ABNORMAL HIGH (ref 6–23)
CHLORIDE: 103 meq/L (ref 96–112)
CO2: 31 mEq/L (ref 19–32)
CREATININE: 0.77 mg/dL (ref 0.40–1.20)
Calcium: 9.2 mg/dL (ref 8.4–10.5)
GFR: 78.64 mL/min (ref >60.00)
Glucose, Bld: 278 mg/dL — ABNORMAL HIGH (ref 70–99)
Potassium: 4.4 mEq/L (ref 3.5–5.1)
SODIUM: 139 meq/L (ref 135–145)

## 2016-12-21 LAB — CBC WITH DIFFERENTIAL/PLATELET
BASOS ABS: 0 10*3/uL (ref 0.0–0.1)
Basophils Relative: 0.4 % (ref 0.0–3.0)
EOS PCT: 2.9 % (ref 0.0–5.0)
Eosinophils Absolute: 0.2 10*3/uL (ref 0.0–0.7)
HEMATOCRIT: 39.8 % (ref 36.0–46.0)
Hemoglobin: 13 g/dL (ref 12.0–15.0)
LYMPHS PCT: 16.1 % (ref 12.0–46.0)
Lymphs Abs: 1.3 10*3/uL (ref 0.7–4.0)
MCHC: 32.7 g/dL (ref 30.0–36.0)
MCV: 91.6 fl (ref 78.0–100.0)
MONOS PCT: 7.8 % (ref 3.0–12.0)
Monocytes Absolute: 0.6 10*3/uL (ref 0.1–1.0)
NEUTROS ABS: 5.9 10*3/uL (ref 1.4–7.7)
Neutrophils Relative %: 72.8 % (ref 43.0–77.0)
Platelets: 197 10*3/uL (ref 150.0–400.0)
RBC: 4.35 Mil/uL (ref 3.87–5.11)
RDW: 15.1 % (ref 11.5–15.5)
WBC: 8.1 10*3/uL (ref 4.0–10.5)

## 2016-12-21 LAB — HEPATIC FUNCTION PANEL
ALK PHOS: 147 U/L — AB (ref 39–117)
ALT: 28 U/L (ref 0–35)
AST: 26 U/L (ref 0–37)
Albumin: 3.6 g/dL (ref 3.5–5.2)
BILIRUBIN DIRECT: 0 mg/dL (ref 0.0–0.3)
BILIRUBIN TOTAL: 0.3 mg/dL (ref 0.2–1.2)
Total Protein: 6.4 g/dL (ref 6.0–8.3)

## 2016-12-21 LAB — MICROALBUMIN / CREATININE URINE RATIO
CREATININE, U: 82.4 mg/dL
MICROALB UR: 1.5 mg/dL (ref 0.0–1.9)
MICROALB/CREAT RATIO: 1.8 mg/g (ref 0.0–30.0)

## 2016-12-21 LAB — LIPID PANEL
CHOL/HDL RATIO: 2
Cholesterol: 106 mg/dL (ref 0–200)
HDL: 54.1 mg/dL (ref >39.00)
LDL Cholesterol: 37 mg/dL (ref 0–99)
NONHDL: 51.95
Triglycerides: 76 mg/dL (ref 0.0–149.0)
VLDL: 15.2 mg/dL (ref 0.0–40.0)

## 2016-12-21 LAB — HEMOGLOBIN A1C: Hgb A1c MFr Bld: 8.2 % — ABNORMAL HIGH (ref 4.6–6.5)

## 2016-12-21 LAB — TSH: TSH: 1.4 u[IU]/mL (ref 0.35–4.50)

## 2016-12-21 NOTE — Telephone Encounter (Signed)
Ok this lab is added

## 2016-12-21 NOTE — Telephone Encounter (Signed)
LVM informing patient.

## 2016-12-21 NOTE — Telephone Encounter (Signed)
Pt called stating she was biten by a tick 2 weeks ago and she now has a rash around the bite. She would like to be tested for lymne disease when she come in on 10/1, Please advise

## 2016-12-23 ENCOUNTER — Other Ambulatory Visit: Payer: Self-pay | Admitting: Internal Medicine

## 2016-12-23 LAB — HGE(IGG/M)+LYMEAB(IGM)+RKYIGM
HGE IgG Titer: NEGATIVE
HGE IgM Titer: NEGATIVE
RMSF IgM: 1.79 index — ABNORMAL HIGH (ref 0.00–0.89)

## 2016-12-23 MED ORDER — DOXYCYCLINE HYCLATE 100 MG PO TABS: 100.0000 mg | ORAL_TABLET | Freq: Two times a day (BID) | ORAL | 0 refills | Status: DC

## 2016-12-25 ENCOUNTER — Ambulatory Visit (INDEPENDENT_AMBULATORY_CARE_PROVIDER_SITE_OTHER): Payer: Medicare HMO | Admitting: Internal Medicine

## 2016-12-25 ENCOUNTER — Telehealth: Payer: Self-pay

## 2016-12-25 ENCOUNTER — Encounter: Payer: Self-pay | Admitting: Internal Medicine

## 2016-12-25 VITALS — BP 136/86 | HR 74 | Temp 97.8°F | Ht 67.0 in | Wt 145.0 lb

## 2016-12-25 DIAGNOSIS — I10 Essential (primary) hypertension: Secondary | ICD-10-CM

## 2016-12-25 DIAGNOSIS — A770 Spotted fever due to Rickettsia rickettsii: Secondary | ICD-10-CM | POA: Diagnosis not present

## 2016-12-25 DIAGNOSIS — Z0001 Encounter for general adult medical examination with abnormal findings: Secondary | ICD-10-CM

## 2016-12-25 DIAGNOSIS — B1710 Acute hepatitis C without hepatic coma: Secondary | ICD-10-CM

## 2016-12-25 DIAGNOSIS — E119 Type 2 diabetes mellitus without complications: Secondary | ICD-10-CM

## 2016-12-25 DIAGNOSIS — E2839 Other primary ovarian failure: Secondary | ICD-10-CM | POA: Diagnosis not present

## 2016-12-25 DIAGNOSIS — E785 Hyperlipidemia, unspecified: Secondary | ICD-10-CM

## 2016-12-25 MED ORDER — DULAGLUTIDE 0.75 MG/0.5ML ~~LOC~~ SOAJ: SUBCUTANEOUS | 3 refills | Status: DC

## 2016-12-25 MED ORDER — IVERMECTIN 0.5 % EX LOTN: TOPICAL_LOTION | CUTANEOUS | 0 refills | Status: DC

## 2016-12-25 MED ORDER — METFORMIN HCL 500 MG PO TABS: ORAL_TABLET | ORAL | 3 refills | Status: DC

## 2016-12-25 NOTE — Assessment & Plan Note (Signed)

## 2016-12-25 NOTE — Telephone Encounter (Signed)
-----   Message from Biagio Borg, MD sent at 12/23/2016  3:40 PM EDT ----- Left message on MyChart, pt to cont same tx except  The test results show that your current treatment is OK, except the test for [facility] Fever is abnormal. We need to treat for infection with an antibiotic that I will send to your pharmacy. Redmond Baseman to please inform pt, I will do rx

## 2016-12-25 NOTE — Progress Notes (Signed)
Subjective:    Patient ID: Crystal Thornton, female    DOB: 03/15/1946, 71 y.o.   MRN: [id number]  HPI  Here for wellness and f/u;  Overall doing ok;  Pt denies Chest pain, worsening SOB, DOE, wheezing, orthopnea, PND, worsening LE edema, palpitations, dizziness or syncope.  Pt denies neurological change such as new headache, facial or extremity weakness.  Pt states overall good compliance with treatment and medications, good tolerability, and has been trying to follow appropriate diet.  Pt denies worsening depressive symptoms, suicidal ideation or panic. No fever, night sweats, wt loss, loss of appetite, or other constitutional symptoms.  Pt states good ability with ADL's, has low fall risk, home safety reviewed and adequate, no other significant changes in hearing or vision,  Has seen Dr Lamonte Sakai, who has suggested she on purpose limit herself to 15 cigs per day.  Thinks she may be able to cut back more soon.  Did have left medial thigh red with white surrounding area, swelling, tender.  Seen at [facility] and now just finished 10 days doxycycline already.  Has appt with optho soon as has not seen recently.  Mentions her granddaughter has lice and she is thinking she needs tx as well.   Has overall been less active recently and a1c is increased.  Pt denies polydipsia, polyuria, or low sugar symptoms such as weakness or confusion improved with po intake.  Pt states overall good compliance with meds, trying to follow lower cholesterol, diabetic diet, wt overall stable but little exercise however.    Wt Readings from Last 3 Encounters:  12/25/16 145 lb (65.8 kg)  12/12/16 142 lb (64.4 kg)  11/16/16 148 lb 9.6 oz (67.4 kg)   BP Readings from Last 3 Encounters:  12/25/16 136/86  12/12/16 102/72  11/16/16 100/68    Past Medical History:  Diagnosis Date  . ANGIOEDEMA 01/24/2010  . Anxiety   . ANXIETY 11/07/2006  . BACK PAIN 11/01/2007  . Cough 05/29/2008  . CYST, OVARIAN NEC/NOS 11/05/2006  . Depression   .  DEPRESSION 11/07/2006  . Diabetes mellitus   . DIABETES MELLITUS, TYPE II 11/07/2006  . DIVERTICULOSIS, COLON 11/07/2006  . FIBROCYSTIC BREAST DISEASE 11/05/2006  . HEAD TRAUMA, CLOSED 01/24/2010  . HEPATITIS B, HX OF 11/05/2006  . Hepatitis C    Treated and no longer has  . HEPATITIS C 11/05/2006  . Hyperlipidemia   . HYPERLIPIDEMIA 11/07/2006  . Hypertension   . HYPERTENSION 05/29/2007  . INSOMNIA, HX OF 11/05/2006  . LOW BACK PAIN 05/29/2007  . Osteopenia   . OSTEOPENIA 11/07/2006  . Other specified sites of sprains and strains 08/31/2008  . Pain    R. shoulder and lower back and legs  . PULMONARY NODULE, LEFT UPPER LOBE 05/31/2009  . Seizures ([place])   . SHOULDER PAIN, RIGHT, CHRONIC 06/02/2010  . SINUSITIS- ACUTE-NOS 12/10/2007  . SKIN LESION 06/02/2010  . TOTAL ABDOMINAL HYSTERECTOMY, HX OF 03/27/1990  . Wears dentures    full top  . Wears glasses    Past Surgical History:  Procedure Laterality Date  . APPENDECTOMY    . CERVICAL FUSION  2013  . CESAREAN SECTION    . COLONOSCOPY    . EUS N/A 07/29/2015   Procedure: UPPER ENDOSCOPIC ULTRASOUND (EUS) LINEAR;  Surgeon: Milus Banister, MD;  Location: [facility];  Service: Endoscopy;  Laterality: N/A;  . EXTERNAL FIXATION LEG Left 12/12/2012   Procedure: LEFT ADJUSTMENT OF EXTERNAL FIXATION UNDER ANESTHESIA;  Surgeon: Wylene Simmer,  MD;  Location: [facility];  Service: Orthopedics;  Laterality: Left;  . LIVER BIOPSY    . ORIF ANKLE FRACTURE Left 12/03/2012  . ORIF ANKLE FRACTURE Left 01/02/2013   Procedure: LEFT ANKLE REMOVAL OF EXTERNAL FIXATOR OPEN REDUCTION INTERNAL FIXATION (ORIF) TRIMALLEOLAR FRACTURE;  Surgeon: Wylene Simmer, [place];  Location: [facility];  Service: Orthopedics;  Laterality: Left;  . OVARIAN CYST SURGERY     ruptured  . SHOULDER ARTHROSCOPY  2008   left  . TUBAL LIGATION      reports that she has been smoking Cigarettes.  She has a 50.00 pack-year smoking history. She has never used smokeless  tobacco. She reports that she does not drink alcohol or use drugs. family history includes Liver cancer in her mother. Allergies  Allergen Reactions  . Naproxen Sodium Anaphylaxis  . Sulfonamide Derivatives Other (See Comments)    Causes nephritis  . Ace Inhibitors Swelling    Lip swelling   Current Outpatient Prescriptions on File Prior to Visit  Medication Sig Dispense Refill  . albuterol (PROVENTIL HFA;VENTOLIN HFA) 108 (90 Base) MCG/ACT inhaler Inhale 2 puffs into the lungs every 6 (six) hours as needed for wheezing or shortness of breath. 1 Inhaler 2  . aspirin EC 325 MG tablet Take 325 mg by mouth daily.    Marland Kitchen atenolol (TENORMIN) 50 MG tablet Take 1 tablet (50 mg total) by mouth daily. 90 tablet 3  . B Complex-C (B-COMPLEX WITH VITAMIN C) tablet Take 1 tablet by mouth daily.    . clonazePAM (KLONOPIN) 1 MG tablet TAKE 1 TABLET BY MOUTH TWICE DAILY AS NEEDED FOR ANXIETY 60 tablet 2  . cyclobenzaprine (FLEXERIL) 10 MG tablet Take 10 mg by mouth 3 (three) times daily as needed for muscle spasms.     Marland Kitchen GINKGO BILOBA COMPLEX PO Take 1 tablet by mouth daily.    . Ginseng 500 MG CAPS Take 1 capsule by mouth daily.    . metFORMIN (GLUCOPHAGE) 500 MG tablet TAKE 2 TABLETS BY MOUTH DAILY WITH BREAKFAST 180 tablet 3  . Multiple Vitamins-Minerals (CENTRUM SILVER PO) Take 1 tablet by mouth daily.      Marland Kitchen oxycodone (ROXICODONE) 30 MG immediate release tablet 30 mg 5 times daily as needed for pain. 20 tablet 0  . rosuvastatin (CRESTOR) 40 MG tablet Take 1 tablet (40 mg total) by mouth daily. 90 tablet 3  . sertraline (ZOLOFT) 100 MG tablet Take 2 tablets (200 mg total) by mouth daily. Yearly physical w/labs due in August 180 tablet 3  . solifenacin (VESICARE) 5 MG tablet Take 1 tablet (5 mg total) by mouth daily. 90 tablet 3  . tiotropium (SPIRIVA) 18 MCG inhalation capsule Place 1 capsule (18 mcg total) into inhaler and inhale daily. 30 capsule 5   No current facility-administered medications on  file prior to visit.    Review of Systems Constitutional: Negative for other unusual diaphoresis, sweats, appetite or weight changes HENT: Negative for other worsening hearing loss, ear pain, facial swelling, mouth sores or neck stiffness.   Eyes: Negative for other worsening pain, redness or other visual disturbance.  Respiratory: Negative for other stridor or swelling Cardiovascular: Negative for other palpitations or other chest pain  Gastrointestinal: Negative for worsening diarrhea or loose stools, blood in stool, distention or other pain Genitourinary: Negative for hematuria, flank pain or other change in urine volume.  Musculoskeletal: Negative for myalgias or other joint swelling.  Skin: Negative for other color change, or other wound  or worsening drainage.  Neurological: Negative for other syncope or numbness. Hematological: Negative for other adenopathy or swelling Psychiatric/Behavioral: Negative for hallucinations, other worsening agitation, SI, self-injury, or new decreased concentration All other system neg per pt    Objective:   Physical Exam BP 136/86   Pulse 74   Temp 97.8 F (36.6 C) (Oral)   Ht 5\' 7"  (1.702 m)   Wt 145 lb (65.8 kg)   SpO2 98%   BMI 22.71 kg/m  VS noted,  Constitutional: Pt is oriented to person, place, and time. Appears well-developed and well-nourished, in no significant distress and comfortable Head: Normocephalic and atraumatic  Eyes: Conjunctivae and EOM are normal. Pupils are equal, round, and reactive to light Right Ear: External ear normal without discharge Left Ear: External ear normal without discharge Nose: Nose without discharge or deformity Mouth/Throat: Oropharynx is without other ulcerations and moist  Neck: Normal range of motion. Neck supple. No JVD present. No tracheal deviation present or significant neck LA or mass Cardiovascular: Normal rate, regular rhythm, normal heart sounds and intact distal pulses.   Pulmonary/Chest:  WOB normal and breath sounds without rales or wheezing  Abdominal: Soft. Bowel sounds are normal. NT. No HSM  Musculoskeletal: Normal range of motion. Exhibits no edema Lymphadenopathy: Has no other cervical adenopathy.  Neurological: Pt is alert and oriented to person, place, and time. Pt has normal reflexes. No cranial nerve deficit. Motor grossly intact, Gait intact Skin: Skin is warm and dry. No rash noted or new ulcerations Psychiatric:  Has nervous mood and affect. Behavior is normal without agitation No other exam findings Lab Results  Component Value Date   WBC 8.1 12/21/2016   HGB 13.0 12/21/2016   HCT 39.8 12/21/2016   PLT 197.0 12/21/2016   GLUCOSE 278 (H) 12/21/2016   CHOL 106 12/21/2016   TRIG 76.0 12/21/2016   HDL 54.10 12/21/2016   LDLDIRECT 103.0 11/17/2015   LDLCALC 37 12/21/2016   ALT 28 12/21/2016   AST 26 12/21/2016   NA 139 12/21/2016   K 4.4 12/21/2016   CL 103 12/21/2016   CREATININE 0.77 12/21/2016   BUN 24 (H) 12/21/2016   CO2 31 12/21/2016   TSH 1.40 12/21/2016   HGBA1C 8.2 (H) 12/21/2016   MICROALBUR 1.5 12/21/2016        Assessment & Plan:

## 2016-12-25 NOTE — Assessment & Plan Note (Signed)
Ab positive, but RNA negative, no further tx needed

## 2016-12-25 NOTE — Assessment & Plan Note (Addendum)
Improved symtpoms, afeb, exam benign, s/p doxy for 10 days, no need for further antibx at this time  In addition to the time spent performing CPE, I spent an additional 25 minutes face to face,in which greater than 50% of this time was spent in counseling and coordination of care for patient's acute illness as documented, including the differential dx, tx, further evaluation and other management of uncontrolled DM, RMSF, HTN, HLD and Hep C

## 2016-12-25 NOTE — Patient Instructions (Addendum)
Please take all new medication as prescribed - the sklice as directed  OK to increase the metformin to 2 of the 500 mg tabs taking TWICE per day  Please take all new medication as prescribed - the trulicity once per week  Please schedule the bone density test before leaving today at the scheduling desk (where you check out)  Please remember to keep up your November 2018 Eye doctor and Dermaology appts  Please continue all other medications as before, and refills have been done if requested.  Please have the pharmacy call with any other refills you may need.  Please continue your efforts at being more active, low cholesterol diet, and weight control.  You are otherwise up to date with prevention measures today.  Please keep your appointments with your specialists as you may have planned  Please return in 6 months, or sooner if needed, with Lab testing done 3-5 days before

## 2016-12-25 NOTE — Assessment & Plan Note (Signed)
Mild uncontrolled, likely due to diet and less activity, for better lifestyle, also for increased metforomin 1000 bid, and start trulicity lower dose, f/u next visit

## 2016-12-25 NOTE — Assessment & Plan Note (Signed)
stable overall by history and exam, recent data reviewed with pt, and pt to continue medical treatment as before,  to f/u any worsening symptoms or concerns BP Readings from Last 3 Encounters:  12/25/16 136/86  12/12/16 102/72  11/16/16 100/68

## 2016-12-25 NOTE — Assessment & Plan Note (Signed)
stable overall by history and exam, recent data reviewed with pt, and pt to continue medical treatment as before,  to f/u any worsening symptoms or concerns Lab Results  Component Value Date   LDLCALC 37 12/21/2016

## 2016-12-25 NOTE — Telephone Encounter (Signed)
Called pt, LVM.   

## 2016-12-27 ENCOUNTER — Other Ambulatory Visit: Payer: Self-pay

## 2017-01-09 DIAGNOSIS — M546 Pain in thoracic spine: Secondary | ICD-10-CM | POA: Diagnosis not present

## 2017-01-09 DIAGNOSIS — G894 Chronic pain syndrome: Secondary | ICD-10-CM | POA: Diagnosis not present

## 2017-01-09 DIAGNOSIS — M5416 Radiculopathy, lumbar region: Secondary | ICD-10-CM | POA: Diagnosis not present

## 2017-01-09 DIAGNOSIS — Z79891 Long term (current) use of opiate analgesic: Secondary | ICD-10-CM | POA: Diagnosis not present

## 2017-01-10 DIAGNOSIS — M791 Myalgia, unspecified site: Secondary | ICD-10-CM | POA: Diagnosis not present

## 2017-01-10 DIAGNOSIS — M533 Sacrococcygeal disorders, not elsewhere classified: Secondary | ICD-10-CM | POA: Diagnosis not present

## 2017-01-10 DIAGNOSIS — M792 Neuralgia and neuritis, unspecified: Secondary | ICD-10-CM | POA: Diagnosis not present

## 2017-01-11 ENCOUNTER — Ambulatory Visit: Payer: Self-pay | Admitting: Emergency Medicine

## 2017-02-07 DIAGNOSIS — Z5181 Encounter for therapeutic drug level monitoring: Secondary | ICD-10-CM | POA: Diagnosis not present

## 2017-02-07 DIAGNOSIS — M533 Sacrococcygeal disorders, not elsewhere classified: Secondary | ICD-10-CM | POA: Diagnosis not present

## 2017-02-07 DIAGNOSIS — M792 Neuralgia and neuritis, unspecified: Secondary | ICD-10-CM | POA: Diagnosis not present

## 2017-02-07 DIAGNOSIS — M791 Myalgia, unspecified site: Secondary | ICD-10-CM | POA: Diagnosis not present

## 2017-02-23 DIAGNOSIS — M533 Sacrococcygeal disorders, not elsewhere classified: Secondary | ICD-10-CM | POA: Diagnosis not present

## 2017-02-23 DIAGNOSIS — M791 Myalgia, unspecified site: Secondary | ICD-10-CM | POA: Diagnosis not present

## 2017-02-23 DIAGNOSIS — M792 Neuralgia and neuritis, unspecified: Secondary | ICD-10-CM | POA: Diagnosis not present

## 2017-04-03 DIAGNOSIS — M791 Myalgia, unspecified site: Secondary | ICD-10-CM | POA: Diagnosis not present

## 2017-04-03 DIAGNOSIS — M792 Neuralgia and neuritis, unspecified: Secondary | ICD-10-CM | POA: Diagnosis not present

## 2017-04-03 DIAGNOSIS — M533 Sacrococcygeal disorders, not elsewhere classified: Secondary | ICD-10-CM | POA: Diagnosis not present

## 2017-04-03 DIAGNOSIS — Z5181 Encounter for therapeutic drug level monitoring: Secondary | ICD-10-CM | POA: Diagnosis not present

## 2017-04-15 ENCOUNTER — Emergency Department (HOSPITAL_COMMUNITY): Payer: Medicare HMO

## 2017-04-15 ENCOUNTER — Encounter (HOSPITAL_COMMUNITY): Payer: Self-pay | Admitting: Emergency Medicine

## 2017-04-15 ENCOUNTER — Emergency Department (HOSPITAL_COMMUNITY)
Admission: EM | Admit: 2017-04-15 | Discharge: 2017-04-15 | Disposition: A | Discharge status: Home / Self Care | Payer: Medicare HMO | Attending: Emergency Medicine | Admitting: Emergency Medicine

## 2017-04-15 DIAGNOSIS — S2241XA Multiple fractures of ribs, right side, initial encounter for closed fracture: Secondary | ICD-10-CM

## 2017-04-15 DIAGNOSIS — S301XXA Contusion of abdominal wall, initial encounter: Secondary | ICD-10-CM | POA: Diagnosis not present

## 2017-04-15 DIAGNOSIS — Z7982 Long term (current) use of aspirin: Secondary | ICD-10-CM | POA: Diagnosis not present

## 2017-04-15 DIAGNOSIS — R0781 Pleurodynia: Secondary | ICD-10-CM | POA: Diagnosis not present

## 2017-04-15 DIAGNOSIS — W010XXA Fall on same level from slipping, tripping and stumbling without subsequent striking against object, initial encounter: Secondary | ICD-10-CM | POA: Diagnosis not present

## 2017-04-15 DIAGNOSIS — J449 Chronic obstructive pulmonary disease, unspecified: Secondary | ICD-10-CM | POA: Insufficient documentation

## 2017-04-15 DIAGNOSIS — F17210 Nicotine dependence, cigarettes, uncomplicated: Secondary | ICD-10-CM | POA: Insufficient documentation

## 2017-04-15 DIAGNOSIS — Y929 Unspecified place or not applicable: Secondary | ICD-10-CM | POA: Diagnosis not present

## 2017-04-15 DIAGNOSIS — Z7984 Long term (current) use of oral hypoglycemic drugs: Secondary | ICD-10-CM | POA: Insufficient documentation

## 2017-04-15 DIAGNOSIS — R21 Rash and other nonspecific skin eruption: Secondary | ICD-10-CM | POA: Insufficient documentation

## 2017-04-15 DIAGNOSIS — Y939 Activity, unspecified: Secondary | ICD-10-CM | POA: Insufficient documentation

## 2017-04-15 DIAGNOSIS — E119 Type 2 diabetes mellitus without complications: Secondary | ICD-10-CM | POA: Insufficient documentation

## 2017-04-15 DIAGNOSIS — S299XXA Unspecified injury of thorax, initial encounter: Secondary | ICD-10-CM | POA: Diagnosis not present

## 2017-04-15 DIAGNOSIS — I10 Essential (primary) hypertension: Secondary | ICD-10-CM | POA: Diagnosis not present

## 2017-04-15 DIAGNOSIS — Y999 Unspecified external cause status: Secondary | ICD-10-CM | POA: Diagnosis not present

## 2017-04-15 LAB — BASIC METABOLIC PANEL
Anion gap: 12 (ref 5–15)
BUN: 19 mg/dL (ref 6–20)
CO2: 26 mmol/L (ref 22–32)
Calcium: 9.3 mg/dL (ref 8.9–10.3)
Chloride: 98 mmol/L — ABNORMAL LOW (ref 101–111)
Creatinine, Ser: 1.1 mg/dL — ABNORMAL HIGH (ref 0.44–1.00)
GFR calc Af Amer: 58 mL/min — ABNORMAL LOW (ref >60)
GFR calc non Af Amer: 50 mL/min — ABNORMAL LOW (ref >60)
Glucose, Bld: 174 mg/dL — ABNORMAL HIGH (ref 65–99)
Potassium: 5 mmol/L (ref 3.5–5.1)
Sodium: 136 mmol/L (ref 135–145)

## 2017-04-15 LAB — CBC
HCT: 42.4 % (ref 36.0–46.0)
Hemoglobin: 14.5 g/dL (ref 12.0–15.0)
MCH: 31.5 pg (ref 26.0–34.0)
MCHC: 34.2 g/dL (ref 30.0–36.0)
MCV: 92.2 fL (ref 78.0–100.0)
Platelets: 289 10*3/uL (ref 150–400)
RBC: 4.6 MIL/uL (ref 3.87–5.11)
RDW: 13.8 % (ref 11.5–15.5)
WBC: 10.2 10*3/uL (ref 4.0–10.5)

## 2017-04-15 MED ORDER — HYDROCODONE-ACETAMINOPHEN 5-325 MG PO TABS
2.0000 | ORAL_TABLET | Freq: Once | ORAL | Status: AC
  Administered 2017-04-15: 2 via ORAL
  Filled 2017-04-15: qty 2

## 2017-04-15 MED ORDER — HYDROCODONE-ACETAMINOPHEN 5-325 MG PO TABS: 1.0000 | ORAL_TABLET | ORAL | 0 refills | Status: DC | PRN

## 2017-04-15 NOTE — ED Provider Notes (Signed)
[facility] EMERGENCY DEPARTMENT Provider Note   CSN: [id number] Arrival date & time: 04/15/17  1541     History   Chief Complaint Chief Complaint  Patient presents with  . Fall  . Rib Injury  . abdominal bruising    HPI PANSY OSTROVSKY is a 72 y.o. female.  Patient with history of COPD, hepatitis C that was treated, depression, hyperlipidemia presents with right lower flank and rib pain since fall on Wednesday. Patient tripped. No head injury or loss consciousness. Patient said persistently mild worsening pain with palpation and movement since then. No vomiting or blood in the stools. Patient is on blood thinners.      Past Medical History:  Diagnosis Date  . ANGIOEDEMA 01/24/2010  . Anxiety   . ANXIETY 11/07/2006  . BACK PAIN 11/01/2007  . Cough 05/29/2008  . CYST, OVARIAN NEC/NOS 11/05/2006  . Depression   . DEPRESSION 11/07/2006  . Diabetes mellitus   . DIABETES MELLITUS, TYPE II 11/07/2006  . DIVERTICULOSIS, COLON 11/07/2006  . FIBROCYSTIC BREAST DISEASE 11/05/2006  . HEAD TRAUMA, CLOSED 01/24/2010  . HEPATITIS B, HX OF 11/05/2006  . Hepatitis C    Treated and no longer has  . HEPATITIS C 11/05/2006  . Hyperlipidemia   . HYPERLIPIDEMIA 11/07/2006  . Hypertension   . HYPERTENSION 05/29/2007  . INSOMNIA, HX OF 11/05/2006  . LOW BACK PAIN 05/29/2007  . Osteopenia   . OSTEOPENIA 11/07/2006  . Other specified sites of sprains and strains 08/31/2008  . Pain    R. shoulder and lower back and legs  . PULMONARY NODULE, LEFT UPPER LOBE 05/31/2009  . Seizures ([place])   . SHOULDER PAIN, RIGHT, CHRONIC 06/02/2010  . SINUSITIS- ACUTE-NOS 12/10/2007  . SKIN LESION 06/02/2010  . TOTAL ABDOMINAL HYSTERECTOMY, HX OF 03/27/1990  . Wears dentures    full top  . Wears glasses     Patient Active Problem List   Diagnosis Date Noted  . RMSF [facility] spotted fever) 12/25/2016  . COPD (chronic obstructive pulmonary disease) ([place]) 11/16/2016  . Rash 11/16/2016  .  Choledocholithiasis 06/30/2015  . Urinary urgency 06/24/2015  . Right flank pain 06/24/2015  . RUQ pain 06/24/2015  . Delusions ([place]) 11/03/2014  . Hallucinations 11/03/2014  . Chronic pain syndrome   . Seizure ([place]) 11/01/2014  . Smoker 08/12/2014  . Encounter for well adult exam with abnormal findings 11/02/2010  . SKIN LESION 06/02/2010  . SHOULDER PAIN, RIGHT, CHRONIC 06/02/2010  . HEAD TRAUMA, CLOSED 01/24/2010  . ANGIOEDEMA 01/24/2010  . Pulmonary nodule, left 05/31/2009  . Other specified sites of sprains and strains 08/31/2008  . Cough 05/29/2008  . BACK PAIN 11/01/2007  . Essential hypertension 05/29/2007  . LOW BACK PAIN 05/29/2007  . Diabetes ([place]) 11/07/2006  . Hyperlipidemia 11/07/2006  . Anxiety state 11/07/2006  . Depression 11/07/2006  . DIVERTICULOSIS, COLON 11/07/2006  . OSTEOPENIA 11/07/2006  . HEPATITIS C 11/05/2006  . FIBROCYSTIC BREAST DISEASE 11/05/2006  . CYST, OVARIAN NEC/NOS 11/05/2006  . HEPATITIS B, HX OF 11/05/2006  . INSOMNIA, HX OF 11/05/2006  . TOTAL ABDOMINAL HYSTERECTOMY, HX OF 03/27/1990    Past Surgical History:  Procedure Laterality Date  . APPENDECTOMY    . CERVICAL FUSION  2013  . CESAREAN SECTION    . COLONOSCOPY    . EUS N/A 07/29/2015   Procedure: UPPER ENDOSCOPIC ULTRASOUND (EUS) LINEAR;  Surgeon: Milus Banister, MD;  Location: [facility];  Service: Endoscopy;  Laterality: N/A;  . EXTERNAL  FIXATION LEG Left 12/12/2012   Procedure: LEFT ADJUSTMENT OF EXTERNAL FIXATION UNDER ANESTHESIA;  Surgeon: Wylene Simmer, MD;  Location: [facility];  Service: Orthopedics;  Laterality: Left;  . LIVER BIOPSY    . ORIF ANKLE FRACTURE Left 12/03/2012  . ORIF ANKLE FRACTURE Left 01/02/2013   Procedure: LEFT ANKLE REMOVAL OF EXTERNAL FIXATOR OPEN REDUCTION INTERNAL FIXATION (ORIF) TRIMALLEOLAR FRACTURE;  Surgeon: Wylene Simmer, MD;  Location: [facility];  Service: Orthopedics;  Laterality: Left;  . OVARIAN CYST SURGERY      ruptured  . SHOULDER ARTHROSCOPY  2008   left  . TUBAL LIGATION      OB History    No data available       Home Medications    Prior to Admission medications   Medication Sig Start Date End Date Taking? Authorizing Provider  albuterol (PROVENTIL HFA;VENTOLIN HFA) 108 (90 Base) MCG/ACT inhaler Inhale 2 puffs into the lungs every 6 (six) hours as needed for wheezing or shortness of breath. 11/16/16   Biagio Borg, MD  aspirin EC 325 MG tablet Take 325 mg by mouth daily.    [provider]  atenolol (TENORMIN) 50 MG tablet Take 1 tablet (50 mg total) by mouth daily. 11/19/16   Biagio Borg, MD  B Complex-C (B-COMPLEX WITH VITAMIN C) tablet Take 1 tablet by mouth daily.    [provider]  clonazePAM (KLONOPIN) 1 MG tablet TAKE 1 TABLET BY MOUTH TWICE DAILY AS NEEDED FOR ANXIETY 12/19/16   Biagio Borg, MD  cyclobenzaprine (FLEXERIL) 10 MG tablet Take 10 mg by mouth 3 (three) times daily as needed for muscle spasms.  10/29/14   [provider]  Dulaglutide (TRULICITY) 8.29 FA/2.1HY SOPN Use as directed weekly SQ 12/25/16   Biagio Borg, MD  [facility] BILOBA COMPLEX PO Take 1 tablet by mouth daily.    [provider]  Ginseng 500 MG CAPS Take 1 capsule by mouth daily.    [provider]  HYDROcodone-acetaminophen (NORCO) 5-325 MG tablet Take 1-2 tablets by mouth every 4 (four) hours as needed. 04/15/17   Elnora Morrison, MD  Ivermectin (SKLICE) 0.5 % LOTN Use as directed 12/25/16   Biagio Borg, MD  metFORMIN (GLUCOPHAGE) 500 MG tablet TAKE 2 TABLETS BY MOUTH Twice daily 12/25/16   Biagio Borg, MD  Multiple Vitamins-Minerals (CENTRUM SILVER PO) Take 1 tablet by mouth daily.      [provider]  oxycodone (ROXICODONE) 30 MG immediate release tablet 30 mg 5 times daily as needed for pain. 11/04/14   Eugenie Filler, MD  rosuvastatin (CRESTOR) 40 MG tablet Take 1 tablet (40 mg total) by mouth daily. 11/19/16   Biagio Borg, MD  sertraline  (ZOLOFT) 100 MG tablet Take 2 tablets (200 mg total) by mouth daily. Yearly physical w/labs due in August 11/19/16   Biagio Borg, MD  solifenacin (VESICARE) 5 MG tablet Take 1 tablet (5 mg total) by mouth daily. 11/19/16   Biagio Borg, MD  tiotropium (SPIRIVA) 18 MCG inhalation capsule Place 1 capsule (18 mcg total) into inhaler and inhale daily. 12/12/16   Collene Gobble, MD    Family History Family History  Problem Relation Age of Onset  . Liver cancer Mother     Social History Social History   Tobacco Use  . Smoking status: Current Every Day Smoker    Packs/day: 1.00    Years: 50.00    Pack years: 50.00  Types: Cigarettes  . Smokeless tobacco: Never Used  Substance Use Topics  . Alcohol use: No  . Drug use: No     Allergies   Naproxen sodium; Sulfonamide derivatives; and Ace inhibitors   Review of Systems Review of Systems  Constitutional: Negative for chills and fever.  HENT: Negative for congestion.   Eyes: Negative for visual disturbance.  Respiratory: Negative for shortness of breath.   Cardiovascular: Negative for chest pain.  Gastrointestinal: Negative for abdominal pain and vomiting.  Genitourinary: Positive for flank pain. Negative for dysuria.  Musculoskeletal: Negative for back pain, neck pain and neck stiffness.  Skin: Positive for rash and wound.  Neurological: Negative for light-headedness and headaches.     Physical Exam Updated Vital Signs BP 128/86   Pulse 78   Temp 98.6 F (37 C) (Oral)   Resp 18   SpO2 96%   Physical Exam  Constitutional: She is oriented to person, place, and time. She appears well-developed and well-nourished.  HENT:  Head: Normocephalic and atraumatic.  Eyes: Conjunctivae are normal. Right eye exhibits no discharge. Left eye exhibits no discharge.  Neck: Normal range of motion. Neck supple. No tracheal deviation present.  Cardiovascular: Normal rate and regular rhythm.  Pulmonary/Chest: Effort normal and breath  sounds normal.  Abdominal: Soft. She exhibits no distension. There is no tenderness. There is no guarding.  Musculoskeletal: She exhibits tenderness. She exhibits no edema.  Patient has tenderness and ecchymosis right lower flank/anterior ribs.  Neurological: She is alert and oriented to person, place, and time.  Skin: Skin is warm.  Psychiatric: She has a normal mood and affect.  Nursing note and vitals reviewed.    ED Treatments / Results  Labs (all labs ordered are listed, but only abnormal results are displayed) Labs Reviewed  BASIC METABOLIC PANEL - Abnormal; Notable for the following components:      Result Value   Chloride 98 (*)    Glucose, Bld 174 (*)    Creatinine, Ser 1.10 (*)    GFR calc non Af Amer 50 (*)    GFR calc Af Amer 58 (*)    All other components within normal limits  CBC    EKG  EKG Interpretation None       Radiology Dg Ribs Unilateral W/chest Right  Result Date: 04/15/2017 CLINICAL DATA:  72 year old female with right-sided rib pain after falling 4 days ago EXAM: RIGHT RIBS AND CHEST - 3+ VIEW COMPARISON:  Prior chest x-ray 11/16/2016 FINDINGS: Probable nondisplaced fractures through the posterolateral aspect of right ribs 10 and 11. There is no evidence of pneumothorax or pleural effusion. Both lungs are clear. Heart size and mediastinal contours are within normal limits. Atherosclerotic calcifications again noted in the transverse aorta. Stable mild bronchitic changes. Incompletely imaged anterior cervical fusion hardware and interbody grafts. IMPRESSION: Probable nondisplaced fractures through the posterolateral aspects of right ribs 10 and 11 seen on a single view. No acute cardiopulmonary process. Electronically Signed   By: Jacqulynn Cadet M.D.   On: 04/15/2017 16:48    Procedures Procedures (including critical care time)  Medications Ordered in ED Medications  HYDROcodone-acetaminophen (NORCO/VICODIN) 5-325 MG per tablet 2 tablet (not  administered)     Initial Impression / Assessment and Plan / ED Course  I have reviewed the triage vital signs and the nursing notes.  Pertinent labs & imaging results that were available during my care of the patient were reviewed by me and considered in my medical decision making (see chart  for details).  Clinical Course as of Apr 15 1813  Sun Apr 15, 2017  1721 DG Ribs Unilateral W/Chest Right [MJ]  1721 DG Ribs Unilateral W/Chest Right [MJ]    Clinical Course User Index [MJ] Tanna Furry, MD    Patient with clinical concern for rib fractures. Discussed importance of spirometer, albuterol as needed, pain meds as needed and close follow-up with primary doctor.  Results and differential diagnosis were discussed with the patient/parent/guardian. Xrays were independently reviewed by myself.  Close follow up outpatient was discussed, comfortable with the plan.   Medications  HYDROcodone-acetaminophen (NORCO/VICODIN) 5-325 MG per tablet 2 tablet (not administered)    Vitals:   04/15/17 1558  BP: 128/86  Pulse: 78  Resp: 18  Temp: 98.6 F (37 C)  TempSrc: Oral  SpO2: 96%    Final diagnoses:  Fracture of two ribs, right, closed, initial encounter     Final Clinical Impressions(s) / ED Diagnoses   Final diagnoses:  Fracture of two ribs, right, closed, initial encounter    ED Discharge Orders        Ordered    HYDROcodone-acetaminophen (NORCO) 5-325 MG tablet  Every 4 hours PRN     04/15/17 1811       Elnora Morrison, MD 04/15/17 1816

## 2017-04-15 NOTE — Discharge Instructions (Signed)
Continue to use spirometer and albuterol as needed. See clinician if you develop productive cough fevers or new concerns. Use ice and Tylenol as needed for pain. Use Norco for severe pain however realizing make you sleepy and do not operate machines or drive while taking.  If you were given medicines take as directed.  If you are on coumadin or contraceptives realize their levels and effectiveness is altered by many different medicines.  If you have any reaction (rash, tongues swelling, other) to the medicines stop taking and see a physician.    If your blood pressure was elevated in the ER make sure you follow up for management with a primary doctor or return for chest pain, shortness of breath or stroke symptoms.  Please follow up as directed and return to the ER or see a physician for new or worsening symptoms.  Thank you. Vitals:   04/15/17 1558  BP: 128/86  Pulse: 78  Resp: 18  Temp: 98.6 F (37 C)  TempSrc: Oral  SpO2: 96%

## 2017-04-15 NOTE — ED Triage Notes (Signed)
Pt states she had a mechanical fall on Wednesday. Has bruising to right abdomen but pain is to right rib area. Pt not on blood thinners.

## 2017-04-24 ENCOUNTER — Other Ambulatory Visit: Payer: Self-pay

## 2017-04-24 ENCOUNTER — Emergency Department (HOSPITAL_COMMUNITY): Payer: Medicare HMO

## 2017-04-24 ENCOUNTER — Observation Stay (HOSPITAL_COMMUNITY)
Admission: EM | Admit: 2017-04-24 | Discharge: 2017-04-26 | Disposition: A | Discharge status: Home / Self Care | Payer: Medicare HMO | Attending: Family Medicine | Admitting: Family Medicine

## 2017-04-24 DIAGNOSIS — Z79899 Other long term (current) drug therapy: Secondary | ICD-10-CM | POA: Insufficient documentation

## 2017-04-24 DIAGNOSIS — E119 Type 2 diabetes mellitus without complications: Secondary | ICD-10-CM | POA: Insufficient documentation

## 2017-04-24 DIAGNOSIS — Z7982 Long term (current) use of aspirin: Secondary | ICD-10-CM | POA: Diagnosis not present

## 2017-04-24 DIAGNOSIS — I10 Essential (primary) hypertension: Secondary | ICD-10-CM | POA: Insufficient documentation

## 2017-04-24 DIAGNOSIS — Z8619 Personal history of other infectious and parasitic diseases: Secondary | ICD-10-CM | POA: Diagnosis not present

## 2017-04-24 DIAGNOSIS — J449 Chronic obstructive pulmonary disease, unspecified: Secondary | ICD-10-CM | POA: Diagnosis not present

## 2017-04-24 DIAGNOSIS — I4510 Unspecified right bundle-branch block: Secondary | ICD-10-CM | POA: Diagnosis not present

## 2017-04-24 DIAGNOSIS — G8929 Other chronic pain: Secondary | ICD-10-CM | POA: Insufficient documentation

## 2017-04-24 DIAGNOSIS — Z7984 Long term (current) use of oral hypoglycemic drugs: Secondary | ICD-10-CM | POA: Diagnosis not present

## 2017-04-24 DIAGNOSIS — R55 Syncope and collapse: Secondary | ICD-10-CM | POA: Diagnosis not present

## 2017-04-24 DIAGNOSIS — B1710 Acute hepatitis C without hepatic coma: Secondary | ICD-10-CM | POA: Diagnosis present

## 2017-04-24 DIAGNOSIS — R404 Transient alteration of awareness: Secondary | ICD-10-CM | POA: Diagnosis not present

## 2017-04-24 DIAGNOSIS — G894 Chronic pain syndrome: Secondary | ICD-10-CM | POA: Diagnosis present

## 2017-04-24 DIAGNOSIS — E1165 Type 2 diabetes mellitus with hyperglycemia: Secondary | ICD-10-CM

## 2017-04-24 DIAGNOSIS — IMO0002 Reserved for concepts with insufficient information to code with codable children: Secondary | ICD-10-CM

## 2017-04-24 DIAGNOSIS — F17210 Nicotine dependence, cigarettes, uncomplicated: Secondary | ICD-10-CM | POA: Insufficient documentation

## 2017-04-24 LAB — BASIC METABOLIC PANEL
Anion gap: 5 (ref 5–15)
BUN: 23 mg/dL — AB (ref 6–20)
CALCIUM: 8.7 mg/dL — AB (ref 8.9–10.3)
CHLORIDE: 108 mmol/L (ref 101–111)
CO2: 26 mmol/L (ref 22–32)
Creatinine, Ser: 0.76 mg/dL (ref 0.44–1.00)
GFR calc Af Amer: >60 mL/min (ref >60)
GFR calc non Af Amer: >60 mL/min (ref >60)
Glucose, Bld: 132 mg/dL — ABNORMAL HIGH (ref 65–99)
Potassium: 4.4 mmol/L (ref 3.5–5.1)
SODIUM: 139 mmol/L (ref 135–145)

## 2017-04-24 LAB — CBC
HCT: 40.3 % (ref 36.0–46.0)
Hemoglobin: 13.4 g/dL (ref 12.0–15.0)
MCH: 31.1 pg (ref 26.0–34.0)
MCHC: 33.3 g/dL (ref 30.0–36.0)
MCV: 93.5 fL (ref 78.0–100.0)
Platelets: 254 10*3/uL (ref 150–400)
RBC: 4.31 MIL/uL (ref 3.87–5.11)
RDW: 14.2 % (ref 11.5–15.5)
WBC: 11.4 10*3/uL — ABNORMAL HIGH (ref 4.0–10.5)

## 2017-04-24 LAB — I-STAT TROPONIN, ED: TROPONIN I, POC: 0.01 ng/mL (ref 0.00–0.08)

## 2017-04-24 NOTE — ED Triage Notes (Addendum)
Pt had wittnessed  syncopal episode when leave local store that last 5 to 10 sec VS BP 130/68 after 500 ml bolus NSS, HR 63, resp14, 88% RA, CBG 113. C/o generalized weakness most of day denies fever SOB or N/V/D. Stressful day with son who was a pt a [employer] that was kicked out of Medical sales representative for conflict with Publishing copy.

## 2017-04-24 NOTE — ED Provider Notes (Signed)
[facility] DEPT Provider Note   CSN: [id number] Arrival date & time: 04/24/17  1920     History   Chief Complaint No chief complaint on file.   HPI Crystal Thornton is a 72 y.o. female.  72 year old female with prior history of COPD, hep C, hypertension, and depression presents following a syncopal event.  Patient was reportedly at the [facility] today when she "passed out."  Patient denies specific inciting event.  Patient denies specific preceding symptoms.  She denies associated chest pain or shortness of breath.  She reports that she feels better now.  Patient reports that she was feeling a "little bit weak" earlier today but that has resolved.   The patient's reported syncope was fairly brief lasting less than 1 minute.  Patient denies specific injury after falling.  Patient denies recent similar events in the past.   The history is provided by the patient and medical records.  Loss of Consciousness   This is a new problem. The current episode started 1 to 2 hours ago. The problem occurs rarely. The problem has not changed since onset.She lost consciousness for a period of less than one minute. The problem is associated with normal activity. Pertinent negatives include chest pain and fever. She has tried nothing for the symptoms. The treatment provided no relief.    Past Medical History:  Diagnosis Date  . ANGIOEDEMA 01/24/2010  . Anxiety   . ANXIETY 11/07/2006  . BACK PAIN 11/01/2007  . Cough 05/29/2008  . CYST, OVARIAN NEC/NOS 11/05/2006  . Depression   . DEPRESSION 11/07/2006  . Diabetes mellitus   . DIABETES MELLITUS, TYPE II 11/07/2006  . DIVERTICULOSIS, COLON 11/07/2006  . FIBROCYSTIC BREAST DISEASE 11/05/2006  . HEAD TRAUMA, CLOSED 01/24/2010  . HEPATITIS B, HX OF 11/05/2006  . Hepatitis C    Treated and no longer has  . HEPATITIS C 11/05/2006  . Hyperlipidemia   . HYPERLIPIDEMIA 11/07/2006  . Hypertension   . HYPERTENSION 05/29/2007  .  INSOMNIA, HX OF 11/05/2006  . LOW BACK PAIN 05/29/2007  . Osteopenia   . OSTEOPENIA 11/07/2006  . Other specified sites of sprains and strains 08/31/2008  . Pain    R. shoulder and lower back and legs  . PULMONARY NODULE, LEFT UPPER LOBE 05/31/2009  . Seizures ([place])   . SHOULDER PAIN, RIGHT, CHRONIC 06/02/2010  . SINUSITIS- ACUTE-NOS 12/10/2007  . SKIN LESION 06/02/2010  . TOTAL ABDOMINAL HYSTERECTOMY, HX OF 03/27/1990  . Wears dentures    full top  . Wears glasses     Patient Active Problem List   Diagnosis Date Noted  . RMSF [facility] spotted fever) 12/25/2016  . COPD (chronic obstructive pulmonary disease) ([place]) 11/16/2016  . Rash 11/16/2016  . Choledocholithiasis 06/30/2015  . Urinary urgency 06/24/2015  . Right flank pain 06/24/2015  . RUQ pain 06/24/2015  . Delusions ([place]) 11/03/2014  . Hallucinations 11/03/2014  . Chronic pain syndrome   . Seizure ([place]) 11/01/2014  . Smoker 08/12/2014  . Encounter for well adult exam with abnormal findings 11/02/2010  . SKIN LESION 06/02/2010  . SHOULDER PAIN, RIGHT, CHRONIC 06/02/2010  . HEAD TRAUMA, CLOSED 01/24/2010  . ANGIOEDEMA 01/24/2010  . Pulmonary nodule, left 05/31/2009  . Other specified sites of sprains and strains 08/31/2008  . Cough 05/29/2008  . BACK PAIN 11/01/2007  . Essential hypertension 05/29/2007  . LOW BACK PAIN 05/29/2007  . Diabetes ([place]) 11/07/2006  . Hyperlipidemia 11/07/2006  . Anxiety state 11/07/2006  .  Depression 11/07/2006  . DIVERTICULOSIS, COLON 11/07/2006  . OSTEOPENIA 11/07/2006  . HEPATITIS C 11/05/2006  . FIBROCYSTIC BREAST DISEASE 11/05/2006  . CYST, OVARIAN NEC/NOS 11/05/2006  . HEPATITIS B, HX OF 11/05/2006  . INSOMNIA, HX OF 11/05/2006  . TOTAL ABDOMINAL HYSTERECTOMY, HX OF 03/27/1990    Past Surgical History:  Procedure Laterality Date  . APPENDECTOMY    . CERVICAL FUSION  2013  . CESAREAN SECTION    . COLONOSCOPY    . EUS N/A 07/29/2015   Procedure: UPPER ENDOSCOPIC ULTRASOUND  (EUS) LINEAR;  Surgeon: Milus Banister, MD;  Location: [facility];  Service: Endoscopy;  Laterality: N/A;  . EXTERNAL FIXATION LEG Left 12/12/2012   Procedure: LEFT ADJUSTMENT OF EXTERNAL FIXATION UNDER ANESTHESIA;  Surgeon: Wylene Simmer, MD;  Location: [facility];  Service: Orthopedics;  Laterality: Left;  . LIVER BIOPSY    . ORIF ANKLE FRACTURE Left 12/03/2012  . ORIF ANKLE FRACTURE Left 01/02/2013   Procedure: LEFT ANKLE REMOVAL OF EXTERNAL FIXATOR OPEN REDUCTION INTERNAL FIXATION (ORIF) TRIMALLEOLAR FRACTURE;  Surgeon: Wylene Simmer, MD;  Location: [facility];  Service: Orthopedics;  Laterality: Left;  . OVARIAN CYST SURGERY     ruptured  . SHOULDER ARTHROSCOPY  2008   left  . TUBAL LIGATION      OB History    No data available       Home Medications    Prior to Admission medications   Medication Sig Start Date End Date Taking? Authorizing Provider  aspirin EC 325 MG tablet Take 325 mg by mouth daily.   Yes [provider]  atenolol (TENORMIN) 50 MG tablet Take 1 tablet (50 mg total) by mouth daily. 11/19/16  Yes Biagio Borg, MD  B Complex-C (B-COMPLEX WITH VITAMIN C) tablet Take 1 tablet by mouth daily.   Yes [provider]  cyclobenzaprine (FLEXERIL) 10 MG tablet Take 20 mg by mouth 3 (three) times daily as needed for muscle spasms.  10/29/14  Yes [provider]  Loel Ro COMPLEX PO Take 1 tablet by mouth daily.   Yes [provider]  Ginseng 500 MG CAPS Take 1 capsule by mouth daily.   Yes [provider]  metFORMIN (GLUCOPHAGE) 500 MG tablet TAKE 2 TABLETS BY MOUTH Twice daily 12/25/16  Yes Biagio Borg, MD  Multiple Vitamins-Minerals (CENTRUM SILVER PO) Take 1 tablet by mouth daily.     Yes [provider]  oxycodone (ROXICODONE) 30 MG immediate release tablet 30 mg 5 times daily as needed for pain. 11/04/14  Yes Eugenie Filler, MD  rosuvastatin (CRESTOR) 40 MG tablet Take 1 tablet (40  mg total) by mouth daily. 11/19/16  Yes Biagio Borg, MD  sertraline (ZOLOFT) 100 MG tablet Take 2 tablets (200 mg total) by mouth daily. Yearly physical w/labs due in August 11/19/16  Yes Biagio Borg, MD  tiotropium (SPIRIVA) 18 MCG inhalation capsule Place 1 capsule (18 mcg total) into inhaler and inhale daily. 12/12/16  Yes Collene Gobble, MD  albuterol (PROVENTIL HFA;VENTOLIN HFA) 108 (90 Base) MCG/ACT inhaler Inhale 2 puffs into the lungs every 6 (six) hours as needed for wheezing or shortness of breath. Patient not taking: Reported on 04/24/2017 11/16/16   Biagio Borg, MD  clonazePAM (KLONOPIN) 1 MG tablet TAKE 1 TABLET BY MOUTH TWICE DAILY AS NEEDED FOR ANXIETY 12/19/16   Biagio Borg, MD  Dulaglutide (TRULICITY) 1.27 NT/7.0YF [facility] Use as directed weekly SQ 12/25/16  Biagio Borg, MD  HYDROcodone-acetaminophen [facility]) 5-325 MG tablet Take 1-2 tablets by mouth every 4 (four) hours as needed. Patient not taking: Reported on 04/24/2017 04/15/17   Elnora Morrison, MD  Ivermectin (SKLICE) 0.5 % LOTN Use as directed Patient not taking: Reported on 04/24/2017 12/25/16   Biagio Borg, MD  solifenacin (VESICARE) 5 MG tablet Take 1 tablet (5 mg total) by mouth daily. Patient not taking: Reported on 04/24/2017 11/19/16   Biagio Borg, MD    Family History Family History  Problem Relation Age of Onset  . Liver cancer Mother     Social History Social History   Tobacco Use  . Smoking status: Current Every Day Smoker    Packs/day: 1.00    Years: 50.00    Pack years: 50.00    Types: Cigarettes  . Smokeless tobacco: Never Used  Substance Use Topics  . Alcohol use: No  . Drug use: No     Allergies   Naproxen sodium; Sulfonamide derivatives; and Ace inhibitors   Review of Systems Review of Systems  Constitutional: Negative for fever.  Cardiovascular: Positive for syncope. Negative for chest pain.  Neurological: Positive for syncope.  All other systems reviewed and are  negative.    Physical Exam Updated Vital Signs SpO2 98%   Physical Exam  Constitutional: She is oriented to person, place, and time. She appears well-developed and well-nourished. No distress.  HENT:  Head: Normocephalic and atraumatic.  Mouth/Throat: Oropharynx is clear and moist.  Eyes: Conjunctivae and EOM are normal. Pupils are equal, round, and reactive to light.  Neck: Normal range of motion. Neck supple.  Cardiovascular: Normal rate, regular rhythm and normal heart sounds.  Pulmonary/Chest: Effort normal and breath sounds normal. No respiratory distress.  Abdominal: Soft. She exhibits no distension. There is no tenderness.  Musculoskeletal: Normal range of motion. She exhibits no edema or deformity.  Neurological: She is alert and oriented to person, place, and time.  Skin: Skin is warm and dry.  Psychiatric: She has a normal mood and affect.  Nursing note and vitals reviewed.    ED Treatments / Results  Labs (all labs ordered are listed, but only abnormal results are displayed) Labs Reviewed  BASIC METABOLIC PANEL - Abnormal; Notable for the following components:      Result Value   Glucose, Bld 132 (*)    BUN 23 (*)    Calcium 8.7 (*)    All other components within normal limits  CBC - Abnormal; Notable for the following components:   WBC 11.4 (*)    All other components within normal limits  URINALYSIS, ROUTINE W REFLEX MICROSCOPIC  CBG MONITORING, ED  I-STAT TROPONIN, ED    EKG  EKG Interpretation  Date/Time:  Tuesday April 24 2017 19:55:24 EST Ventricular Rate:  71 PR Interval:    QRS Duration: 109 QT Interval:  433 QTC Calculation: 471 R Axis:   -40 Text Interpretation:  Sinus rhythm Incomplete RBBB and LAFB Abnormal R-wave progression, early transition Confirmed by Dene Gentry [phone]) on 04/24/2017 8:00:39 PM       Radiology Ct Head Wo Contrast  Result Date: 04/24/2017 CLINICAL DATA:  72 year old with a witnessed syncopal episode earlier  today. EXAM: CT HEAD WITHOUT CONTRAST TECHNIQUE: Contiguous axial images were obtained from the base of the skull through the vertex without intravenous contrast. COMPARISON:  MRI brain 11/01/2014.  CT head 11/01/2014. FINDINGS: Brain: Ventricular system normal in size and appearance for age. No significant atrophy for age.  Minimal changes of small vessel disease of the white matter, unchanged. No mass lesion. No midline shift. No acute hemorrhage or hematoma. No extra-axial fluid collections. No evidence of acute infarction. Vascular: Minimal bilateral carotid siphon and moderate bilateral vertebral artery atherosclerosis. No hyperdense vessel. Skull: No skull fracture or other focal osseous abnormality involving the skull. Sinuses/Orbits: Visualized paranasal sinuses, bilateral mastoid air cells and bilateral middle ear cavities well-aerated. Visualized orbits and globes normal in appearance. Other: None. IMPRESSION: 1. No acute intracranial abnormality. 2. Minimal chronic microvascular ischemic changes of the white matter. Electronically Signed   By: Evangeline Dakin M.D.   On: 04/24/2017 20:40   Dg Chest Port 1 View  Result Date: 04/24/2017 CLINICAL DATA:  Syncope. EXAM: PORTABLE CHEST 1 VIEW COMPARISON:  04/15/2017. FINDINGS: Cardiac silhouette is normal in size. No mediastinal or hilar masses. Lungs are hyperexpanded. Lungs are clear. No pleural effusion or pneumothorax. Skeletal structures are demineralized but grossly intact. IMPRESSION: No acute cardiopulmonary disease. Electronically Signed   By: Lajean Manes M.D.   On: 04/24/2017 20:27    Procedures Procedures (including critical care time)  Medications Ordered in ED Medications - No data to display   Initial Impression / Assessment and Plan / ED Course  I have reviewed the triage vital signs and the nursing notes.  Pertinent labs & imaging results that were available during my care of the patient were reviewed by me and considered in my  medical decision making (see chart for details).     MDM   Screen complete  Patient is presenting following a syncopal event.  Patient now feels comfortable and is without specific acute complaint.  Initial workup does not reveal any significant abnormalities.  However, given reported history of syncope will admit for observation.  Hospitalist service is aware of case and will evaluate for admission.  Final Clinical Impressions(s) / ED Diagnoses   Final diagnoses:  Syncope, unspecified syncope type    ED Discharge Orders    None       Valarie Merino, MD 04/24/17 2351

## 2017-04-24 NOTE — ED Notes (Signed)
Bed: WA21 Expected date:  Expected time:  Means of arrival:  Comments: EMS syncope  

## 2017-04-25 ENCOUNTER — Observation Stay (HOSPITAL_COMMUNITY): Payer: Medicare HMO

## 2017-04-25 ENCOUNTER — Other Ambulatory Visit: Payer: Self-pay

## 2017-04-25 ENCOUNTER — Encounter (HOSPITAL_COMMUNITY): Payer: Self-pay | Admitting: *Deleted

## 2017-04-25 DIAGNOSIS — R55 Syncope and collapse: Principal | ICD-10-CM

## 2017-04-25 DIAGNOSIS — M25561 Pain in right knee: Secondary | ICD-10-CM | POA: Diagnosis not present

## 2017-04-25 DIAGNOSIS — S8991XA Unspecified injury of right lower leg, initial encounter: Secondary | ICD-10-CM | POA: Diagnosis not present

## 2017-04-25 LAB — URINALYSIS, ROUTINE W REFLEX MICROSCOPIC
Bilirubin Urine: NEGATIVE
Glucose, UA: 50 mg/dL — AB
Hgb urine dipstick: NEGATIVE
Ketones, ur: NEGATIVE mg/dL
LEUKOCYTES UA: NEGATIVE
NITRITE: NEGATIVE
PROTEIN: NEGATIVE mg/dL
Specific Gravity, Urine: 1.017 (ref 1.005–1.030)
pH: 5 (ref 5.0–8.0)

## 2017-04-25 LAB — PHOSPHORUS: PHOSPHORUS: 2.8 mg/dL (ref 2.5–4.6)

## 2017-04-25 LAB — MAGNESIUM: MAGNESIUM: 1.7 mg/dL (ref 1.7–2.4)

## 2017-04-25 LAB — RAPID URINE DRUG SCREEN, HOSP PERFORMED
AMPHETAMINES: NOT DETECTED
Barbiturates: NOT DETECTED
Benzodiazepines: NOT DETECTED
Cocaine: NOT DETECTED
OPIATES: POSITIVE — AB
Tetrahydrocannabinol: NOT DETECTED

## 2017-04-25 LAB — BASIC METABOLIC PANEL
Anion gap: 8 (ref 5–15)
BUN: 22 mg/dL — AB (ref 6–20)
CHLORIDE: 103 mmol/L (ref 101–111)
CO2: 24 mmol/L (ref 22–32)
CREATININE: 0.73 mg/dL (ref 0.44–1.00)
Calcium: 8.9 mg/dL (ref 8.9–10.3)
GFR calc Af Amer: >60 mL/min (ref >60)
GFR calc non Af Amer: >60 mL/min (ref >60)
GLUCOSE: 213 mg/dL — AB (ref 65–99)
POTASSIUM: 4.3 mmol/L (ref 3.5–5.1)
SODIUM: 135 mmol/L (ref 135–145)

## 2017-04-25 LAB — TROPONIN I
Troponin I: <0.03 ng/mL (ref <0.03)
Troponin I: <0.03 ng/mL (ref <0.03)

## 2017-04-25 LAB — GLUCOSE, CAPILLARY
GLUCOSE-CAPILLARY: 251 mg/dL — AB (ref 65–99)
Glucose-Capillary: 216 mg/dL — ABNORMAL HIGH (ref 65–99)
Glucose-Capillary: 211 mg/dL — ABNORMAL HIGH (ref 65–99)

## 2017-04-25 LAB — HEMOGLOBIN A1C
HEMOGLOBIN A1C: 7.2 % — AB (ref 4.8–5.6)
MEAN PLASMA GLUCOSE: 159.94 mg/dL

## 2017-04-25 LAB — CBG MONITORING, ED: GLUCOSE-CAPILLARY: 197 mg/dL — AB (ref 65–99)

## 2017-04-25 LAB — TSH: TSH: 0.438 u[IU]/mL (ref 0.350–4.500)

## 2017-04-25 MED ORDER — LACTATED RINGERS IV SOLN
INTRAVENOUS | Status: DC
  Administered 2017-04-25: 12:00:00 via INTRAVENOUS

## 2017-04-25 MED ORDER — ALBUTEROL SULFATE (2.5 MG/3ML) 0.083% IN NEBU: 2.5000 mg | INHALATION_SOLUTION | Freq: Four times a day (QID) | RESPIRATORY_TRACT | Status: DC | PRN

## 2017-04-25 MED ORDER — OXYCODONE HCL 5 MG PO TABS
15.0000 mg | ORAL_TABLET | Freq: Four times a day (QID) | ORAL | Status: DC | PRN
  Administered 2017-04-25 (×2): 15 mg via ORAL
  Filled 2017-04-25 (×2): qty 3

## 2017-04-25 MED ORDER — OXYCODONE HCL 5 MG PO TABS
10.0000 mg | ORAL_TABLET | ORAL | Status: DC | PRN
  Administered 2017-04-25 – 2017-04-26 (×5): 10 mg via ORAL
  Filled 2017-04-25 (×5): qty 2

## 2017-04-25 MED ORDER — METHOCARBAMOL 500 MG PO TABS
500.0000 mg | ORAL_TABLET | Freq: Three times a day (TID) | ORAL | Status: DC
  Administered 2017-04-25 – 2017-04-26 (×4): 500 mg via ORAL
  Filled 2017-04-25 (×4): qty 1

## 2017-04-25 MED ORDER — TIOTROPIUM BROMIDE MONOHYDRATE 18 MCG IN CAPS
18.0000 ug | ORAL_CAPSULE | Freq: Every day | RESPIRATORY_TRACT | Status: DC
  Administered 2017-04-26: 18 ug via RESPIRATORY_TRACT
  Filled 2017-04-25: qty 5

## 2017-04-25 MED ORDER — ALBUTEROL SULFATE HFA 108 (90 BASE) MCG/ACT IN AERS
2.0000 | INHALATION_SPRAY | Freq: Four times a day (QID) | RESPIRATORY_TRACT | Status: DC | PRN
  Filled 2017-04-25: qty 6.7

## 2017-04-25 MED ORDER — INSULIN GLARGINE 100 UNIT/ML ~~LOC~~ SOLN
5.0000 [IU] | Freq: Every day | SUBCUTANEOUS | Status: DC
  Administered 2017-04-25 – 2017-04-26 (×2): 5 [IU] via SUBCUTANEOUS
  Filled 2017-04-25 (×2): qty 0.05

## 2017-04-25 MED ORDER — CLONAZEPAM 0.5 MG PO TABS
0.5000 mg | ORAL_TABLET | Freq: Two times a day (BID) | ORAL | Status: DC | PRN
  Administered 2017-04-26: 0.5 mg via ORAL
  Filled 2017-04-25: qty 1

## 2017-04-25 MED ORDER — POLYETHYLENE GLYCOL 3350 17 G PO PACK: 17.0000 g | PACK | Freq: Every day | ORAL | Status: DC | PRN

## 2017-04-25 MED ORDER — INSULIN ASPART 100 UNIT/ML ~~LOC~~ SOLN
0.0000 [IU] | Freq: Three times a day (TID) | SUBCUTANEOUS | Status: DC
  Administered 2017-04-25: 5 [IU] via SUBCUTANEOUS
  Administered 2017-04-25: 3 [IU] via SUBCUTANEOUS
  Administered 2017-04-25 – 2017-04-26 (×3): 2 [IU] via SUBCUTANEOUS
  Filled 2017-04-25: qty 1

## 2017-04-25 MED ORDER — SENNA 8.6 MG PO TABS
1.0000 | ORAL_TABLET | Freq: Two times a day (BID) | ORAL | Status: DC
  Administered 2017-04-25 – 2017-04-26 (×3): 8.6 mg via ORAL
  Filled 2017-04-25 (×3): qty 1

## 2017-04-25 MED ORDER — ASPIRIN EC 325 MG PO TBEC
325.0000 mg | DELAYED_RELEASE_TABLET | Freq: Every day | ORAL | Status: DC
  Administered 2017-04-25 – 2017-04-26 (×2): 325 mg via ORAL
  Filled 2017-04-25 (×2): qty 1

## 2017-04-25 MED ORDER — ROSUVASTATIN CALCIUM 40 MG PO TABS
40.0000 mg | ORAL_TABLET | Freq: Every day | ORAL | Status: DC
  Administered 2017-04-25 – 2017-04-26 (×2): 40 mg via ORAL
  Filled 2017-04-25: qty 1
  Filled 2017-04-25 (×2): qty 4
  Filled 2017-04-25: qty 1

## 2017-04-25 MED ORDER — DOCUSATE SODIUM 100 MG PO CAPS
100.0000 mg | ORAL_CAPSULE | Freq: Two times a day (BID) | ORAL | Status: DC
  Administered 2017-04-25 – 2017-04-26 (×3): 100 mg via ORAL
  Filled 2017-04-25 (×3): qty 1

## 2017-04-25 MED ORDER — ENOXAPARIN SODIUM 40 MG/0.4ML ~~LOC~~ SOLN
40.0000 mg | SUBCUTANEOUS | Status: DC
  Administered 2017-04-25: 40 mg via SUBCUTANEOUS
  Filled 2017-04-25: qty 0.4

## 2017-04-25 MED ORDER — SERTRALINE HCL 100 MG PO TABS
200.0000 mg | ORAL_TABLET | Freq: Every day | ORAL | Status: DC
  Administered 2017-04-25 – 2017-04-26 (×2): 200 mg via ORAL
  Filled 2017-04-25 (×2): qty 2

## 2017-04-25 MED ORDER — HYDROMORPHONE HCL 1 MG/ML IJ SOLN
1.0000 mg | INTRAMUSCULAR | Status: AC
  Administered 2017-04-25: 1 mg via INTRAVENOUS
  Filled 2017-04-25: qty 1

## 2017-04-25 NOTE — Progress Notes (Signed)
04/25/17  [place] Tech coming to apply knee sleeve/wrap on right knee.

## 2017-04-25 NOTE — H&P (Signed)
History and Physical    AUDRIANNA DRISKILL ZOX:[id number] DOB: 1945/04/13 DOA: 04/24/2017  PCP: Biagio Borg, MD Patient coming from: Home  I have personally briefly reviewed patient's old medical records in [place]  Chief Complaint: Fall  HPI: Crystal Thornton is a 72 y.o. female with medical history significant for undocumented COPD, hypertension, type 2 diabetes mellitus, tobacco use, anxiety and prior seizure who presents to the ED after a witnessed syncopal event that occurred after she was walking out of a dollar store.  Per report the syncopal episode lasted approximately 5-10 seconds and was not associated with generalized shaking. She fell onto her right side and right knee. She is complaining of right knee discomfort.  She denies prodromal symptoms, denies palpitations, denies chest pain, denies shortness of breath, denies tunnel vision, denies tinnitus, denies headache, denies loss of bowel or bladder continence.  She denies post ictal type symptoms.  Patient states that she does get lightheaded when rising from a seated position quite frequently, although she denies the symptoms occurring today.  The event occurred when she was walking out of a warm store into the cold air per her son who is at bedside.  Patient denies any new medications, denies any drug use, denies alcohol use.  No recent infectious symptoms.  No recent decrease in oral intake.  Patient and son state that this is very distinct from her prior episode of seizure.  On chart review, it appears that patient seizure was in the setting of benzodiazepine withdrawal.   ED Course: Vitals not charted the ED at the time of admission, however they were reported to be normal.  Labs notable for WBC 11.4, Hgb 13.4, platelets 254, normal exercise and normal renal function, troponin minimally detectable at 0.01.  CT had showed no acute changes and chronic microvascular disease, chest x-ray showed no acute abnormality's.  EKG showed  incomplete right bundle branch block and left anterior fascicular block with poor rhythm tracing, however EKG appears to be sinus rhythm.  Patient received Dilaudid x1 in the ED.  Review of Systems: As per HPI otherwise 10 point review of systems negative.    Past Medical History:  Diagnosis Date  . ANGIOEDEMA 01/24/2010  . Anxiety   . ANXIETY 11/07/2006  . BACK PAIN 11/01/2007  . Cough 05/29/2008  . CYST, OVARIAN NEC/NOS 11/05/2006  . Depression   . DEPRESSION 11/07/2006  . Diabetes mellitus   . DIABETES MELLITUS, TYPE II 11/07/2006  . DIVERTICULOSIS, COLON 11/07/2006  . FIBROCYSTIC BREAST DISEASE 11/05/2006  . HEAD TRAUMA, CLOSED 01/24/2010  . HEPATITIS B, HX OF 11/05/2006  . Hepatitis C    Treated and no longer has  . HEPATITIS C 11/05/2006  . Hyperlipidemia   . HYPERLIPIDEMIA 11/07/2006  . Hypertension   . HYPERTENSION 05/29/2007  . INSOMNIA, HX OF 11/05/2006  . LOW BACK PAIN 05/29/2007  . Osteopenia   . OSTEOPENIA 11/07/2006  . Other specified sites of sprains and strains 08/31/2008  . Pain    R. shoulder and lower back and legs  . PULMONARY NODULE, LEFT UPPER LOBE 05/31/2009  . Seizures ([place])   . SHOULDER PAIN, RIGHT, CHRONIC 06/02/2010  . SINUSITIS- ACUTE-NOS 12/10/2007  . SKIN LESION 06/02/2010  . TOTAL ABDOMINAL HYSTERECTOMY, HX OF 03/27/1990  . Wears dentures    full top  . Wears glasses     Past Surgical History:  Procedure Laterality Date  . APPENDECTOMY    . CERVICAL FUSION  2013  .  CESAREAN SECTION    . COLONOSCOPY    . EUS N/A 07/29/2015   Procedure: UPPER ENDOSCOPIC ULTRASOUND (EUS) LINEAR;  Surgeon: Milus Banister, MD;  Location: [facility];  Service: Endoscopy;  Laterality: N/A;  . EXTERNAL FIXATION LEG Left 12/12/2012   Procedure: LEFT ADJUSTMENT OF EXTERNAL FIXATION UNDER ANESTHESIA;  Surgeon: Wylene Simmer, MD;  Location: [facility];  Service: Orthopedics;  Laterality: Left;  . LIVER BIOPSY    . ORIF ANKLE FRACTURE Left 12/03/2012  . ORIF ANKLE FRACTURE  Left 01/02/2013   Procedure: LEFT ANKLE REMOVAL OF EXTERNAL FIXATOR OPEN REDUCTION INTERNAL FIXATION (ORIF) TRIMALLEOLAR FRACTURE;  Surgeon: Wylene Simmer, MD;  Location: [facility];  Service: Orthopedics;  Laterality: Left;  . OVARIAN CYST SURGERY     ruptured  . SHOULDER ARTHROSCOPY  2008   left  . TUBAL LIGATION       reports that she has been smoking cigarettes.  She has a 50.00 pack-year smoking history. she has never used smokeless tobacco. She reports that she does not drink alcohol or use drugs.  Allergies  Allergen Reactions  . Naproxen Sodium Anaphylaxis  . Sulfonamide Derivatives Other (See Comments)    Causes nephritis  . Ace Inhibitors Swelling    Lip swelling    Family History  Problem Relation Age of Onset  . Liver cancer Mother     Prior to Admission medications   Medication Sig Start Date End Date Taking? Authorizing Provider  aspirin EC 325 MG tablet Take 325 mg by mouth daily.   Yes [provider]  atenolol (TENORMIN) 50 MG tablet Take 1 tablet (50 mg total) by mouth daily. 11/19/16  Yes Biagio Borg, MD  B Complex-C (B-COMPLEX WITH VITAMIN C) tablet Take 1 tablet by mouth daily.   Yes [provider]  cyclobenzaprine (FLEXERIL) 10 MG tablet Take 20 mg by mouth 3 (three) times daily as needed for muscle spasms.  10/29/14  Yes [provider]  Loel Ro COMPLEX PO Take 1 tablet by mouth daily.   Yes [provider]  Ginseng 500 MG CAPS Take 1 capsule by mouth daily.   Yes [provider]  metFORMIN (GLUCOPHAGE) 500 MG tablet TAKE 2 TABLETS BY MOUTH Twice daily 12/25/16  Yes Biagio Borg, MD  Multiple Vitamins-Minerals (CENTRUM SILVER PO) Take 1 tablet by mouth daily.     Yes [provider]  oxycodone (ROXICODONE) 30 MG immediate release tablet 30 mg 5 times daily as needed for pain. 11/04/14  Yes Eugenie Filler, MD  rosuvastatin (CRESTOR) 40 MG tablet Take 1 tablet (40 mg total) by mouth  daily. 11/19/16  Yes Biagio Borg, MD  sertraline (ZOLOFT) 100 MG tablet Take 2 tablets (200 mg total) by mouth daily. Yearly physical w/labs due in August 11/19/16  Yes Biagio Borg, MD  tiotropium (SPIRIVA) 18 MCG inhalation capsule Place 1 capsule (18 mcg total) into inhaler and inhale daily. 12/12/16  Yes Collene Gobble, MD  albuterol (PROVENTIL HFA;VENTOLIN HFA) 108 (90 Base) MCG/ACT inhaler Inhale 2 puffs into the lungs every 6 (six) hours as needed for wheezing or shortness of breath. Patient not taking: Reported on 04/24/2017 11/16/16   Biagio Borg, MD  clonazePAM (KLONOPIN) 1 MG tablet TAKE 1 TABLET BY MOUTH TWICE DAILY AS NEEDED FOR ANXIETY 12/19/16   Biagio Borg, MD  Dulaglutide (TRULICITY) 6.21 HY/8.6VH SOPN Use as directed weekly SQ 12/25/16   Biagio Borg, MD  HYDROcodone-acetaminophen (  NORCO) 5-325 MG tablet Take 1-2 tablets by mouth every 4 (four) hours as needed. Patient not taking: Reported on 04/24/2017 04/15/17   Elnora Morrison, MD  Ivermectin (SKLICE) 0.5 % LOTN Use as directed Patient not taking: Reported on 04/24/2017 12/25/16   Biagio Borg, MD  solifenacin (VESICARE) 5 MG tablet Take 1 tablet (5 mg total) by mouth daily. Patient not taking: Reported on 04/24/2017 11/19/16   Biagio Borg, MD    Physical Exam: Vitals:   04/24/17 1934  SpO2: 98%    Constitutional: NAD, calm, comfortable Vitals:   04/24/17 1934  SpO2: 98%   Eyes: PERRL, lids and conjunctivae normal ENMT: Mucous membranes are moist. Posterior pharynx clear of any exudate or lesions. Poor dentition.  Neck: normal, supple, no masses Respiratory: diffusely decreased breath sounds, no W/C/R Cardiovascular: Regular rate and rhythm, no murmurs / rubs / gallops. No extremity edema. 2+ pedal pulses. Abdomen: no tenderness, no masses palpated. Bowel sounds positive.  Musculoskeletal: no clubbing / cyanosis. No joint deformity upper and lower extremities. Good ROM, no contractures. Normal muscle tone.  Skin:  Abrasions on right hand and forearm, mild swelling of right knee without bruising or redness Neurologic: CN 2-12 grossly intact. Strength 5/5 in all 4.  Psychiatric: Normal judgment and insight. Alert and oriented x 3. Normal mood.   Labs on Admission: I have personally reviewed following labs and imaging studies  CBC: Recent Labs  Lab 04/24/17 2036  WBC 11.4*  HGB 13.4  HCT 40.3  MCV 93.5  PLT 937   Basic Metabolic Panel: Recent Labs  Lab 04/24/17 2036  NA 139  K 4.4  CL 108  CO2 26  GLUCOSE 132*  BUN 23*  CREATININE 0.76  CALCIUM 8.7*   GFR: CrCl cannot be calculated (Unknown ideal weight.). Liver Function Tests: No results for input(s): AST, ALT, ALKPHOS, BILITOT, PROT, ALBUMIN in the last 168 hours. No results for input(s): LIPASE, AMYLASE in the last 168 hours. No results for input(s): AMMONIA in the last 168 hours. Coagulation Profile: No results for input(s): INR, PROTIME in the last 168 hours. Cardiac Enzymes: No results for input(s): CKTOTAL, CKMB, CKMBINDEX, TROPONINI in the last 168 hours. BNP (last 3 results) No results for input(s): PROBNP in the last 8760 hours. HbA1C: No results for input(s): HGBA1C in the last 72 hours. CBG: No results for input(s): GLUCAP in the last 168 hours. Lipid Profile: No results for input(s): CHOL, HDL, LDLCALC, TRIG, CHOLHDL, LDLDIRECT in the last 72 hours. Thyroid Function Tests: No results for input(s): TSH, T4TOTAL, FREET4, T3FREE, THYROIDAB in the last 72 hours. Anemia Panel: No results for input(s): VITAMINB12, FOLATE, FERRITIN, TIBC, IRON, RETICCTPCT in the last 72 hours. Urine analysis:    Component Value Date/Time   COLORURINE YELLOW 12/21/2016 [place] 12/21/2016 1134   LABSPEC 1.020 12/21/2016 1134   PHURINE 7.0 12/21/2016 1134   GLUCOSEU 500 (A) 12/21/2016 1134   HGBUR NEGATIVE 12/21/2016 1134   BILIRUBINUR NEGATIVE 12/21/2016 1134   KETONESUR NEGATIVE 12/21/2016 1134   PROTEINUR  NEGATIVE 11/01/2014 1900   UROBILINOGEN 0.2 12/21/2016 1134   NITRITE NEGATIVE 12/21/2016 1134   LEUKOCYTESUR TRACE (A) 12/21/2016 1134    Radiological Exams on Admission: Dg Knee 2 Views Right  Result Date: 04/25/2017 CLINICAL DATA:  Right knee pain after a fall.  Pain is anterior. EXAM: RIGHT KNEE - 1-2 VIEW COMPARISON:  None. FINDINGS: No evidence of fracture, dislocation, or joint effusion. No evidence of arthropathy or other  focal bone abnormality. Soft tissues are unremarkable. IMPRESSION: Negative. Electronically Signed   By: Lucienne Capers M.D.   On: 04/25/2017 00:34   Ct Head Wo Contrast  Result Date: 04/24/2017 CLINICAL DATA:  72 year old with a witnessed syncopal episode earlier today. EXAM: CT HEAD WITHOUT CONTRAST TECHNIQUE: Contiguous axial images were obtained from the base of the skull through the vertex without intravenous contrast. COMPARISON:  MRI brain 11/01/2014.  CT head 11/01/2014. FINDINGS: Brain: Ventricular system normal in size and appearance for age. No significant atrophy for age. Minimal changes of small vessel disease of the white matter, unchanged. No mass lesion. No midline shift. No acute hemorrhage or hematoma. No extra-axial fluid collections. No evidence of acute infarction. Vascular: Minimal bilateral carotid siphon and moderate bilateral vertebral artery atherosclerosis. No hyperdense vessel. Skull: No skull fracture or other focal osseous abnormality involving the skull. Sinuses/Orbits: Visualized paranasal sinuses, bilateral mastoid air cells and bilateral middle ear cavities well-aerated. Visualized orbits and globes normal in appearance. Other: None. IMPRESSION: 1. No acute intracranial abnormality. 2. Minimal chronic microvascular ischemic changes of the white matter. Electronically Signed   By: Evangeline Dakin M.D.   On: 04/24/2017 20:40   Dg Chest Port 1 View  Result Date: 04/24/2017 CLINICAL DATA:  Syncope. EXAM: PORTABLE CHEST 1 VIEW COMPARISON:   04/15/2017. FINDINGS: Cardiac silhouette is normal in size. No mediastinal or hilar masses. Lungs are hyperexpanded. Lungs are clear. No pleural effusion or pneumothorax. Skeletal structures are demineralized but grossly intact. IMPRESSION: No acute cardiopulmonary disease. Electronically Signed   By: Lajean Manes M.D.   On: 04/24/2017 20:27    EKG: Independently reviewed. Incomplete RBBB and LAFB. Poor rhythm strip. Sinus rhythm. No ischemic changes.  Assessment/Plan Active Problems:   Syncope  Syncope Likely orthostatic versus vasovagal. Doubt arrhythmia although pt on beta blocker which could prompt bradycardia, particularly with her RBBB and LAFB noted on EKG. No seizure activity noted. No murmurs auscultated on exam. - Monitor on telemetry - Obtain orthostatic vitals - Hold beta blockade - Delta troponin - Obtain TTE - Volume resuscitate patient - If orthostatic, would consider discontinuing sertraline and transitioning to agent such as bupropion that does not have side effect of orthostatic hypotension; she is not taking Vesicare - Obtain UDS - Check B12 - PT eval for discharge planning, DME needs  Chronic medical conditions: - COPD: continue home inhaler regimen, maintain SpO2 >88% - HTN: holding atenolol as above - DM2: Lantus 5 U daily, SSI, continue statin, ASA - Anxiety: continue sertraline, PRN Klonopin to avoid withdrawal  DVT prophylaxis: Lovenox Code Status: Full Disposition Plan: Home in 1-2 days Consults called: None Admission status: Tele obs  Adam Sharene Butters MD [facility]  If 7PM-7AM, please contact night-coverage [email or web address] Password [facility]  04/25/2017, 12:57 AM

## 2017-04-25 NOTE — ED Notes (Signed)
CBG 197; awaiting meal tray and then will treat with insulin.

## 2017-04-25 NOTE — ED Notes (Signed)
ED TO INPATIENT HANDOFF REPORT  Name/Age/Gender Crystal Thornton 72 y.o. female  Code Status    Code Status Orders  (From admission, onward)        Start     Ordered   04/25/17 0715  Full code  Continuous     04/25/17 0714    Code Status History    Date Active Date Inactive Code Status Order ID Comments User Context   11/01/2014 05:29 11/04/2014 19:00 Full Code [id number]  Debbe Odea, MD ED   01/02/2013 16:13 01/03/2013 13:20 Full Code [id number]  Flowers, Judeth Cornfield, PA-C Inpatient      Home/SNF/Other Home  Chief Complaint syncope  Level of Care/Admitting Diagnosis ED Disposition    ED Disposition Condition Comment   [facility] [[id number]]  Level of Care: Telemetry [5]  Admit to tele based on following criteria: Eval of Syncope  Diagnosis: Syncope [532023]  Admitting Physician: Bennie Pierini [[id number]]  Attending Physician: Jonnie Finner, [place] [[id number]]  PT Class (Do Not Modify): Observation [104]  PT Acc Code (Do Not Modify): Observation [10022]       Medical History Past Medical History:  Diagnosis Date  . ANGIOEDEMA 01/24/2010  . Anxiety   . ANXIETY 11/07/2006  . BACK PAIN 11/01/2007  . Cough 05/29/2008  . CYST, OVARIAN NEC/NOS 11/05/2006  . Depression   . DEPRESSION 11/07/2006  . Diabetes mellitus   . DIABETES MELLITUS, TYPE II 11/07/2006  . DIVERTICULOSIS, COLON 11/07/2006  . FIBROCYSTIC BREAST DISEASE 11/05/2006  . HEAD TRAUMA, CLOSED 01/24/2010  . HEPATITIS B, HX OF 11/05/2006  . Hepatitis C    Treated and no longer has  . HEPATITIS C 11/05/2006  . Hyperlipidemia   . HYPERLIPIDEMIA 11/07/2006  . Hypertension   . HYPERTENSION 05/29/2007  . INSOMNIA, HX OF 11/05/2006  . LOW BACK PAIN 05/29/2007  . Osteopenia   . OSTEOPENIA 11/07/2006  . Other specified sites of sprains and strains 08/31/2008  . Pain    R. shoulder and lower back and legs  . PULMONARY NODULE, LEFT UPPER LOBE 05/31/2009  . Seizures ([place])   . SHOULDER  PAIN, RIGHT, CHRONIC 06/02/2010  . SINUSITIS- ACUTE-NOS 12/10/2007  . SKIN LESION 06/02/2010  . TOTAL ABDOMINAL HYSTERECTOMY, HX OF 03/27/1990  . Wears dentures    full top  . Wears glasses     Allergies Allergies  Allergen Reactions  . Naproxen Sodium Anaphylaxis  . Sulfonamide Derivatives Other (See Comments)    Causes nephritis  . Ace Inhibitors Swelling    Lip swelling    IV Location/Drains/Wounds Patient Lines/Drains/Airways Status   Active Line/Drains/Airways    Name:   Placement date:   Placement time:   Site:   Days:   Peripheral IV 04/24/17 Left Forearm   04/24/17    1955    Forearm   1   Incision 12/12/12 Leg Left   12/12/12    0944     1595   Incision 01/02/13 Leg Left   01/02/13    1345     1574          Labs/Imaging Results for orders placed or performed during the hospital encounter of 04/24/17 (from the past 48 hour(s))  Basic metabolic panel     Status: Abnormal   Collection Time: 04/24/17  8:36 PM  Result Value Ref Range   Sodium 139 135 - 145 mmol/L   Potassium 4.4 3.5 - 5.1 mmol/L   Chloride 108 101 - 111  mmol/L   CO2 26 22 - 32 mmol/L   Glucose, Bld 132 (H) 65 - 99 mg/dL   BUN 23 (H) 6 - 20 mg/dL   Creatinine, Ser 0.76 0.44 - 1.00 mg/dL   Calcium 8.7 (L) 8.9 - 10.3 mg/dL   GFR calc non Af Amer >60 >60 mL/min   GFR calc Af Amer >60 >60 mL/min    Comment: (NOTE) The eGFR has been calculated using the CKD EPI equation. This calculation has not been validated in all clinical situations. eGFR's persistently <60 mL/min signify possible Chronic Kidney Disease.    Anion gap 5 5 - 15  CBC     Status: Abnormal   Collection Time: 04/24/17  8:36 PM  Result Value Ref Range   WBC 11.4 (H) 4.0 - 10.5 K/uL   RBC 4.31 3.87 - 5.11 MIL/uL   Hemoglobin 13.4 12.0 - 15.0 g/dL   HCT 40.3 36.0 - 46.0 %   MCV 93.5 78.0 - 100.0 fL   MCH 31.1 26.0 - 34.0 pg   MCHC 33.3 30.0 - 36.0 g/dL   RDW 14.2 11.5 - 15.5 %   Platelets 254 150 - 400 K/uL  I-stat troponin, ED      Status: None   Collection Time: 04/24/17  8:47 PM  Result Value Ref Range   Troponin i, poc 0.01 0.00 - 0.08 ng/mL   Comment 3            Comment: Due to the release kinetics of cTnI, a negative result within the first hours of the onset of symptoms does not rule out myocardial infarction with certainty. If myocardial infarction is still suspected, repeat the test at appropriate intervals.   Magnesium     Status: None   Collection Time: 04/25/17  7:29 AM  Result Value Ref Range   Magnesium 1.7 1.7 - 2.4 mg/dL  Phosphorus     Status: None   Collection Time: 04/25/17  7:29 AM  Result Value Ref Range   Phosphorus 2.8 2.5 - 4.6 mg/dL  TSH     Status: None   Collection Time: 04/25/17  7:29 AM  Result Value Ref Range   TSH 0.438 0.350 - 4.500 uIU/mL    Comment: Performed by a 3rd Generation assay with a functional sensitivity of <=0.01 uIU/mL.  Hemoglobin A1c     Status: Abnormal   Collection Time: 04/25/17  7:29 AM  Result Value Ref Range   Hgb A1c MFr Bld 7.2 (H) 4.8 - 5.6 %    Comment: (NOTE) Pre diabetes:          5.7%-6.4% Diabetes:              >6.4% Glycemic control for   <7.0% adults with diabetes    Mean Plasma Glucose 159.94 mg/dL    Comment: Performed at [facility] [address] [place], [place] [address]  Troponin I     Status: None   Collection Time: 04/25/17  7:29 AM  Result Value Ref Range   Troponin I <0.03 <0.03 ng/mL  Basic metabolic panel     Status: Abnormal   Collection Time: 04/25/17  7:29 AM  Result Value Ref Range   Sodium 135 135 - 145 mmol/L   Potassium 4.3 3.5 - 5.1 mmol/L   Chloride 103 101 - 111 mmol/L   CO2 24 22 - 32 mmol/L   Glucose, Bld 213 (H) 65 - 99 mg/dL   BUN 22 (H) 6 - 20 mg/dL  Creatinine, Ser 0.73 0.44 - 1.00 mg/dL   Calcium 8.9 8.9 - 10.3 mg/dL   GFR calc non Af Amer >60 >60 mL/min   GFR calc Af Amer >60 >60 mL/min    Comment: (NOTE) The eGFR has been calculated using the CKD EPI equation. This calculation has not  been validated in all clinical situations. eGFR's persistently <60 mL/min signify possible Chronic Kidney Disease.    Anion gap 8 5 - 15  CBG monitoring, ED     Status: Abnormal   Collection Time: 04/25/17  8:40 AM  Result Value Ref Range   Glucose-Capillary 197 (H) 65 - 99 mg/dL   Dg Knee 2 Views Right  Result Date: 04/25/2017 CLINICAL DATA:  Right knee pain after a fall.  Pain is anterior. EXAM: RIGHT KNEE - 1-2 VIEW COMPARISON:  None. FINDINGS: No evidence of fracture, dislocation, or joint effusion. No evidence of arthropathy or other focal bone abnormality. Soft tissues are unremarkable. IMPRESSION: Negative. Electronically Signed   By: Lucienne Capers M.D.   On: 04/25/2017 00:34   Ct Head Wo Contrast  Result Date: 04/24/2017 CLINICAL DATA:  72 year old with a witnessed syncopal episode earlier today. EXAM: CT HEAD WITHOUT CONTRAST TECHNIQUE: Contiguous axial images were obtained from the base of the skull through the vertex without intravenous contrast. COMPARISON:  MRI brain 11/01/2014.  CT head 11/01/2014. FINDINGS: Brain: Ventricular system normal in size and appearance for age. No significant atrophy for age. Minimal changes of small vessel disease of the white matter, unchanged. No mass lesion. No midline shift. No acute hemorrhage or hematoma. No extra-axial fluid collections. No evidence of acute infarction. Vascular: Minimal bilateral carotid siphon and moderate bilateral vertebral artery atherosclerosis. No hyperdense vessel. Skull: No skull fracture or other focal osseous abnormality involving the skull. Sinuses/Orbits: Visualized paranasal sinuses, bilateral mastoid air cells and bilateral middle ear cavities well-aerated. Visualized orbits and globes normal in appearance. Other: None. IMPRESSION: 1. No acute intracranial abnormality. 2. Minimal chronic microvascular ischemic changes of the white matter. Electronically Signed   By: Evangeline Dakin M.D.   On: 04/24/2017 20:40   Dg  Chest Port 1 View  Result Date: 04/24/2017 CLINICAL DATA:  Syncope. EXAM: PORTABLE CHEST 1 VIEW COMPARISON:  04/15/2017. FINDINGS: Cardiac silhouette is normal in size. No mediastinal or hilar masses. Lungs are hyperexpanded. Lungs are clear. No pleural effusion or pneumothorax. Skeletal structures are demineralized but grossly intact. IMPRESSION: No acute cardiopulmonary disease. Electronically Signed   By: Lajean Manes M.D.   On: 04/24/2017 20:27    Pending Labs Unresulted Labs (From admission, onward)   Start     Ordered   04/26/17 0500  CBC  Daily,   R     04/25/17 0714   04/26/17 0500  Magnesium  Daily,   R     04/25/17 0714   04/25/17 0714  Troponin I  Now then every 6 hours,   R     04/25/17 0714   04/25/17 0014  Urine rapid drug screen (hosp performed)  STAT,   R     04/25/17 0014   04/24/17 1944  Urinalysis, Routine w reflex microscopic  Once,   STAT     04/24/17 1944   Signed and Held  Vitamin B12  Once,   R     Signed and Held      Vitals/Pain Today's Vitals   04/25/17 0909 04/25/17 0912 04/25/17 0912 04/25/17 1010  BP:  (!) 145/77  120/67  Pulse:  75  74  Resp:  (!) 24  (!) 27  Temp:    97.9 F (36.6 C)  TempSrc:    Oral  SpO2:  96%  96%  PainSc: '8   8  8     ' Isolation Precautions No active isolations  Medications Medications  albuterol (PROVENTIL HFA;VENTOLIN HFA) 108 (90 Base) MCG/ACT inhaler 2 puff (not administered)  aspirin EC tablet 325 mg (not administered)  clonazePAM (KLONOPIN) tablet 0.5 mg (not administered)  oxyCODONE (Oxy IR/ROXICODONE) immediate release tablet 15 mg (15 mg Oral Given 04/25/17 0909)  rosuvastatin (CRESTOR) tablet 40 mg (not administered)  sertraline (ZOLOFT) tablet 200 mg (not administered)  tiotropium (SPIRIVA) inhalation capsule 18 mcg ( Inhalation Canceled Entry 04/25/17 1000)  enoxaparin (LOVENOX) injection 40 mg (not administered)  lactated ringers infusion (not administered)  docusate sodium (COLACE) capsule 100 mg (not  administered)  senna (SENOKOT) tablet 8.6 mg (not administered)  polyethylene glycol (MIRALAX / GLYCOLAX) packet 17 g (not administered)  insulin glargine (LANTUS) injection 5 Units (not administered)  insulin aspart (novoLOG) injection 0-9 Units (2 Units Subcutaneous Given 04/25/17 0909)  HYDROmorphone (DILAUDID) injection 1 mg (1 mg Intravenous Given 04/25/17 0118)    Mobility walks

## 2017-04-25 NOTE — Progress Notes (Signed)
04/25/17  1610  Paged Echo [phone]. Spoke with Apolonio Schneiders and echo will be done tomorrow per echo tech.

## 2017-04-25 NOTE — Progress Notes (Signed)
Patient seen and evaluated, chart reviewed, please see EMR for updated orders. Please see full H&P dictated by admitting physician Dr. Jonnie Finner for same date of service.   1)Syncope-watch for arrhythmias on Telementry unit, check serial troponins and EKG for rule out ACS protocol, check echocardiogram to rule out significant aortic stenosis or other outflow obstruction, and also to evaluate EF and to rule out segmental/Regional wall motion abnormalities. Check carotid artery Dopplers to rule out hemodynamically significant stenosis  2)Rt Knee Pain- xrays Neg, RICE protocol,  Apply a compressive ACE bandage. Rest and elevate the affected painful area.  Apply cold compresses intermittently as needed.      Patient seen and evaluated, chart reviewed, please see EMR for updated orders. Please see full H&P dictated by admitting physician Dr. Jonnie Finner for same date of service.

## 2017-04-25 NOTE — Plan of Care (Signed)
  Nutrition: Adequate nutrition will be maintained 04/25/2017 1631 - Progressing by Dorene Sorrow, RN   Activity: Risk for activity intolerance will decrease 04/25/2017 1631 - Progressing by Dorene Sorrow, RN   Skin Integrity: Risk for impaired skin integrity will decrease 04/25/2017 1631 - Progressing by Dorene Sorrow, RN

## 2017-04-25 NOTE — ED Notes (Signed)
Per pt "they had me on oxygen since I've been here." Pt hx of COPD. Pt denies oxygen use at home.

## 2017-04-25 NOTE — ED Notes (Signed)
Pt oxygen taken off. Pt eating breakfast. Will continue to monitor VS.

## 2017-04-26 ENCOUNTER — Observation Stay (HOSPITAL_BASED_OUTPATIENT_CLINIC_OR_DEPARTMENT_OTHER): Payer: Medicare HMO

## 2017-04-26 ENCOUNTER — Observation Stay (HOSPITAL_COMMUNITY): Payer: Medicare HMO

## 2017-04-26 DIAGNOSIS — R55 Syncope and collapse: Secondary | ICD-10-CM

## 2017-04-26 LAB — GLUCOSE, CAPILLARY
Glucose-Capillary: 157 mg/dL — ABNORMAL HIGH (ref 65–99)
Glucose-Capillary: 171 mg/dL — ABNORMAL HIGH (ref 65–99)

## 2017-04-26 LAB — ECHOCARDIOGRAM COMPLETE
HEIGHTINCHES: 67 in
Weight: 2253.98 oz

## 2017-04-26 LAB — CBC
HEMATOCRIT: 35.9 % — AB (ref 36.0–46.0)
HEMOGLOBIN: 12.2 g/dL (ref 12.0–15.0)
MCH: 30.8 pg (ref 26.0–34.0)
MCHC: 34 g/dL (ref 30.0–36.0)
MCV: 90.7 fL (ref 78.0–100.0)
Platelets: 219 10*3/uL (ref 150–400)
RBC: 3.96 MIL/uL (ref 3.87–5.11)
RDW: 14.1 % (ref 11.5–15.5)
WBC: 11 10*3/uL — ABNORMAL HIGH (ref 4.0–10.5)

## 2017-04-26 LAB — MAGNESIUM: Magnesium: 1.8 mg/dL (ref 1.7–2.4)

## 2017-04-26 MED ORDER — METHOCARBAMOL 750 MG PO TABS: 750.0000 mg | ORAL_TABLET | Freq: Three times a day (TID) | ORAL | 0 refills | Status: DC

## 2017-04-26 NOTE — Discharge Summary (Signed)
Crystal Thornton, is a 72 y.o. female  DOB Sep 26, 1945  MRN [id number].  Admission date:  04/24/2017  Admitting Physician  Bennie Pierini, MD  Discharge Date:  04/26/2017   Primary MD  Biagio Borg, MD  Recommendations for primary care physician for things to follow:   1)Use walker for Ambulation to Avoid falling 2)Apply a compressive ACE bandage to Right Knee. Rest and elevate the  Right Knee.  Apply cold compresses intermittently as needed.  As pain recedes, begin normal activities slowly as tolerated.  Call if symptoms persist.  3)Take medications as prescribed 4)if right knee pain does not get better,  follow-up with  primary care doctor for further evaluation and possible repeat imaging and Possible referral to orthopedic clinic 5)Quit Smoking !!!  Admission Diagnosis  Syncope, unspecified syncope type [R55]   Discharge Diagnosis  Syncope, unspecified syncope type [R55]    Principal Problem:   Syncope Active Problems:   HEPATITIS C   Diabetes ([place])   Essential hypertension   Chronic pain syndrome   COPD (chronic obstructive pulmonary disease) ([place])      Past Medical History:  Diagnosis Date  . ANGIOEDEMA 01/24/2010  . Anxiety   . ANXIETY 11/07/2006  . BACK PAIN 11/01/2007  . Cough 05/29/2008  . CYST, OVARIAN NEC/NOS 11/05/2006  . Depression   . DEPRESSION 11/07/2006  . Diabetes mellitus   . DIABETES MELLITUS, TYPE II 11/07/2006  . DIVERTICULOSIS, COLON 11/07/2006  . FIBROCYSTIC BREAST DISEASE 11/05/2006  . HEAD TRAUMA, CLOSED 01/24/2010  . HEPATITIS B, HX OF 11/05/2006  . Hepatitis C    Treated and no longer has  . HEPATITIS C 11/05/2006  . Hyperlipidemia   . HYPERLIPIDEMIA 11/07/2006  . Hypertension   . HYPERTENSION 05/29/2007  . INSOMNIA, HX OF 11/05/2006  . LOW BACK PAIN 05/29/2007  . Osteopenia   . OSTEOPENIA 11/07/2006  . Other specified sites of sprains and strains 08/31/2008  . Pain      R. shoulder and lower back and legs  . PULMONARY NODULE, LEFT UPPER LOBE 05/31/2009  . Seizures ([place])   . SHOULDER PAIN, RIGHT, CHRONIC 06/02/2010  . SINUSITIS- ACUTE-NOS 12/10/2007  . SKIN LESION 06/02/2010  . TOTAL ABDOMINAL HYSTERECTOMY, HX OF 03/27/1990  . Wears dentures    full top  . Wears glasses     Past Surgical History:  Procedure Laterality Date  . APPENDECTOMY    . CERVICAL FUSION  2013  . CESAREAN SECTION    . COLONOSCOPY    . EUS N/A 07/29/2015   Procedure: UPPER ENDOSCOPIC ULTRASOUND (EUS) LINEAR;  Surgeon: Milus Banister, MD;  Location: [facility];  Service: Endoscopy;  Laterality: N/A;  . EXTERNAL FIXATION LEG Left 12/12/2012   Procedure: LEFT ADJUSTMENT OF EXTERNAL FIXATION UNDER ANESTHESIA;  Surgeon: Wylene Simmer, MD;  Location: [facility];  Service: Orthopedics;  Laterality: Left;  . LIVER BIOPSY    . ORIF ANKLE FRACTURE Left 12/03/2012  . ORIF ANKLE FRACTURE Left 01/02/2013   Procedure: LEFT  ANKLE REMOVAL OF EXTERNAL FIXATOR OPEN REDUCTION INTERNAL FIXATION (ORIF) TRIMALLEOLAR FRACTURE;  Surgeon: Wylene Simmer, MD;  Location: [facility];  Service: Orthopedics;  Laterality: Left;  . OVARIAN CYST SURGERY     ruptured  . SHOULDER ARTHROSCOPY  2008   left  . TUBAL LIGATION       HPI  from the history and physical done on the day of admission:    HPI: Crystal Thornton is a 72 y.o. female with medical history significant for undocumented COPD, hypertension, type 2 diabetes mellitus, tobacco use, anxiety and prior seizure who presents to the ED after a witnessed syncopal event that occurred after she was walking out of a dollar store.  Per report the syncopal episode lasted approximately 5-10 seconds and was not associated with generalized shaking. She fell onto her right side and right knee. She is complaining of right knee discomfort.  She denies prodromal symptoms, denies palpitations, denies chest pain, denies shortness of breath, denies tunnel  vision, denies tinnitus, denies headache, denies loss of bowel or bladder continence.  She denies post ictal type symptoms.  Patient states that she does get lightheaded when rising from a seated position quite frequently, although she denies the symptoms occurring today.  The event occurred when she was walking out of a warm store into the cold air per her son who is at bedside.  Patient denies any new medications, denies any drug use, denies alcohol use.  No recent infectious symptoms.  No recent decrease in oral intake.  Patient and son state that this is very distinct from her prior episode of seizure.  On chart review, it appears that patient seizure was in the setting of benzodiazepine withdrawal.   ED Course: Vitals not charted the ED at the time of admission, however they were reported to be normal.  Labs notable for WBC 11.4, Hgb 13.4, platelets 254, normal exercise and normal renal function, troponin minimally detectable at 0.01.  CT had showed no acute changes and chronic microvascular disease, chest x-ray showed no acute abnormality's.  EKG showed incomplete right bundle branch block and left anterior fascicular block with poor rhythm tracing, however EKG appears to be sinus rhythm.  Patient received Dilaudid x1 in the ED.   Hospital Course:   1)Syncope-   on Telementry unit no significant arrhythmias, serial troponins negative, patient has ruled out for ACS.  Echocardiogram w/o significant aortic stenosis or other outflow obstruction,   EF is preserved at over 60%, and no segmental/Regional wall motion abnormalities. Carotid artery Dopplers  without hemodynamically significant stenosis.  Orthostatic vitals on day of admission were unremarkable, patient was on atenolol at home but no significant bradycardia noted, but there was left anterior fascicular block on EKG however.  Physical therapy evaluation appreciated.  UDS was positive for opiates which are prescribed for patient  2)Rt Knee Pain-  xrays Neg, RICE protocol,  Apply a compressive ACE bandage. Rest and elevate the affected painful area.  Apply cold compresses intermittently as needed  3)Tobacco Abuse-smoking cessation advised  4) chronic pain syndrome-resume home regimen  5)DM-resume home regiment dietary and lifestyle modifications discussed  6)HTN-stable, okay to resume atenolol 50 mg daily, no bradycardia or orthostatic hypotension   7)COPD-no acute flareup at this time, continue bronchodilators   8) Anxiety: continue sertraline, clonazepam  Discharge Condition: stable  Follow UP- PCP   Consults obtained - Phy Therapy  Diet and Activity recommendation:  As advised  Discharge Instructions     Discharge  Instructions    Call MD for:   Complete by:  As directed    Call MD for:  difficulty breathing, headache or visual disturbances   Complete by:  As directed    Call MD for:  persistant dizziness or light-headedness   Complete by:  As directed    Call MD for:  persistant nausea and vomiting   Complete by:  As directed    Call MD for:  severe uncontrolled pain   Complete by:  As directed    Call MD for:  temperature >100.4   Complete by:  As directed    Diet - low sodium heart healthy   Complete by:  As directed    Discharge instructions   Complete by:  As directed    1)Use walker for ambulation to avoid falling 2)Apply a compressive ACE bandage to Right Knee. Rest and elevate the  Right Knee.  Apply cold compresses intermittently as needed.  As pain recedes, begin normal activities slowly as tolerated.  Call if symptoms persist.  3)Take medications as prescribed 4) if right knee pain does not get better,  follow-up with  primary care doctor for further evaluation and possible repeat imaging and Possible referral to orthopedic clinic 5)Quit Smoking !!!   Increase activity slowly   Complete by:  As directed         Discharge Medications     Allergies as of 04/26/2017      Reactions   Naproxen  Sodium Anaphylaxis   Sulfonamide Derivatives Other (See Comments)   Causes nephritis   Ace Inhibitors Swelling   Lip swelling      Medication List    TAKE these medications   albuterol 108 (90 Base) MCG/ACT inhaler Commonly known as:  PROVENTIL HFA;VENTOLIN HFA Inhale 2 puffs into the lungs every 6 (six) hours as needed for wheezing or shortness of breath.   aspirin EC 325 MG tablet Take 325 mg by mouth daily.   atenolol 50 MG tablet Commonly known as:  TENORMIN Take 1 tablet (50 mg total) by mouth daily.   B-complex with vitamin C tablet Take 1 tablet by mouth daily.   CENTRUM SILVER PO Take 1 tablet by mouth daily.   clonazePAM 1 MG tablet Commonly known as:  KLONOPIN TAKE 1 TABLET BY MOUTH TWICE DAILY AS NEEDED FOR ANXIETY   cyclobenzaprine 10 MG tablet Commonly known as:  FLEXERIL Take 20 mg by mouth 3 (three) times daily as needed for muscle spasms.   Dulaglutide 0.75 MG/0.5ML Sopn Commonly known as:  TRULICITY Use as directed weekly SQ   GINKGO BILOBA COMPLEX PO Take 1 tablet by mouth daily.   Ginseng 500 MG Caps Take 1 capsule by mouth daily.   HYDROcodone-acetaminophen 5-325 MG tablet Commonly known as:  NORCO Take 1-2 tablets by mouth every 4 (four) hours as needed.   Ivermectin 0.5 % Lotn Commonly known as:  SKLICE Use as directed   metFORMIN 500 MG tablet Commonly known as:  GLUCOPHAGE TAKE 2 TABLETS BY MOUTH Twice daily   oxycodone 30 MG immediate release tablet Commonly known as:  ROXICODONE 30 mg 5 times daily as needed for pain.   rosuvastatin 40 MG tablet Commonly known as:  CRESTOR Take 1 tablet (40 mg total) by mouth daily.   sertraline 100 MG tablet Commonly known as:  ZOLOFT Take 2 tablets (200 mg total) by mouth daily. Yearly physical w/labs due in August   solifenacin 5 MG tablet Commonly known as:  VESICARE  Take 1 tablet (5 mg total) by mouth daily.   tiotropium 18 MCG inhalation capsule Commonly known as:   SPIRIVA Place 1 capsule (18 mcg total) into inhaler and inhale daily.            Durable Medical Equipment  (From admission, onward)        Start     Ordered   04/26/17 1432  For home use only DME Walker rolling  [facility])  Once    Question:  Patient needs a walker to treat with the following condition  Answer:  Knee derangement   04/26/17 1435      Major procedures and Radiology Reports - PLEASE review detailed and final reports for all details, in brief -    Dg Ribs Unilateral W/chest Right  Result Date: 04/15/2017 CLINICAL DATA:  73 year old female with right-sided rib pain after falling 4 days ago EXAM: RIGHT RIBS AND CHEST - 3+ VIEW COMPARISON:  Prior chest x-ray 11/16/2016 FINDINGS: Probable nondisplaced fractures through the posterolateral aspect of right ribs 10 and 11. There is no evidence of pneumothorax or pleural effusion. Both lungs are clear. Heart size and mediastinal contours are within normal limits. Atherosclerotic calcifications again noted in the transverse aorta. Stable mild bronchitic changes. Incompletely imaged anterior cervical fusion hardware and interbody grafts. IMPRESSION: Probable nondisplaced fractures through the posterolateral aspects of right ribs 10 and 11 seen on a single view. No acute cardiopulmonary process. Electronically Signed   By: Jacqulynn Cadet M.D.   On: 04/15/2017 16:48   Dg Knee 2 Views Right  Result Date: 04/25/2017 CLINICAL DATA:  Right knee pain after a fall.  Pain is anterior. EXAM: RIGHT KNEE - 1-2 VIEW COMPARISON:  None. FINDINGS: No evidence of fracture, dislocation, or joint effusion. No evidence of arthropathy or other focal bone abnormality. Soft tissues are unremarkable. IMPRESSION: Negative. Electronically Signed   By: Lucienne Capers M.D.   On: 04/25/2017 00:34   Ct Head Wo Contrast  Result Date: 04/24/2017 CLINICAL DATA:  72 year old with a witnessed syncopal episode earlier today. EXAM: CT HEAD WITHOUT CONTRAST  TECHNIQUE: Contiguous axial images were obtained from the base of the skull through the vertex without intravenous contrast. COMPARISON:  MRI brain 11/01/2014.  CT head 11/01/2014. FINDINGS: Brain: Ventricular system normal in size and appearance for age. No significant atrophy for age. Minimal changes of small vessel disease of the white matter, unchanged. No mass lesion. No midline shift. No acute hemorrhage or hematoma. No extra-axial fluid collections. No evidence of acute infarction. Vascular: Minimal bilateral carotid siphon and moderate bilateral vertebral artery atherosclerosis. No hyperdense vessel. Skull: No skull fracture or other focal osseous abnormality involving the skull. Sinuses/Orbits: Visualized paranasal sinuses, bilateral mastoid air cells and bilateral middle ear cavities well-aerated. Visualized orbits and globes normal in appearance. Other: None. IMPRESSION: 1. No acute intracranial abnormality. 2. Minimal chronic microvascular ischemic changes of the white matter. Electronically Signed   By: Evangeline Dakin M.D.   On: 04/24/2017 20:40   Dg Chest Port 1 View  Result Date: 04/24/2017 CLINICAL DATA:  Syncope. EXAM: PORTABLE CHEST 1 VIEW COMPARISON:  04/15/2017. FINDINGS: Cardiac silhouette is normal in size. No mediastinal or hilar masses. Lungs are hyperexpanded. Lungs are clear. No pleural effusion or pneumothorax. Skeletal structures are demineralized but grossly intact. IMPRESSION: No acute cardiopulmonary disease. Electronically Signed   By: Lajean Manes M.D.   On: 04/24/2017 20:27    Micro Results   No results found for this or any previous visit (  from the past 240 hour(s)).   Today   Subjective    Crystal Thornton today has no new c/o, Rt Knee pain persist, no cp, no DOE          Patient has been seen and examined prior to discharge   Objective   Blood pressure (!) 146/90, pulse 92, temperature 97.7 F (36.5 C), temperature source Oral, resp. rate 20, height 5\' 7"   (1.702 m), weight 63.9 kg (140 lb 14 oz), SpO2 95 %.   Intake/Output Summary (Last 24 hours) at 04/26/2017 1456 Last data filed at 04/26/2017 0900 Gross per 24 hour  Intake 558.75 ml  Output -  Net 558.75 ml   Exam Gen:- Awake  In no apparent distress  HEENT:- [place].AT,   Neck-Supple Neck,No JVD,  Lungs- mostly clear  CV- S1, S2 normal Abd-  +ve B.Sounds, Abd Soft, No tenderness,    Extremity/Skin:- Intact peripheral pulses  , Rt Knee pain with ROM and with palpation, overlying skin without erythema or significant bruising or open wounds   Data Review   CBC w Diff:  Lab Results  Component Value Date   WBC 11.0 (H) 04/26/2017   HGB 12.2 04/26/2017   HCT 35.9 (L) 04/26/2017   PLT 219 04/26/2017   LYMPHOPCT 16.1 12/21/2016   MONOPCT 7.8 12/21/2016   EOSPCT 2.9 12/21/2016   BASOPCT 0.4 12/21/2016    CMP:  Lab Results  Component Value Date   NA 135 04/25/2017   K 4.3 04/25/2017   CL 103 04/25/2017   CO2 24 04/25/2017   BUN 22 (H) 04/25/2017   CREATININE 0.73 04/25/2017   PROT 6.4 12/21/2016   ALBUMIN 3.6 12/21/2016   BILITOT 0.3 12/21/2016   ALKPHOS 147 (H) 12/21/2016   AST 26 12/21/2016   ALT 28 12/21/2016  .   Total Discharge time is about 33 minutes  Roxan Hockey M.D on 04/26/2017 at 2:56 PM  [facility]   Office  [phone]  Voice Recognition Viviann Spare dictation system was used to create this note, attempts have been made to correct errors. Please contact the author with questions and/or clarifications.

## 2017-04-26 NOTE — Progress Notes (Signed)
04/26/17 Call PT Crystal Thornton [phone]. Per Crystal Thornton, she will get a therapist here to see patient.

## 2017-04-26 NOTE — Clinical Social Work Note (Signed)
Clinical Social Work Assessment  Patient Details  Name: Crystal Thornton MRN: [id number] Date of Birth: [birth year]  Date of referral:  04/26/17               Reason for consult:  Housing Concerns/Homelessness                Permission sought to share information with:    Permission granted to share information::     Name::        Agency::     Relationship::     Contact Information:     Housing/Transportation Living arrangements for the past 2 months:  Hotel/Motel Source of Information:  Patient Patient Interpreter Needed:  None Criminal Activity/Legal Involvement Pertinent to Current Situation/Hospitalization:  No - Comment as needed Significant Relationships:  Adult Children Lives with:  Adult Children Do you feel safe going back to the place where you live?  Yes Need for family participation in patient care:  No (Coment)  Care giving concerns:  CSW consulted for homelessness. Patient reported that she stays in hotels with her son and son's girlfriend. Patient reported no care giving concerns.    Social Worker assessment / plan:  CSW spoke with patient at bedside regarding homelessness consult, patient accompanied by patient's son's girlfriend. Patient granted patient's son's girlfriend permission to stay in room during assessment. Patient reported that she has been having housing issues for 6 months. Patient reported that she stayed in a mobile home park for 28 years and was evicted when they "upgraded the community". Patient reported she then moved into a house where a friend was supposed to move in to assist with rent. Patient reported that when the friend did not move in she was unable to stay and pay the rent. CSW inquired about patient's current living situation/finances. Patient reported that she currently stays in hotels and receives approx. 1638/month in SSI. CSW inquired about how patient uses income and plans for housing move forward. Patient reported that the hotels take all  her income and that she is unsure about where she is going from the hospital. CSW suggested that patient consider other hotels to save money and also looking for places that [employer] charge an Forensic scientist. Patient reported that she has reached out to [employer] and completed a application for section 8 but has not heard anything back. Patient reported that she also contacted [place] housing coalition and was provided a list of places to call for housing. Patient reported that she also uses [employer]com to look for housing and has not been successful.  CSW encouraged patient to continue looking for housing and utilizing her resources. CSW provided patient with housing resources and crisis assistance programs resource to aid in finding funds to assist with deposits and housing application fees.   Employment status:  Retired Nurse, adult PT Recommendations:  Not assessed at this time Information / Referral to community resources:  Shelter(Housing Resources)  Patient/Family's Response to care:  Patient appreciative of resources provided by CSW.  Patient/Family's Understanding of and Emotional Response to Diagnosis, Current Treatment, and Prognosis:  Patient presented calm and reported familiarity of housing resources. Patient verbalized plan to continue seeking housing. Patient hopeful to find housing.    Emotional Assessment Appearance:  Appears stated age Attitude/Demeanor/Rapport:  Other(Cooperative) Affect (typically observed):  Pleasant Orientation:  Oriented to Self, Oriented to Place, Oriented to  Time, Oriented to Situation Alcohol / Substance use:  Not Applicable Psych involvement (Current and /or  in the community):  No (Comment)  Discharge Needs  Concerns to be addressed:  No discharge needs identified Readmission within the last 30 days:  No Current discharge risk:  None Barriers to Discharge:  No Barriers Identified   Burnis Medin, LCSW 04/26/2017, 12:07 PM

## 2017-04-26 NOTE — Progress Notes (Signed)
  Echocardiogram 2D Echocardiogram has been performed.  Crystal Thornton F 04/26/2017, 9:12 AM

## 2017-04-26 NOTE — Progress Notes (Signed)
[facility] rep alerted of order for RW. Sherolyn Trettin RN,BSN,NCM [phone] 

## 2017-04-26 NOTE — Discharge Instructions (Signed)
1)Use walker for ambulation to avoid falling 2)Apply a compressive ACE bandage to Right Knee. Rest and elevate the  Right Knee.  Apply cold compresses intermittently as needed.  As pain recedes, begin normal activities slowly as tolerated.  Call if symptoms persist.  3)Take medications as prescribed 4) if right knee pain does not get better,  follow-up with  primary care doctor for further evaluation and possible repeat imaging and Possible referral to orthopedic clinic 5)Quit Smoking !!!

## 2017-04-26 NOTE — Progress Notes (Signed)
*  PRELIMINARY RESULTS* Vascular Ultrasound Carotid Duplex (Doppler) has been completed.  Preliminary findings: Bilateral 1-39% ICA stenosis, antegrade vertebral flow.  Multiple small thyroid nodules noted bilaterally.   Everrett Coombe 04/26/2017, 9:49 AM

## 2017-04-26 NOTE — Plan of Care (Signed)
  Activity: Risk for activity intolerance will decrease 04/26/2017 1255 - Progressing by Dorene Sorrow, RN   Nutrition: Adequate nutrition will be maintained 04/26/2017 1255 - Progressing by Dorene Sorrow, RN   Safety: Ability to remain free from injury will improve 04/26/2017 1255 - Progressing by Dorene Sorrow, RN

## 2017-04-26 NOTE — Evaluation (Signed)
Physical Therapy Evaluation Patient Details Name: Crystal Thornton MRN: [id number] DOB: 1946-01-05 Today's Date: 04/26/2017   History of Present Illness  72 yo female admitted after having a syncopal episode and sustaining a fall. During fall, pt injured R knee. Hx of Hep C, falls, rib fxs, DM, osteopenia, back pain, Hep B.     Clinical Impression  On eval, pt walked ~50 feet with a RW with Min guard assist. Pain rated 8/10 with activity. R knee warm to touch, swollen, red, and painful. X-rays (-). Pt currently is only able to ambulate with NWB-TDWB gait pattern due to pain. Instructed pt to at least utilize TDWB pattern to aid with keeping balance until pain subsides and she can tolerate more weight on R LE. Instructed pt to continue to ice and elevate area often. She will need a RW for ambulation.  She declined a BSC. Discussed d/c plan-she plans to return home with son and son's girlfriend helping as needed. Will continue to follow and progress activity as tolerated.     Follow Up Recommendations Supervision for mobility/OOB    Equipment Recommendations  Rolling walker with 5" wheels    Recommendations for Other Services       Precautions / Restrictions Precautions Precautions: Fall Required Braces or Orthoses: (R knee sleeve) Restrictions Weight Bearing Restrictions: No      Mobility  Bed Mobility Overal bed mobility: Modified Independent                Transfers Overall transfer level: Needs assistance Equipment used: Rolling walker (2 wheeled) Transfers: Sit to/from Stand Sit to Stand: Min guard         General transfer comment: close guard for safety. VCs safety, technique, R LE placement.   Ambulation/Gait Ambulation/Gait assistance: Min guard Ambulation Distance (Feet): 50 Feet Assistive device: Rolling walker (2 wheeled) Gait Pattern/deviations: Step-to pattern;Antalgic     General Gait Details: Pt switching between NWB patterns vs TDWB patterns during  gait training. VCs safety, sequence, technique. slow, effortful gait speed. Pt c/o mild lightheadedness during ambulation.   Stairs            Wheelchair Mobility    Modified Rankin (Stroke Patients Only)       Balance Overall balance assessment: Needs assistance           Standing balance-Leahy Scale: Poor Standing balance comment: requires RW currently                             Pertinent Vitals/Pain Pain Assessment: 0-10 Pain Score: 8  Pain Location: R knee Pain Descriptors / Indicators: Sore;Aching;Grimacing;Guarding Pain Intervention(s): Limited activity within patient's tolerance;Repositioned    Home Living Family/patient expects to be discharged to:: Private residence Living Arrangements: Children(son's girlfriend. Living with friends)   Type of Home: House Home Access: Level entry     Home Layout: One level Home Equipment: None      Prior Function Level of Independence: Independent               Hand Dominance        Extremity/Trunk Assessment   Upper Extremity Assessment Upper Extremity Assessment: Overall WFL for tasks assessed    Lower Extremity Assessment Lower Extremity Assessment: RLE deficits/detail RLE Deficits / Details: Redness, swelling noted anterior, medial and lateral sides of knee. Warm to touch. Pt unable to tolerate full assessment at this time. SLR 3/5 RLE: Unable to fully assess due to pain  Cervical / Trunk Assessment Cervical / Trunk Assessment: Normal  Communication   Communication: No difficulties  Cognition Arousal/Alertness: Awake/alert Behavior During Therapy: WFL for tasks assessed/performed Overall Cognitive Status: Within Functional Limits for tasks assessed                                        General Comments      Exercises     Assessment/Plan    PT Assessment Patient needs continued PT services  PT Problem List Decreased range of motion;Decreased  mobility;Decreased activity tolerance;Pain;Decreased knowledge of use of DME       PT Treatment Interventions Gait training;Functional mobility training;Therapeutic activities;Balance training;Patient/family education;Therapeutic exercise;DME instruction    PT Goals (Current goals can be found in the Care Plan section)  Acute Rehab PT Goals Patient Stated Goal: less pain PT Goal Formulation: With patient Time For Goal Achievement: 05/10/17 Potential to Achieve Goals: Good    Frequency Min 3X/week   Barriers to discharge        Co-evaluation               AM-PAC PT "6 Clicks" Daily Activity  Outcome Measure Difficulty turning over in bed (including adjusting bedclothes, sheets and blankets)?: A Little Difficulty moving from lying on back to sitting on the side of the bed? : A Little Difficulty sitting down on and standing up from a chair with arms (e.g., wheelchair, bedside commode, etc,.)?: A Little Help needed moving to and from a bed to chair (including a wheelchair)?: A Little Help needed walking in hospital room?: A Little Help needed climbing 3-5 steps with a railing? : A Lot 6 Click Score: 17    End of Session Equipment Utilized During Treatment: Gait belt Activity Tolerance: Patient limited by pain Patient left: in bed;with call bell/phone within reach   PT Visit Diagnosis: Pain;Difficulty in walking, not elsewhere classified (R26.2) Pain - Right/Left: Right Pain - part of body: Knee    Time: 0712-1975 PT Time Calculation (min) (ACUTE ONLY): 25 min   Charges:   PT Evaluation $PT Eval Low Complexity: 1 Low PT Treatments $Gait Training: 8-22 mins   PT G Codes:        Weston Anna, MPT Pager: [phone]

## 2017-04-26 NOTE — Progress Notes (Signed)
04/26/17  1508  Reviewed discharge instructions with patient. Patient verbalized understanding of discharge instructions. Copy of discharge instructions given to patient. Crystal Thornton is at bedside for patient to take home.

## 2017-04-27 ENCOUNTER — Telehealth: Payer: Self-pay | Admitting: *Deleted

## 2017-04-27 NOTE — Telephone Encounter (Signed)
Called pt to make TCM Hosp f/u appt no answer LMOM RTC../lmb 

## 2017-04-30 NOTE — Telephone Encounter (Signed)
Called pt again still no answer LMOM to call back to make hosp f/u appt w/Dr. Jenny Reichmann. Sent CRM to [facility] fro Gregory...Johny Chess

## 2017-05-08 DIAGNOSIS — Z5181 Encounter for therapeutic drug level monitoring: Secondary | ICD-10-CM | POA: Diagnosis not present

## 2017-05-08 DIAGNOSIS — M791 Myalgia, unspecified site: Secondary | ICD-10-CM | POA: Diagnosis not present

## 2017-05-08 DIAGNOSIS — R6889 Other general symptoms and signs: Secondary | ICD-10-CM | POA: Diagnosis not present

## 2017-05-08 DIAGNOSIS — M533 Sacrococcygeal disorders, not elsewhere classified: Secondary | ICD-10-CM | POA: Diagnosis not present

## 2017-05-08 DIAGNOSIS — M792 Neuralgia and neuritis, unspecified: Secondary | ICD-10-CM | POA: Diagnosis not present

## 2017-05-09 ENCOUNTER — Inpatient Hospital Stay: Payer: Self-pay | Admitting: Internal Medicine

## 2017-05-15 DIAGNOSIS — M533 Sacrococcygeal disorders, not elsewhere classified: Secondary | ICD-10-CM | POA: Diagnosis not present

## 2017-05-15 DIAGNOSIS — Z5181 Encounter for therapeutic drug level monitoring: Secondary | ICD-10-CM | POA: Diagnosis not present

## 2017-05-15 DIAGNOSIS — M792 Neuralgia and neuritis, unspecified: Secondary | ICD-10-CM | POA: Diagnosis not present

## 2017-05-15 DIAGNOSIS — M791 Myalgia, unspecified site: Secondary | ICD-10-CM | POA: Diagnosis not present

## 2017-06-04 ENCOUNTER — Other Ambulatory Visit: Payer: Self-pay | Admitting: Internal Medicine

## 2017-06-04 NOTE — Telephone Encounter (Signed)
Done erx 

## 2017-06-12 DIAGNOSIS — M533 Sacrococcygeal disorders, not elsewhere classified: Secondary | ICD-10-CM | POA: Diagnosis not present

## 2017-06-12 DIAGNOSIS — Z5181 Encounter for therapeutic drug level monitoring: Secondary | ICD-10-CM | POA: Diagnosis not present

## 2017-06-12 DIAGNOSIS — M792 Neuralgia and neuritis, unspecified: Secondary | ICD-10-CM | POA: Diagnosis not present

## 2017-06-12 DIAGNOSIS — M791 Myalgia, unspecified site: Secondary | ICD-10-CM | POA: Diagnosis not present

## 2017-06-15 DIAGNOSIS — Z79899 Other long term (current) drug therapy: Secondary | ICD-10-CM | POA: Diagnosis not present

## 2017-06-18 DIAGNOSIS — R6889 Other general symptoms and signs: Secondary | ICD-10-CM | POA: Diagnosis not present

## 2017-06-18 DIAGNOSIS — Z5181 Encounter for therapeutic drug level monitoring: Secondary | ICD-10-CM | POA: Diagnosis not present

## 2017-06-18 DIAGNOSIS — M791 Myalgia, unspecified site: Secondary | ICD-10-CM | POA: Diagnosis not present

## 2017-06-18 DIAGNOSIS — M533 Sacrococcygeal disorders, not elsewhere classified: Secondary | ICD-10-CM | POA: Diagnosis not present

## 2017-06-18 DIAGNOSIS — M792 Neuralgia and neuritis, unspecified: Secondary | ICD-10-CM | POA: Diagnosis not present

## 2017-06-25 ENCOUNTER — Ambulatory Visit: Payer: Self-pay | Admitting: Internal Medicine

## 2017-06-29 ENCOUNTER — Encounter: Payer: Self-pay | Admitting: Internal Medicine

## 2017-06-29 ENCOUNTER — Ambulatory Visit: Payer: Medicare HMO | Admitting: Internal Medicine

## 2017-06-29 ENCOUNTER — Other Ambulatory Visit (INDEPENDENT_AMBULATORY_CARE_PROVIDER_SITE_OTHER): Payer: Self-pay

## 2017-06-29 VITALS — BP 136/86 | HR 72 | Temp 98.0°F | Ht 67.0 in | Wt 134.0 lb

## 2017-06-29 DIAGNOSIS — Z0000 Encounter for general adult medical examination without abnormal findings: Secondary | ICD-10-CM

## 2017-06-29 DIAGNOSIS — Z23 Encounter for immunization: Secondary | ICD-10-CM

## 2017-06-29 DIAGNOSIS — E119 Type 2 diabetes mellitus without complications: Secondary | ICD-10-CM

## 2017-06-29 DIAGNOSIS — R6889 Other general symptoms and signs: Secondary | ICD-10-CM | POA: Diagnosis not present

## 2017-06-29 LAB — CBC WITH DIFFERENTIAL/PLATELET
Basophils Absolute: 0 10*3/uL (ref 0.0–0.1)
Basophils Relative: 0.6 % (ref 0.0–3.0)
EOS ABS: 0.3 10*3/uL (ref 0.0–0.7)
Eosinophils Relative: 3.5 % (ref 0.0–5.0)
HEMATOCRIT: 41.9 % (ref 36.0–46.0)
HEMOGLOBIN: 13.9 g/dL (ref 12.0–15.0)
LYMPHS PCT: 15.3 % (ref 12.0–46.0)
Lymphs Abs: 1.2 10*3/uL (ref 0.7–4.0)
MCHC: 33 g/dL (ref 30.0–36.0)
MCV: 92.5 fl (ref 78.0–100.0)
MONOS PCT: 7.9 % (ref 3.0–12.0)
Monocytes Absolute: 0.6 10*3/uL (ref 0.1–1.0)
NEUTROS ABS: 5.9 10*3/uL (ref 1.4–7.7)
Neutrophils Relative %: 72.7 % (ref 43.0–77.0)
PLATELETS: 223 10*3/uL (ref 150.0–400.0)
RBC: 4.53 Mil/uL (ref 3.87–5.11)
RDW: 13.7 % (ref 11.5–15.5)
WBC: 8.1 10*3/uL (ref 4.0–10.5)

## 2017-06-29 LAB — HEPATIC FUNCTION PANEL
ALK PHOS: 121 U/L — AB (ref 39–117)
ALT: 23 U/L (ref 0–35)
AST: 26 U/L (ref 0–37)
Albumin: 3.9 g/dL (ref 3.5–5.2)
BILIRUBIN DIRECT: 0.1 mg/dL (ref 0.0–0.3)
TOTAL PROTEIN: 7.2 g/dL (ref 6.0–8.3)
Total Bilirubin: 0.3 mg/dL (ref 0.2–1.2)

## 2017-06-29 LAB — MICROALBUMIN / CREATININE URINE RATIO
Creatinine,U: 105.6 mg/dL
MICROALB UR: 13.4 mg/dL — AB (ref 0.0–1.9)
Microalb Creat Ratio: 12.7 mg/g (ref 0.0–30.0)

## 2017-06-29 LAB — URINALYSIS, ROUTINE W REFLEX MICROSCOPIC
BILIRUBIN URINE: NEGATIVE
HGB URINE DIPSTICK: NEGATIVE
Ketones, ur: NEGATIVE
Leukocytes, UA: NEGATIVE
NITRITE: NEGATIVE
RBC / HPF: NONE SEEN (ref 0–?)
Specific Gravity, Urine: 1.025 (ref 1.000–1.030)
Urine Glucose: 250 — AB
Urobilinogen, UA: 0.2 (ref 0.0–1.0)
pH: 6 (ref 5.0–8.0)

## 2017-06-29 LAB — BASIC METABOLIC PANEL
BUN: 15 mg/dL (ref 6–23)
CALCIUM: 9.7 mg/dL (ref 8.4–10.5)
CHLORIDE: 105 meq/L (ref 96–112)
CO2: 29 mEq/L (ref 19–32)
CREATININE: 0.81 mg/dL (ref 0.40–1.20)
GFR: 74.07 mL/min (ref >60.00)
Glucose, Bld: 208 mg/dL — ABNORMAL HIGH (ref 70–99)
Potassium: 4.7 mEq/L (ref 3.5–5.1)
Sodium: 140 mEq/L (ref 135–145)

## 2017-06-29 LAB — LIPID PANEL
CHOL/HDL RATIO: 2
CHOLESTEROL: 112 mg/dL (ref 0–200)
HDL: 49.9 mg/dL (ref >39.00)
LDL CALC: 26 mg/dL (ref 0–99)
NonHDL: 62.02
Triglycerides: 182 mg/dL — ABNORMAL HIGH (ref 0.0–149.0)
VLDL: 36.4 mg/dL (ref 0.0–40.0)

## 2017-06-29 LAB — TSH: TSH: 1.44 u[IU]/mL (ref 0.35–4.50)

## 2017-06-29 LAB — HEMOGLOBIN A1C: HEMOGLOBIN A1C: 7.1 % — AB (ref 4.6–6.5)

## 2017-06-29 NOTE — Patient Instructions (Addendum)
You had the Pneumovax pneumonia shot today  Please schedule the bone density test before leaving today at the scheduling desk (where you check out  Please continue all other medications as before, and refills have been done if requested.  Please have the pharmacy call with any other refills you may need.  Please continue your efforts at being more active, low cholesterol diet, and weight control.  You are otherwise up to date with prevention measures today.  Please keep your appointments with your specialists as you may have planned  Please go to the LAB in the Basement (turn left off the elevator) for the tests to be done today  You will be contacted by phone if any changes need to be made immediately.  Otherwise, you will receive a letter about your results with an explanation, but please check with MyChart first.  Please remember to sign up for MyChart if you have not done so, as this will be important to you in the future with finding out test results, communicating by private email, and scheduling acute appointments online when needed.  Please return in 6 months, or sooner if needed, with Lab testing done 3-5 days before

## 2017-06-29 NOTE — Progress Notes (Addendum)
Subjective:    Patient ID: Crystal Thornton, female    DOB: 09/28/1945, 72 y.o.   MRN: [id number]  HPI  Here for wellness and f/u;  Overall doing ok;  Pt denies Chest pain, worsening SOB, DOE, wheezing, orthopnea, PND, worsening LE edema, palpitations, dizziness or syncope.  Pt denies neurological change such as new headache, facial or extremity weakness.  Pt denies polydipsia, polyuria, or low sugar symptoms. Pt states overall good compliance with treatment and medications, good tolerability, and has been trying to follow appropriate diet.  Pt denies worsening depressive symptoms, suicidal ideation or panic. No fever, night sweats, wt loss, loss of appetite, or other constitutional symptoms.  Pt states good ability with ADL's, has low fall risk, home safety reviewed and adequate, no other significant changes in hearing or vision, and recently not active with exercise. Also has not started trulicity as insurance does not cover, she lost the rx, and then found out the copay at [place] is only $45 compared to over $600.  Her car died and having financial difficulty.  Plans to do labs today, and get dxa, PFT's as she can due to cost.   Also mentions has started with some memory worsening recenlty but "not serious."    Has finally been able to get her tobacco use to only 1-2 per day., plans to quit completely after this pack.   No other new complaints or interval hx Past Medical History:  Diagnosis Date  . ANGIOEDEMA 01/24/2010  . Anxiety   . ANXIETY 11/07/2006  . BACK PAIN 11/01/2007  . Cough 05/29/2008  . CYST, OVARIAN NEC/NOS 11/05/2006  . Depression   . DEPRESSION 11/07/2006  . Diabetes mellitus   . DIABETES MELLITUS, TYPE II 11/07/2006  . DIVERTICULOSIS, COLON 11/07/2006  . FIBROCYSTIC BREAST DISEASE 11/05/2006  . HEAD TRAUMA, CLOSED 01/24/2010  . HEPATITIS B, HX OF 11/05/2006  . Hepatitis C    Treated and no longer has  . HEPATITIS C 11/05/2006  . Hyperlipidemia   . HYPERLIPIDEMIA 11/07/2006  .  Hypertension   . HYPERTENSION 05/29/2007  . INSOMNIA, HX OF 11/05/2006  . LOW BACK PAIN 05/29/2007  . Osteopenia   . OSTEOPENIA 11/07/2006  . Other specified sites of sprains and strains 08/31/2008  . Pain    R. shoulder and lower back and legs  . PULMONARY NODULE, LEFT UPPER LOBE 05/31/2009  . Seizures ([place])   . SHOULDER PAIN, RIGHT, CHRONIC 06/02/2010  . SINUSITIS- ACUTE-NOS 12/10/2007  . SKIN LESION 06/02/2010  . TOTAL ABDOMINAL HYSTERECTOMY, HX OF 03/27/1990  . Wears dentures    full top  . Wears glasses    Past Surgical History:  Procedure Laterality Date  . APPENDECTOMY    . CERVICAL FUSION  2013  . CESAREAN SECTION    . COLONOSCOPY    . EUS N/A 07/29/2015   Procedure: UPPER ENDOSCOPIC ULTRASOUND (EUS) LINEAR;  Surgeon: Milus Banister, MD;  Location: [facility];  Service: Endoscopy;  Laterality: N/A;  . EXTERNAL FIXATION LEG Left 12/12/2012   Procedure: LEFT ADJUSTMENT OF EXTERNAL FIXATION UNDER ANESTHESIA;  Surgeon: Wylene Simmer, MD;  Location: [place];  Service: Orthopedics;  Laterality: Left;  . LIVER BIOPSY    . ORIF ANKLE FRACTURE Left 12/03/2012  . ORIF ANKLE FRACTURE Left 01/02/2013   Procedure: LEFT ANKLE REMOVAL OF EXTERNAL FIXATOR OPEN REDUCTION INTERNAL FIXATION (ORIF) TRIMALLEOLAR FRACTURE;  Surgeon: Wylene Simmer, MD;  Location: [place];  Service: Orthopedics;  Laterality: Left;  .  OVARIAN CYST SURGERY     ruptured  . SHOULDER ARTHROSCOPY  2008   left  . TUBAL LIGATION      reports that she has been smoking cigarettes.  She has a 50.00 pack-year smoking history. She has never used smokeless tobacco. She reports that she does not drink alcohol or use drugs. family history includes Liver cancer in her mother. Allergies  Allergen Reactions  . Naproxen Sodium Anaphylaxis  . Sulfonamide Derivatives Other (See Comments)    Causes nephritis  . Ace Inhibitors Swelling    Lip swelling   Current Outpatient Medications on File Prior to Visit    Medication Sig Dispense Refill  . albuterol (PROVENTIL HFA;VENTOLIN HFA) 108 (90 Base) MCG/ACT inhaler Inhale 2 puffs into the lungs every 6 (six) hours as needed for wheezing or shortness of breath. 1 Inhaler 2  . aspirin EC 325 MG tablet Take 325 mg by mouth daily.    Marland Kitchen atenolol (TENORMIN) 50 MG tablet Take 1 tablet (50 mg total) by mouth daily. 90 tablet 3  . B Complex-C (B-COMPLEX WITH VITAMIN C) tablet Take 1 tablet by mouth daily.    . clonazePAM (KLONOPIN) 1 MG tablet TAKE 1 TABLET BY MOUTH TWICE DAILY AS NEEDED FOR ANXIETY 60 tablet 2  . cyclobenzaprine (FLEXERIL) 10 MG tablet Take 20 mg by mouth 3 (three) times daily as needed for muscle spasms.     . Dulaglutide (TRULICITY) 1.47 WG/9.5AO SOPN Use as directed weekly SQ 12 pen 3  . GINKGO BILOBA COMPLEX PO Take 1 tablet by mouth daily.    . Ginseng 500 MG CAPS Take 1 capsule by mouth daily.    Marland Kitchen HYDROcodone-acetaminophen (NORCO) 5-325 MG tablet Take 1-2 tablets by mouth every 4 (four) hours as needed. 15 tablet 0  . Ivermectin (SKLICE) 0.5 % LOTN Use as directed 234 g 0  . metFORMIN (GLUCOPHAGE) 500 MG tablet TAKE 2 TABLETS BY MOUTH Twice daily 360 tablet 3  . Multiple Vitamins-Minerals (CENTRUM SILVER PO) Take 1 tablet by mouth daily.      Marland Kitchen oxycodone (ROXICODONE) 30 MG immediate release tablet 30 mg 5 times daily as needed for pain. 20 tablet 0  . rosuvastatin (CRESTOR) 40 MG tablet Take 1 tablet (40 mg total) by mouth daily. 90 tablet 3  . sertraline (ZOLOFT) 100 MG tablet Take 2 tablets (200 mg total) by mouth daily. Yearly physical w/labs due in August 180 tablet 3  . solifenacin (VESICARE) 5 MG tablet Take 1 tablet (5 mg total) by mouth daily. 90 tablet 3  . tiotropium (SPIRIVA) 18 MCG inhalation capsule Place 1 capsule (18 mcg total) into inhaler and inhale daily. 30 capsule 5   No current facility-administered medications on file prior to visit.    Review of Systems Constitutional: Negative for other unusual diaphoresis,  sweats, appetite or weight changes HENT: Negative for other worsening hearing loss, ear pain, facial swelling, mouth sores or neck stiffness.   Eyes: Negative for other worsening pain, redness or other visual disturbance.  Respiratory: Negative for other stridor or swelling Cardiovascular: Negative for other palpitations or other chest pain  Gastrointestinal: Negative for worsening diarrhea or loose stools, blood in stool, distention or other pain Genitourinary: Negative for hematuria, flank pain or other change in urine volume.  Musculoskeletal: Negative for myalgias or other joint swelling.  Skin: Negative for other color change, or other wound or worsening drainage.  Neurological: Negative for other syncope or numbness. Hematological: Negative for other adenopathy or swelling Psychiatric/Behavioral:  Negative for hallucinations, other worsening agitation, SI, self-injury, or new decreased concentration\All other system neg per pt    Objective:   Physical Exam BP 136/86   Pulse 72   Temp 98 F (36.7 C) (Oral)   Ht 5\' 7"  (1.702 m)   Wt 134 lb (60.8 kg)   SpO2 98%   BMI 20.99 kg/m  VS noted,  Constitutional: Pt is oriented to person, place, and time. Appears well-developed and well-nourished, in no significant distress and comfortable Head: Normocephalic and atraumatic  Eyes: Conjunctivae and EOM are normal. Pupils are equal, round, and reactive to light Right Ear: External ear normal without discharge Left Ear: External ear normal without discharge Nose: Nose without discharge or deformity Mouth/Throat: Oropharynx is without other ulcerations and moist  Neck: Normal range of motion. Neck supple. No JVD present. No tracheal deviation present or significant neck LA or mass Cardiovascular: Normal rate, regular rhythm, normal heart sounds and intact distal pulses.   Pulmonary/Chest: WOB normal and breath sounds without rales or wheezing  Abdominal: Soft. Bowel sounds are normal. NT. No  HSM  Musculoskeletal: Normal range of motion. Exhibits no LE edema Lymphadenopathy: Has no other cervical adenopathy.  Neurological: Pt is alert and oriented to person, place, and time. Pt has normal reflexes. No cranial nerve deficit. Motor grossly intact, Gait intact Skin: Skin is warm and dry. No rash noted or new ulcerations Psychiatric:  Has normal mood and affect. Behavior is normal without agitation No other exam findings Lab Results  Component Value Date   WBC 8.1 06/29/2017   HGB 13.9 06/29/2017   HCT 41.9 06/29/2017   PLT 223.0 06/29/2017   GLUCOSE 208 (H) 06/29/2017   CHOL 112 06/29/2017   TRIG 182.0 (H) 06/29/2017   HDL 49.90 06/29/2017   LDLDIRECT 103.0 11/17/2015   LDLCALC 26 06/29/2017   ALT 23 06/29/2017   AST 26 06/29/2017   NA 140 06/29/2017   K 4.7 06/29/2017   CL 105 06/29/2017   CREATININE 0.81 06/29/2017   BUN 15 06/29/2017   CO2 29 06/29/2017   TSH 1.44 06/29/2017   HGBA1C 7.1 (H) 06/29/2017   MICROALBUR 13.4 (H) 06/29/2017       Assessment & Plan:

## 2017-06-30 ENCOUNTER — Encounter: Payer: Self-pay | Admitting: Internal Medicine

## 2017-06-30 NOTE — Assessment & Plan Note (Signed)
stable overall by history and exam, recent data reviewed with pt, and pt to continue medical treatment as before,  to f/u any worsening symptoms or concerns Lab Results  Component Value Date   HGBA1C 7.1 (H) 06/29/2017

## 2017-06-30 NOTE — Assessment & Plan Note (Signed)

## 2017-07-16 DIAGNOSIS — M791 Myalgia, unspecified site: Secondary | ICD-10-CM | POA: Diagnosis not present

## 2017-07-16 DIAGNOSIS — Z5181 Encounter for therapeutic drug level monitoring: Secondary | ICD-10-CM | POA: Diagnosis not present

## 2017-07-16 DIAGNOSIS — R6889 Other general symptoms and signs: Secondary | ICD-10-CM | POA: Diagnosis not present

## 2017-07-16 DIAGNOSIS — M533 Sacrococcygeal disorders, not elsewhere classified: Secondary | ICD-10-CM | POA: Diagnosis not present

## 2017-07-16 DIAGNOSIS — M792 Neuralgia and neuritis, unspecified: Secondary | ICD-10-CM | POA: Diagnosis not present

## 2017-08-10 ENCOUNTER — Other Ambulatory Visit: Payer: Self-pay

## 2017-08-10 ENCOUNTER — Emergency Department (HOSPITAL_COMMUNITY)
Admission: EM | Admit: 2017-08-10 | Discharge: 2017-08-10 | Disposition: A | Discharge status: Home / Self Care | Payer: Medicare HMO | Attending: Emergency Medicine | Admitting: Emergency Medicine

## 2017-08-10 ENCOUNTER — Emergency Department (HOSPITAL_COMMUNITY): Payer: Medicare HMO

## 2017-08-10 ENCOUNTER — Encounter (HOSPITAL_COMMUNITY): Payer: Self-pay

## 2017-08-10 DIAGNOSIS — Z7984 Long term (current) use of oral hypoglycemic drugs: Secondary | ICD-10-CM | POA: Diagnosis not present

## 2017-08-10 DIAGNOSIS — J441 Chronic obstructive pulmonary disease with (acute) exacerbation: Secondary | ICD-10-CM | POA: Insufficient documentation

## 2017-08-10 DIAGNOSIS — Z79899 Other long term (current) drug therapy: Secondary | ICD-10-CM | POA: Diagnosis not present

## 2017-08-10 DIAGNOSIS — R062 Wheezing: Secondary | ICD-10-CM | POA: Insufficient documentation

## 2017-08-10 DIAGNOSIS — E119 Type 2 diabetes mellitus without complications: Secondary | ICD-10-CM | POA: Insufficient documentation

## 2017-08-10 DIAGNOSIS — R0602 Shortness of breath: Secondary | ICD-10-CM | POA: Diagnosis not present

## 2017-08-10 DIAGNOSIS — F17210 Nicotine dependence, cigarettes, uncomplicated: Secondary | ICD-10-CM | POA: Diagnosis not present

## 2017-08-10 DIAGNOSIS — I10 Essential (primary) hypertension: Secondary | ICD-10-CM | POA: Diagnosis not present

## 2017-08-10 DIAGNOSIS — R05 Cough: Secondary | ICD-10-CM | POA: Diagnosis not present

## 2017-08-10 LAB — CBC
HCT: 40.4 % (ref 36.0–46.0)
Hemoglobin: 13.6 g/dL (ref 12.0–15.0)
MCH: 31.1 pg (ref 26.0–34.0)
MCHC: 33.7 g/dL (ref 30.0–36.0)
MCV: 92.2 fL (ref 78.0–100.0)
PLATELETS: 221 10*3/uL (ref 150–400)
RBC: 4.38 MIL/uL (ref 3.87–5.11)
RDW: 13.7 % (ref 11.5–15.5)
WBC: 6.1 10*3/uL (ref 4.0–10.5)

## 2017-08-10 LAB — BASIC METABOLIC PANEL
ANION GAP: 11 (ref 5–15)
BUN: 16 mg/dL (ref 6–20)
CO2: 26 mmol/L (ref 22–32)
CREATININE: 0.71 mg/dL (ref 0.44–1.00)
Calcium: 9.3 mg/dL (ref 8.9–10.3)
Chloride: 105 mmol/L (ref 101–111)
Glucose, Bld: 157 mg/dL — ABNORMAL HIGH (ref 65–99)
Potassium: 3.6 mmol/L (ref 3.5–5.1)
SODIUM: 142 mmol/L (ref 135–145)

## 2017-08-10 LAB — TROPONIN I

## 2017-08-10 MED ORDER — IPRATROPIUM BROMIDE HFA 17 MCG/ACT IN AERS: 2.0000 | INHALATION_SPRAY | Freq: Four times a day (QID) | RESPIRATORY_TRACT | 12 refills | Status: DC | PRN

## 2017-08-10 MED ORDER — IPRATROPIUM-ALBUTEROL 0.5-2.5 (3) MG/3ML IN SOLN
3.0000 mL | Freq: Once | RESPIRATORY_TRACT | Status: AC
  Administered 2017-08-10: 3 mL via RESPIRATORY_TRACT
  Filled 2017-08-10: qty 3

## 2017-08-10 MED ORDER — PREDNISONE 20 MG PO TABS
60.0000 mg | ORAL_TABLET | Freq: Once | ORAL | Status: AC
  Administered 2017-08-10: 60 mg via ORAL
  Filled 2017-08-10: qty 3

## 2017-08-10 MED ORDER — ALBUTEROL SULFATE HFA 108 (90 BASE) MCG/ACT IN AERS: 1.0000 | INHALATION_SPRAY | Freq: Four times a day (QID) | RESPIRATORY_TRACT | 0 refills | Status: DC | PRN

## 2017-08-10 MED ORDER — OXYCODONE HCL 5 MG PO TABS
30.0000 mg | ORAL_TABLET | Freq: Once | ORAL | Status: AC
  Administered 2017-08-10: 30 mg via ORAL
  Filled 2017-08-10: qty 6

## 2017-08-10 MED ORDER — ATENOLOL 50 MG PO TABS
50.0000 mg | ORAL_TABLET | Freq: Once | ORAL | Status: AC
  Administered 2017-08-10: 50 mg via ORAL
  Filled 2017-08-10: qty 1

## 2017-08-10 MED ORDER — PREDNISONE 20 MG PO TABS: ORAL_TABLET | ORAL | 0 refills | Status: DC

## 2017-08-10 MED ORDER — ALBUTEROL SULFATE (2.5 MG/3ML) 0.083% IN NEBU
5.0000 mg | INHALATION_SOLUTION | Freq: Once | RESPIRATORY_TRACT | Status: AC
  Administered 2017-08-10: 5 mg via RESPIRATORY_TRACT
  Filled 2017-08-10: qty 6

## 2017-08-10 MED ORDER — AEROCHAMBER PLUS W/MASK MISC: 2 refills | Status: DC

## 2017-08-10 MED ORDER — AZITHROMYCIN 250 MG PO TABS: ORAL_TABLET | ORAL | 0 refills | Status: DC

## 2017-08-10 MED ORDER — METFORMIN HCL 500 MG PO TABS
500.0000 mg | ORAL_TABLET | Freq: Once | ORAL | Status: AC
  Administered 2017-08-10: 500 mg via ORAL
  Filled 2017-08-10: qty 1

## 2017-08-10 NOTE — Discharge Instructions (Addendum)
1.  Take 2 puffs of albuterol and 2 puffs of Atrovent using a spacer every 4 hours for the next 2 days.  You may then start using them as needed every 4-6 hours. 2.  Continue to quit smoking. 3.  See your doctor for recheck as soon as possible. 4.  Return to the emergency department if you have increased difficulty breathing, chest pain or other concerning symptoms.

## 2017-08-10 NOTE — ED Provider Notes (Signed)
[facility] DEPT Provider Note   CSN: [id number] Arrival date & time: 08/10/17  0941     History   Chief Complaint Chief Complaint  Patient presents with  . COPD    HPI Crystal Thornton is a 72 y.o. female.  HPI Patient has a history of COPD.  She has gradually been quitting smoking.  She is down to about a pack every several days to a week.  She reports however she has had increased cough over the past week, this is been productive of sputum.  Reports both of her lower chest areas ache from coughing.  She reports she felt more short of breath over the past week.  She is also developed a lot of wheezing.  No fever.  No significant URI symptoms.  Patient does have inhalers that she has been using and has tried some over-the-counter remedies but symptoms have worsened.  No lower extremity swelling or pain. Past Medical History:  Diagnosis Date  . ANGIOEDEMA 01/24/2010  . Anxiety   . ANXIETY 11/07/2006  . BACK PAIN 11/01/2007  . Cough 05/29/2008  . CYST, OVARIAN NEC/NOS 11/05/2006  . Depression   . DEPRESSION 11/07/2006  . Diabetes mellitus   . DIABETES MELLITUS, TYPE II 11/07/2006  . DIVERTICULOSIS, COLON 11/07/2006  . FIBROCYSTIC BREAST DISEASE 11/05/2006  . HEAD TRAUMA, CLOSED 01/24/2010  . HEPATITIS B, HX OF 11/05/2006  . Hepatitis C    Treated and no longer has  . HEPATITIS C 11/05/2006  . Hyperlipidemia   . HYPERLIPIDEMIA 11/07/2006  . Hypertension   . HYPERTENSION 05/29/2007  . INSOMNIA, HX OF 11/05/2006  . LOW BACK PAIN 05/29/2007  . Osteopenia   . OSTEOPENIA 11/07/2006  . Other specified sites of sprains and strains 08/31/2008  . Pain    R. shoulder and lower back and legs  . PULMONARY NODULE, LEFT UPPER LOBE 05/31/2009  . Seizures ([place])   . SHOULDER PAIN, RIGHT, CHRONIC 06/02/2010  . SINUSITIS- ACUTE-NOS 12/10/2007  . SKIN LESION 06/02/2010  . TOTAL ABDOMINAL HYSTERECTOMY, HX OF 03/27/1990  . Wears dentures    full top  . Wears glasses     Patient  Active Problem List   Diagnosis Date Noted  . Syncope 04/24/2017  . [facility] spotted fever) 12/25/2016  . COPD (chronic obstructive pulmonary disease) ([place]) 11/16/2016  . Rash 11/16/2016  . Choledocholithiasis 06/30/2015  . Urinary urgency 06/24/2015  . Right flank pain 06/24/2015  . RUQ pain 06/24/2015  . Delusions ([place]) 11/03/2014  . Hallucinations 11/03/2014  . Chronic pain syndrome   . Seizure ([place]) 11/01/2014  . Smoker 08/12/2014  . Preventative health care 11/02/2010  . SKIN LESION 06/02/2010  . SHOULDER PAIN, RIGHT, CHRONIC 06/02/2010  . HEAD TRAUMA, CLOSED 01/24/2010  . ANGIOEDEMA 01/24/2010  . Pulmonary nodule, left 05/31/2009  . Other specified sites of sprains and strains 08/31/2008  . Cough 05/29/2008  . BACK PAIN 11/01/2007  . Essential hypertension 05/29/2007  . LOW BACK PAIN 05/29/2007  . Diabetes ([place]) 11/07/2006  . Hyperlipidemia 11/07/2006  . Anxiety state 11/07/2006  . Depression 11/07/2006  . DIVERTICULOSIS, COLON 11/07/2006  . OSTEOPENIA 11/07/2006  . HEPATITIS C 11/05/2006  . FIBROCYSTIC BREAST DISEASE 11/05/2006  . CYST, OVARIAN NEC/NOS 11/05/2006  . HEPATITIS B, HX OF 11/05/2006  . INSOMNIA, HX OF 11/05/2006  . TOTAL ABDOMINAL HYSTERECTOMY, HX OF 03/27/1990    Past Surgical History:  Procedure Laterality Date  . APPENDECTOMY    . CERVICAL FUSION  2013  . CESAREAN SECTION    . COLONOSCOPY    . EUS N/A 07/29/2015   Procedure: UPPER ENDOSCOPIC ULTRASOUND (EUS) LINEAR;  Surgeon: Milus Banister, MD;  Location: [facility];  Service: Endoscopy;  Laterality: N/A;  . EXTERNAL FIXATION LEG Left 12/12/2012   Procedure: LEFT ADJUSTMENT OF EXTERNAL FIXATION UNDER ANESTHESIA;  Surgeon: Wylene Simmer, MD;  Location: [facility];  Service: Orthopedics;  Laterality: Left;  . LIVER BIOPSY    . ORIF ANKLE FRACTURE Left 12/03/2012  . ORIF ANKLE FRACTURE Left 01/02/2013   Procedure: LEFT ANKLE REMOVAL OF EXTERNAL FIXATOR OPEN REDUCTION  INTERNAL FIXATION (ORIF) TRIMALLEOLAR FRACTURE;  Surgeon: Wylene Simmer, MD;  Location: [facility];  Service: Orthopedics;  Laterality: Left;  . OVARIAN CYST SURGERY     ruptured  . SHOULDER ARTHROSCOPY  2008   left  . TUBAL LIGATION       OB History   None      Home Medications    Prior to Admission medications   Medication Sig Start Date End Date Taking? Authorizing Provider  albuterol (PROVENTIL HFA;VENTOLIN HFA) 108 (90 Base) MCG/ACT inhaler Inhale 2 puffs into the lungs every 6 (six) hours as needed for wheezing or shortness of breath. 11/16/16  Yes Biagio Borg, MD  atenolol (TENORMIN) 50 MG tablet Take 1 tablet (50 mg total) by mouth daily. 11/19/16  Yes Biagio Borg, MD  clonazePAM (KLONOPIN) 1 MG tablet TAKE 1 TABLET BY MOUTH TWICE DAILY AS NEEDED FOR ANXIETY 06/04/17  Yes Biagio Borg, MD  HYDROcodone-acetaminophen (NORCO) 5-325 MG tablet Take 1-2 tablets by mouth every 4 (four) hours as needed. 04/15/17  Yes Elnora Morrison, MD  metFORMIN (GLUCOPHAGE) 500 MG tablet TAKE 2 TABLETS BY MOUTH Twice daily 12/25/16  Yes Biagio Borg, MD  Multiple Vitamins-Minerals (CENTRUM SILVER PO) Take 1 tablet by mouth daily.     Yes [provider]  tiotropium (SPIRIVA) 18 MCG inhalation capsule Place 1 capsule (18 mcg total) into inhaler and inhale daily. 12/12/16  Yes Collene Gobble, MD  albuterol (PROVENTIL HFA;VENTOLIN HFA) 108 (90 Base) MCG/ACT inhaler Inhale 1-2 puffs into the lungs every 6 (six) hours as needed for wheezing or shortness of breath. 08/10/17   Charlesetta Shanks, MD  azithromycin (ZITHROMAX Z-PAK) 250 MG tablet 2 po day one, then 1 daily x 4 days 08/10/17   Charlesetta Shanks, MD  Dulaglutide (TRULICITY) 8.54 OE/7.0JJ SOPN Use as directed weekly SQ Patient not taking: Reported on 08/10/2017 12/25/16   Biagio Borg, MD  ipratropium (ATROVENT HFA) 17 MCG/ACT inhaler Inhale 2 puffs into the lungs every 6 (six) hours as needed for wheezing. 08/10/17   Charlesetta Shanks, MD  Ivermectin (SKLICE) 0.5 % LOTN Use as directed Patient not taking: Reported on 08/10/2017 12/25/16   Biagio Borg, MD  oxycodone (ROXICODONE) 30 MG immediate release tablet 30 mg 5 times daily as needed for pain. Patient not taking: Reported on 08/10/2017 11/04/14   Eugenie Filler, MD  predniSONE (DELTASONE) 20 MG tablet 2 tabs po daily x 3 days 08/10/17   Charlesetta Shanks, MD  rosuvastatin (CRESTOR) 40 MG tablet Take 1 tablet (40 mg total) by mouth daily. Patient not taking: Reported on 08/10/2017 11/19/16   Biagio Borg, MD  sertraline (ZOLOFT) 100 MG tablet Take 2 tablets (200 mg total) by mouth daily. Yearly physical w/labs due in August Patient not taking: Reported on 08/10/2017 11/19/16   Biagio Borg, MD  solifenacin (VESICARE) 5 MG tablet Take 1 tablet (5 mg total) by mouth daily. Patient not taking: Reported on 08/10/2017 11/19/16   Biagio Borg, MD  Spacer/Aero-Holding Chambers (AEROCHAMBER PLUS WITH MASK) inhaler Use as instructed 08/10/17   Charlesetta Shanks, MD    Family History Family History  Problem Relation Age of Onset  . Liver cancer Mother     Social History Social History   Tobacco Use  . Smoking status: Current Every Day Smoker    Packs/day: 0.25    Years: 50.00    Pack years: 12.50    Types: Cigarettes  . Smokeless tobacco: Never Used  Substance Use Topics  . Alcohol use: No  . Drug use: No     Allergies   Naproxen sodium; Sulfonamide derivatives; and Ace inhibitors   Review of Systems Review of Systems 10 Systems reviewed and are negative for acute change except as noted in the HPI.   Physical Exam Updated Vital Signs BP (!) 144/107   Pulse 90   Temp 97.9 F (36.6 C) (Oral)   Resp (!) 53   Ht 5\' 8"  (1.727 m)   Wt 58.1 kg (128 lb)   SpO2 90%   BMI 19.46 kg/m   Physical Exam  Constitutional: She is oriented to person, place, and time.  Patient is alert and nontoxic.  No distress at rest.  Intermittent harsh cough.  Speaking in  full sentences without any respiratory distress.  HENT:  Head: Normocephalic and atraumatic.  Posterior oropharynx widely patent.  No erythema or exudate.  Eyes: EOM are normal.  Cardiovascular: Normal rate, regular rhythm, normal heart sounds and intact distal pulses.  Pulmonary/Chest:  Harsh cough.  Coarse expiratory wheeze in upper lung fields.  Fair airflow to the bases.  Scattered rhonchi.  Abdominal: Soft. She exhibits no distension. There is no tenderness. There is no guarding.  Musculoskeletal: Normal range of motion. She exhibits no edema or tenderness.  Neurological: She is alert and oriented to person, place, and time. She exhibits normal muscle tone. Coordination normal.  Skin: Skin is warm and dry.  Psychiatric: She has a normal mood and affect.     ED Treatments / Results  Labs (all labs ordered are listed, but only abnormal results are displayed) Labs Reviewed  BASIC METABOLIC PANEL - Abnormal; Notable for the following components:      Result Value   Glucose, Bld 157 (*)    All other components within normal limits  CBC  TROPONIN I    EKG EKG Interpretation  Date/Time:  Friday Aug 10 2017 10:08:46 EDT Ventricular Rate:  76 PR Interval:    QRS Duration: 111 QT Interval:  430 QTC Calculation: 484 R Axis:   -64 Text Interpretation:  Sinus rhythm Incomplete RBBB and LAFB RVH with secondary repolarization abnrm No significant change since last tracing Confirmed by Lacretia Leigh ([id number]) on 08/10/2017 11:53:14 AM   Radiology Dg Chest 2 View  Result Date: 08/10/2017 CLINICAL DATA:  Cough and short of breath.  History COPD EXAM: CHEST - 2 VIEW COMPARISON:  04/24/2017 FINDINGS: Mild pulmonary hyperinflation. Heart size upper normal. Negative for heart failure or pneumonia. No pleural effusion. Lungs are clear. IMPRESSION: COPD without acute cardiopulmonary abnormality. Electronically Signed   By: Franchot Gallo M.D.   On: 08/10/2017 12:55    Procedures Procedures  (including critical care time)  Medications Ordered in ED Medications  albuterol (PROVENTIL) (2.5 MG/3ML) 0.083% nebulizer solution 5 mg (5 mg Nebulization Given 08/10/17 1014)  ipratropium-albuterol (DUONEB) 0.5-2.5 (3) MG/3ML nebulizer solution 3 mL (3 mLs Nebulization Given 08/10/17 1206)  predniSONE (DELTASONE) tablet 60 mg (60 mg Oral Given 08/10/17 1206)  oxyCODONE (Oxy IR/ROXICODONE) immediate release tablet 30 mg (30 mg Oral Given 08/10/17 1206)  metFORMIN (GLUCOPHAGE) tablet 500 mg (500 mg Oral Given 08/10/17 1206)  atenolol (TENORMIN) tablet 50 mg (50 mg Oral Given 08/10/17 1346)     Initial Impression / Assessment and Plan / ED Course  I have reviewed the triage vital signs and the nursing notes.  Pertinent labs & imaging results that were available during my care of the patient were reviewed by me and considered in my medical decision making (see chart for details).      Final Clinical Impressions(s) / ED Diagnoses   Final diagnoses:  COPD exacerbation ([place])   Patient symptoms are consistent with COPD exacerbation.  Patient has not had fever.  She is nontoxic.  No respiratory distress at rest.  No lower extremity swelling or calf pain.  Cough has been productive but chest x-ray shows no focal pneumonia.  Low suspicion for pulmonary embolus, CHF, coronary artery disease.  She felt much improved after 2 DuoNeb's.  Patient does not have any hypoxia on room air.  At this time plan will be for outpatient management of COPD.  Return precautions reviewed. ED Discharge Orders        Ordered    azithromycin (ZITHROMAX Z-PAK) 250 MG tablet     08/10/17 1415    predniSONE (DELTASONE) 20 MG tablet     08/10/17 1415    albuterol (PROVENTIL HFA;VENTOLIN HFA) 108 (90 Base) MCG/ACT inhaler  Every 6 hours PRN     08/10/17 1415    Spacer/Aero-Holding Chambers (AEROCHAMBER PLUS WITH MASK) inhaler     08/10/17 1415    ipratropium (ATROVENT HFA) 17 MCG/ACT inhaler  Every 6 hours PRN      08/10/17 1415       Charlesetta Shanks, MD 08/10/17 1430

## 2017-08-10 NOTE — ED Triage Notes (Signed)
Patient c/o increased SOB x 1 week. Patient reports a history of COPD. Patient continues to smoke, but states "very little." Patient has expiratory wheezing.

## 2017-08-13 DIAGNOSIS — Z5181 Encounter for therapeutic drug level monitoring: Secondary | ICD-10-CM | POA: Diagnosis not present

## 2017-08-13 DIAGNOSIS — M533 Sacrococcygeal disorders, not elsewhere classified: Secondary | ICD-10-CM | POA: Diagnosis not present

## 2017-08-13 DIAGNOSIS — M791 Myalgia, unspecified site: Secondary | ICD-10-CM | POA: Diagnosis not present

## 2017-08-13 DIAGNOSIS — M792 Neuralgia and neuritis, unspecified: Secondary | ICD-10-CM | POA: Diagnosis not present

## 2017-08-17 ENCOUNTER — Encounter: Payer: Self-pay | Admitting: Emergency Medicine

## 2017-09-03 ENCOUNTER — Ambulatory Visit: Payer: Medicare HMO | Admitting: Emergency Medicine

## 2017-09-03 ENCOUNTER — Encounter: Payer: Self-pay | Admitting: Emergency Medicine

## 2017-09-03 DIAGNOSIS — F17200 Nicotine dependence, unspecified, uncomplicated: Secondary | ICD-10-CM

## 2017-09-03 DIAGNOSIS — J449 Chronic obstructive pulmonary disease, unspecified: Secondary | ICD-10-CM | POA: Diagnosis not present

## 2017-09-03 NOTE — Assessment & Plan Note (Signed)
With an acute exacerbation in May.  She was treated with prednisone and steroids and improved.  She is also decreased her cigarettes which I believe has been helpful.  We will continue Spiriva at this time.  I like to get pulmonary function testing at her next visit.  She agrees.   Continue Spiriva 1 inhalation daily. Keep albuterol available to use 2 puffs if needed shortness of breath, chest tightness, wheezing. We will plan to perform pulmonary function testing at your next office visit. Follow with Dr Lamonte Sakai in 3 months with full PFT, or sooner if you have any problems.

## 2017-09-03 NOTE — Progress Notes (Signed)
Subjective:    Patient ID: Crystal Thornton, female    DOB: Jun 09, 1945, 72 y.o.   MRN: [id number]  COPD  She complains of cough and wheezing. There is no shortness of breath. Pertinent negatives include no ear pain, fever, headaches, postnasal drip, rhinorrhea, sneezing, sore throat or trouble swallowing. Her past medical history is significant for COPD.   72 year old active smoker (50 pack years), with a history of treated hepatitis C, hypertension, DM, depression, seizure disorder. Probable COPD. She is referred today for COPD management following a recent AE that was characterized by freq cough, colored sputum, wheeze, some dyspnea. Was treated with pred, albuterol, abx. She improved, is now close to baseline. She still has the albuterol - it helps her breathing when she uses it.   ROV 09/03/17 --follow-up visit for history of tobacco use and COPD.  She never had a pulmonary function testing done since her last visit.  She was in ED in January for syncope and then in mid May for a COPD exacerbation. Treated with azithro and pred.  Her current medications include Spiriva qd, albuterol uses about bid. She reports that she has cut down her cigarettes significantly since the ED visit.  Pneumococcal and Prevnar are both up-to-date.    Review of Systems  Constitutional: Negative for fever and unexpected weight change.  HENT: Negative for congestion, dental problem, ear pain, nosebleeds, postnasal drip, rhinorrhea, sinus pressure, sneezing, sore throat and trouble swallowing.   Eyes: Negative for redness and itching.  Respiratory: Positive for cough and wheezing. Negative for chest tightness and shortness of breath.   Cardiovascular: Negative for palpitations and leg swelling.  Gastrointestinal: Negative for nausea and vomiting.  Genitourinary: Negative for dysuria.  Musculoskeletal: Negative for joint swelling.  Skin: Negative for rash.  Neurological: Negative for headaches.  Hematological: Does  not bruise/bleed easily.  Psychiatric/Behavioral: Negative for dysphoric mood. The patient is not nervous/anxious.     Past Medical History:  Diagnosis Date  . ANGIOEDEMA 01/24/2010  . Anxiety   . ANXIETY 11/07/2006  . BACK PAIN 11/01/2007  . Cough 05/29/2008  . CYST, OVARIAN NEC/NOS 11/05/2006  . Depression   . DEPRESSION 11/07/2006  . Diabetes mellitus   . DIABETES MELLITUS, TYPE II 11/07/2006  . DIVERTICULOSIS, COLON 11/07/2006  . FIBROCYSTIC BREAST DISEASE 11/05/2006  . HEAD TRAUMA, CLOSED 01/24/2010  . HEPATITIS B, HX OF 11/05/2006  . Hepatitis C    Treated and no longer has  . HEPATITIS C 11/05/2006  . Hyperlipidemia   . HYPERLIPIDEMIA 11/07/2006  . Hypertension   . HYPERTENSION 05/29/2007  . INSOMNIA, HX OF 11/05/2006  . LOW BACK PAIN 05/29/2007  . Osteopenia   . OSTEOPENIA 11/07/2006  . Other specified sites of sprains and strains 08/31/2008  . Pain    R. shoulder and lower back and legs  . PULMONARY NODULE, LEFT UPPER LOBE 05/31/2009  . Seizures ([place])   . SHOULDER PAIN, RIGHT, CHRONIC 06/02/2010  . SINUSITIS- ACUTE-NOS 12/10/2007  . SKIN LESION 06/02/2010  . TOTAL ABDOMINAL HYSTERECTOMY, HX OF 03/27/1990  . Wears dentures    full top  . Wears glasses      Family History  Problem Relation Age of Onset  . Liver cancer Mother      Social History   Socioeconomic History  . Marital status: Divorced    Spouse name: Not on file  . Number of children: Not on file  . Years of education: Not on file  . Highest education level: Not  on file  Occupational History  . Not on file  Social Needs  . Financial resource strain: Not on file  . Food insecurity:    Worry: Not on file    Inability: Not on file  . Transportation needs:    Medical: Not on file    Non-medical: Not on file  Tobacco Use  . Smoking status: Current Every Day Smoker    Packs/day: 0.25    Years: 50.00    Pack years: 12.50    Types: Cigarettes  . Smokeless tobacco: Never Used  Substance and Sexual Activity  .  Alcohol use: No  . Drug use: No  . Sexual activity: Not on file    Comment: trying to cut down  Lifestyle  . Physical activity:    Days per week: Not on file    Minutes per session: Not on file  . Stress: Not on file  Relationships  . Social connections:    Talks on phone: Not on file    Gets together: Not on file    Attends religious service: Not on file    Active member of club or organization: Not on file    Attends meetings of clubs or organizations: Not on file    Relationship status: Not on file  . Intimate partner violence:    Fear of current or ex partner: Not on file    Emotionally abused: Not on file    Physically abused: Not on file    Forced sexual activity: Not on file  Other Topics Concern  . Not on file  Social History Narrative  . Not on file  She worked as a Radio broadcast assistant, office job Lived in [place], [place], [place]  Allergies  Allergen Reactions  . Naproxen Sodium Anaphylaxis  . Sulfonamide Derivatives Other (See Comments)    Causes nephritis  . Ace Inhibitors Swelling    Lip swelling     Outpatient Medications Prior to Visit  Medication Sig Dispense Refill  . albuterol (PROVENTIL HFA;VENTOLIN HFA) 108 (90 Base) MCG/ACT inhaler Inhale 2 puffs into the lungs every 6 (six) hours as needed for wheezing or shortness of breath. 1 Inhaler 2  . atenolol (TENORMIN) 50 MG tablet Take 1 tablet (50 mg total) by mouth daily. 90 tablet 3  . clonazePAM (KLONOPIN) 1 MG tablet TAKE 1 TABLET BY MOUTH TWICE DAILY AS NEEDED FOR ANXIETY 60 tablet 2  . HYDROcodone-acetaminophen (NORCO) 5-325 MG tablet Take 1-2 tablets by mouth every 4 (four) hours as needed. 15 tablet 0  . ipratropium (ATROVENT HFA) 17 MCG/ACT inhaler Inhale 2 puffs into the lungs every 6 (six) hours as needed for wheezing. 1 Inhaler 12  . metFORMIN (GLUCOPHAGE) 500 MG tablet TAKE 2 TABLETS BY MOUTH Twice daily 360 tablet 3  . Multiple Vitamins-Minerals (CENTRUM SILVER PO) Take 1 tablet by mouth daily.      Marland Kitchen oxycodone  (ROXICODONE) 30 MG immediate release tablet 30 mg 5 times daily as needed for pain. 20 tablet 0  . Spacer/Aero-Holding Chambers (AEROCHAMBER PLUS WITH MASK) inhaler Use as instructed 1 each 2  . tiotropium (SPIRIVA) 18 MCG inhalation capsule Place 1 capsule (18 mcg total) into inhaler and inhale daily. 30 capsule 5  . albuterol (PROVENTIL HFA;VENTOLIN HFA) 108 (90 Base) MCG/ACT inhaler Inhale 1-2 puffs into the lungs every 6 (six) hours as needed for wheezing or shortness of breath. 1 Inhaler 0  . azithromycin (ZITHROMAX Z-PAK) 250 MG tablet 2 po day one, then 1 daily x 4 days  5 tablet 0  . Dulaglutide (TRULICITY) 2.80 KL/4.9ZP SOPN Use as directed weekly SQ (Patient not taking: Reported on 08/10/2017) 12 pen 3  . Ivermectin (SKLICE) 0.5 % LOTN Use as directed (Patient not taking: Reported on 08/10/2017) 234 g 0  . predniSONE (DELTASONE) 20 MG tablet 2 tabs po daily x 3 days 6 tablet 0  . rosuvastatin (CRESTOR) 40 MG tablet Take 1 tablet (40 mg total) by mouth daily. (Patient not taking: Reported on 08/10/2017) 90 tablet 3  . sertraline (ZOLOFT) 100 MG tablet Take 2 tablets (200 mg total) by mouth daily. Yearly physical w/labs due in August (Patient not taking: Reported on 08/10/2017) 180 tablet 3  . solifenacin (VESICARE) 5 MG tablet Take 1 tablet (5 mg total) by mouth daily. (Patient not taking: Reported on 08/10/2017) 90 tablet 3   No facility-administered medications prior to visit.         Objective:   Physical Exam  Vitals:   09/03/17 1335 09/03/17 1336  BP:  140/80  Pulse:  62  SpO2:  97%  Weight: 134 lb (60.8 kg)   Height: 5\' 7"  (1.702 m)    Gen: Pleasant, Thin woman, in no distress,  normal affect  ENT: No lesions,  mouth clear,  oropharynx clear, no postnasal drip, somewhat hoarse voice  Neck: No JVD, no stridor  Lungs: No use of accessory muscles, distant, some scattered inspiratory crackles, no wheezing  Cardiovascular: RRR, heart sounds normal, no murmur or gallops, no  peripheral edema  Musculoskeletal: No deformities, no cyanosis or clubbing  Neuro: alert, non focal  Skin: Warm, no lesions or rashes     Assessment & Plan:  COPD (chronic obstructive pulmonary disease) ([place]) With an acute exacerbation in May.  She was treated with prednisone and steroids and improved.  She is also decreased her cigarettes which I believe has been helpful.  We will continue Spiriva at this time.  I like to get pulmonary function testing at her next visit.  She agrees.   Continue Spiriva 1 inhalation daily. Keep albuterol available to use 2 puffs if needed shortness of breath, chest tightness, wheezing. We will plan to perform pulmonary function testing at your next office visit. Follow with Dr Lamonte Sakai in 3 months with full PFT, or sooner if you have any problems.  Smoker She has cut down significantly since her recent exacerbation.  We talked about strategies to try and decrease further so that we need to a point where she will be ready to set a quit date.  Congratulations on decreasing your cigarettes.  Keep up the good work.  Our goal will be to stop altogether in the future.  Baltazar Apo, MD, PhD 09/03/2017, 2:03 PM [facility] [phone] or if no answer [phone]

## 2017-09-03 NOTE — Patient Instructions (Signed)
Congratulations on decreasing your cigarettes.  Keep up the good work.  Our goal will be to stop altogether in the future. Continue Spiriva 1 inhalation daily. Keep albuterol available to use 2 puffs if needed shortness of breath, chest tightness, wheezing. We will plan to perform pulmonary function testing at your next office visit. Follow with Dr Lamonte Sakai in 3 months with full PFT, or sooner if you have any problems.

## 2017-09-03 NOTE — Assessment & Plan Note (Signed)
She has cut down significantly since her recent exacerbation.  We talked about strategies to try and decrease further so that we need to a point where she will be ready to set a quit date.  Congratulations on decreasing your cigarettes.  Keep up the good work.  Our goal will be to stop altogether in the future.

## 2017-09-10 DIAGNOSIS — M792 Neuralgia and neuritis, unspecified: Secondary | ICD-10-CM | POA: Diagnosis not present

## 2017-09-10 DIAGNOSIS — M533 Sacrococcygeal disorders, not elsewhere classified: Secondary | ICD-10-CM | POA: Diagnosis not present

## 2017-09-10 DIAGNOSIS — Z5181 Encounter for therapeutic drug level monitoring: Secondary | ICD-10-CM | POA: Diagnosis not present

## 2017-09-10 DIAGNOSIS — M791 Myalgia, unspecified site: Secondary | ICD-10-CM | POA: Diagnosis not present

## 2017-09-12 ENCOUNTER — Other Ambulatory Visit: Payer: Self-pay | Admitting: Internal Medicine

## 2017-09-12 NOTE — Telephone Encounter (Signed)
Done erx 

## 2017-09-19 ENCOUNTER — Other Ambulatory Visit: Payer: Self-pay | Admitting: *Deleted

## 2017-09-19 DIAGNOSIS — I10 Essential (primary) hypertension: Secondary | ICD-10-CM

## 2017-09-19 DIAGNOSIS — E785 Hyperlipidemia, unspecified: Secondary | ICD-10-CM

## 2017-09-19 DIAGNOSIS — E119 Type 2 diabetes mellitus without complications: Secondary | ICD-10-CM

## 2017-09-19 DIAGNOSIS — F32A Depression, unspecified: Secondary | ICD-10-CM

## 2017-09-19 DIAGNOSIS — F329 Major depressive disorder, single episode, unspecified: Secondary | ICD-10-CM

## 2017-09-19 MED ORDER — SERTRALINE HCL 100 MG PO TABS: 200.0000 mg | ORAL_TABLET | Freq: Every day | ORAL | 3 refills | Status: DC

## 2017-09-19 MED ORDER — METFORMIN HCL 500 MG PO TABS: ORAL_TABLET | ORAL | 3 refills | Status: DC

## 2017-09-19 MED ORDER — ATENOLOL 50 MG PO TABS: 50.0000 mg | ORAL_TABLET | Freq: Every day | ORAL | 3 refills | Status: DC

## 2017-09-19 MED ORDER — ROSUVASTATIN CALCIUM 40 MG PO TABS: 40.0000 mg | ORAL_TABLET | Freq: Every day | ORAL | 3 refills | Status: DC

## 2017-09-19 NOTE — Addendum Note (Signed)
Addended by: Cresenciano Lick on: 09/19/2017 04:23 PM   Modules accepted: Orders

## 2017-10-01 ENCOUNTER — Other Ambulatory Visit: Payer: Self-pay

## 2017-10-01 DIAGNOSIS — E785 Hyperlipidemia, unspecified: Secondary | ICD-10-CM

## 2017-10-01 DIAGNOSIS — I10 Essential (primary) hypertension: Secondary | ICD-10-CM

## 2017-10-01 DIAGNOSIS — E119 Type 2 diabetes mellitus without complications: Secondary | ICD-10-CM

## 2017-10-01 MED ORDER — METFORMIN HCL 500 MG PO TABS: ORAL_TABLET | ORAL | 3 refills | Status: DC

## 2017-10-01 MED ORDER — ROSUVASTATIN CALCIUM 40 MG PO TABS: 40.0000 mg | ORAL_TABLET | Freq: Every day | ORAL | 3 refills | Status: DC

## 2017-10-01 MED ORDER — ATENOLOL 50 MG PO TABS: 50.0000 mg | ORAL_TABLET | Freq: Every day | ORAL | 3 refills | Status: DC

## 2017-10-08 DIAGNOSIS — M792 Neuralgia and neuritis, unspecified: Secondary | ICD-10-CM | POA: Diagnosis not present

## 2017-10-08 DIAGNOSIS — Z5181 Encounter for therapeutic drug level monitoring: Secondary | ICD-10-CM | POA: Diagnosis not present

## 2017-10-08 DIAGNOSIS — M533 Sacrococcygeal disorders, not elsewhere classified: Secondary | ICD-10-CM | POA: Diagnosis not present

## 2017-10-08 DIAGNOSIS — M791 Myalgia, unspecified site: Secondary | ICD-10-CM | POA: Diagnosis not present

## 2017-11-05 DIAGNOSIS — M791 Myalgia, unspecified site: Secondary | ICD-10-CM | POA: Diagnosis not present

## 2017-11-05 DIAGNOSIS — M792 Neuralgia and neuritis, unspecified: Secondary | ICD-10-CM | POA: Diagnosis not present

## 2017-11-05 DIAGNOSIS — M533 Sacrococcygeal disorders, not elsewhere classified: Secondary | ICD-10-CM | POA: Diagnosis not present

## 2017-11-05 DIAGNOSIS — Z5181 Encounter for therapeutic drug level monitoring: Secondary | ICD-10-CM | POA: Diagnosis not present

## 2017-12-03 DIAGNOSIS — M533 Sacrococcygeal disorders, not elsewhere classified: Secondary | ICD-10-CM | POA: Diagnosis not present

## 2017-12-03 DIAGNOSIS — M792 Neuralgia and neuritis, unspecified: Secondary | ICD-10-CM | POA: Diagnosis not present

## 2017-12-03 DIAGNOSIS — Z5181 Encounter for therapeutic drug level monitoring: Secondary | ICD-10-CM | POA: Diagnosis not present

## 2017-12-03 DIAGNOSIS — M791 Myalgia, unspecified site: Secondary | ICD-10-CM | POA: Diagnosis not present

## 2017-12-04 ENCOUNTER — Ambulatory Visit: Payer: Medicare HMO | Admitting: Emergency Medicine

## 2017-12-04 ENCOUNTER — Ambulatory Visit (INDEPENDENT_AMBULATORY_CARE_PROVIDER_SITE_OTHER): Payer: Medicare HMO | Admitting: Emergency Medicine

## 2017-12-04 ENCOUNTER — Ambulatory Visit (INDEPENDENT_AMBULATORY_CARE_PROVIDER_SITE_OTHER)
Admission: RE | Admit: 2017-12-04 | Discharge: 2017-12-04 | Disposition: A | Discharge status: Home / Self Care | Payer: Medicare HMO | Source: Ambulatory Visit | Attending: Internal Medicine | Admitting: Internal Medicine

## 2017-12-04 ENCOUNTER — Encounter: Payer: Self-pay | Admitting: Emergency Medicine

## 2017-12-04 DIAGNOSIS — Z23 Encounter for immunization: Secondary | ICD-10-CM | POA: Diagnosis not present

## 2017-12-04 DIAGNOSIS — F17200 Nicotine dependence, unspecified, uncomplicated: Secondary | ICD-10-CM

## 2017-12-04 DIAGNOSIS — E2839 Other primary ovarian failure: Secondary | ICD-10-CM

## 2017-12-04 DIAGNOSIS — J449 Chronic obstructive pulmonary disease, unspecified: Secondary | ICD-10-CM | POA: Diagnosis not present

## 2017-12-04 LAB — PULMONARY FUNCTION TEST
DL/VA % PRED: 68 %
DL/VA: 3.43 ml/min/mmHg/L
DLCO UNC % PRED: 52 %
DLCO UNC: 14.2 ml/min/mmHg
FEF 25-75 POST: 0.84 L/s
FEF 25-75 PRE: 0.5 L/s
FEF2575-%Change-Post: 66 %
FEF2575-%PRED-PRE: 25 %
FEF2575-%Pred-Post: 43 %
FEV1-%Change-Post: 19 %
FEV1-%PRED-POST: 52 %
FEV1-%Pred-Pre: 43 %
FEV1-Post: 1.26 L
FEV1-Pre: 1.06 L
FEV1FVC-%CHANGE-POST: 0 %
FEV1FVC-%Pred-Pre: 74 %
FEV6-%Change-Post: 18 %
FEV6-%PRED-POST: 71 %
FEV6-%Pred-Pre: 60 %
FEV6-PRE: 1.84 L
FEV6-Post: 2.18 L
FEV6FVC-%CHANGE-POST: 0 %
FEV6FVC-%PRED-POST: 102 %
FEV6FVC-%Pred-Pre: 103 %
FVC-%Change-Post: 19 %
FVC-%Pred-Post: 69 %
FVC-%Pred-Pre: 58 %
FVC-Post: 2.21 L
FVC-Pre: 1.86 L
POST FEV1/FVC RATIO: 57 %
POST FEV6/FVC RATIO: 98 %
Pre FEV1/FVC ratio: 57 %
Pre FEV6/FVC Ratio: 99 %
RV % pred: 170 %
RV: 3.96 L
TLC % PRED: 109 %
TLC: 5.87 L

## 2017-12-04 MED ORDER — FLUTICASONE-UMECLIDIN-VILANT 100-62.5-25 MCG/INH IN AEPB: 1.0000 | INHALATION_SPRAY | Freq: Every day | RESPIRATORY_TRACT | 0 refills | Status: DC

## 2017-12-04 NOTE — Patient Instructions (Addendum)
We will try starting Trelegy 1 inhalation daily.  Remember to rinse and gargle after using this medication.  If you benefit from the new medicine then let us know and we will order it through your pharmacy so that we can continue. Keep albuterol available to use 2 puffs up to every 4 hours if needed for shortness of breath, chest tightness, wheezing. Congratulations on cutting down her cigarettes.  You need to set a new goal to decrease even further.  Once we have decreased we may be able to set a quit date. Flu shot this Fall.  Follow with Dr Lamonte Sakai in 4 months or sooner if you have any problems.

## 2017-12-04 NOTE — Progress Notes (Signed)
Patient completed full PFT today. 

## 2017-12-04 NOTE — Assessment & Plan Note (Signed)
We will try starting Trelegy 1 inhalation daily.  Remember to rinse and gargle after using this medication.  If you benefit from the new medicine then let us know and we will order it through your pharmacy so that we can continue. Keep albuterol available to use 2 puffs up to every 4 hours if needed for shortness of breath, chest tightness, wheezing. Congratulations on cutting down her cigarettes.  You need to set a new goal to decrease even further.  Once we have decreased we may be able to set a quit date. Flu shot this Fall.  Follow with Dr Lamonte Sakai in 4 months or sooner if you have any problems.

## 2017-12-04 NOTE — Assessment & Plan Note (Signed)
Discussed strategies for cutting down with her today.

## 2017-12-04 NOTE — Progress Notes (Signed)
Subjective:    Patient ID: Crystal Thornton, female    DOB: 1945/06/28, 72 y.o.   MRN: [id number]  COPD  She complains of cough and wheezing. There is no shortness of breath. Pertinent negatives include no ear pain, fever, headaches, postnasal drip, rhinorrhea, sneezing, sore throat or trouble swallowing. Her past medical history is significant for COPD.   72 year old active smoker (50 pack years), with a history of treated hepatitis C, hypertension, DM, depression, seizure disorder. Probable COPD. She is referred today for COPD management following a recent AE that was characterized by freq cough, colored sputum, wheeze, some dyspnea. Was treated with pred, albuterol, abx. She improved, is now close to baseline. She still has the albuterol - it helps her breathing when she uses it.   ROV 09/03/17 --follow-up visit for history of tobacco use and COPD.  She never had a pulmonary function testing done since her last visit.  She was in ED in January for syncope and then in mid May for a COPD exacerbation. Treated with azithro and pred.  Her current medications include Spiriva qd, albuterol uses about bid. She reports that she has cut down her cigarettes significantly since the ED visit.  Pneumococcal and Prevnar are both up-to-date.  ROV 12/04/17 --Ms. Fahy is a 72 year old smoker with hypertension, diabetes, hep C, seizures.  I have been following her for COPD.  She underwent pulmonary function testing today which I have available for review.  This shows very severe obstruction with a positive bronchodilator response, hyperinflated lung volumes, decreased diffusion capacity that does not fully correct when adjusted for alveolar volume.  FEV1 1.06 L (43% predicted). She has cut down to 5 cig daily. She is currently on Spiriva (ran out 2 weeks ago), uses albuterol about . Her breathing is stable, has occasional wheeze. She has a daily cough prod of thick tan.      Review of Systems  Constitutional:  Negative for fever and unexpected weight change.  HENT: Negative for congestion, dental problem, ear pain, nosebleeds, postnasal drip, rhinorrhea, sinus pressure, sneezing, sore throat and trouble swallowing.   Eyes: Negative for redness and itching.  Respiratory: Positive for cough and wheezing. Negative for chest tightness and shortness of breath.   Cardiovascular: Negative for palpitations and leg swelling.  Gastrointestinal: Negative for nausea and vomiting.  Genitourinary: Negative for dysuria.  Musculoskeletal: Negative for joint swelling.  Skin: Negative for rash.  Neurological: Negative for headaches.  Hematological: Does not bruise/bleed easily.  Psychiatric/Behavioral: Negative for dysphoric mood. The patient is not nervous/anxious.     Past Medical History:  Diagnosis Date  . ANGIOEDEMA 01/24/2010  . Anxiety   . ANXIETY 11/07/2006  . BACK PAIN 11/01/2007  . Cough 05/29/2008  . CYST, OVARIAN NEC/NOS 11/05/2006  . Depression   . DEPRESSION 11/07/2006  . Diabetes mellitus   . DIABETES MELLITUS, TYPE II 11/07/2006  . DIVERTICULOSIS, COLON 11/07/2006  . FIBROCYSTIC BREAST DISEASE 11/05/2006  . HEAD TRAUMA, CLOSED 01/24/2010  . HEPATITIS B, HX OF 11/05/2006  . Hepatitis C    Treated and no longer has  . HEPATITIS C 11/05/2006  . Hyperlipidemia   . HYPERLIPIDEMIA 11/07/2006  . Hypertension   . HYPERTENSION 05/29/2007  . INSOMNIA, HX OF 11/05/2006  . LOW BACK PAIN 05/29/2007  . Osteopenia   . OSTEOPENIA 11/07/2006  . Other specified sites of sprains and strains 08/31/2008  . Pain    R. shoulder and lower back and legs  . PULMONARY NODULE, LEFT UPPER  LOBE 05/31/2009  . Seizures ([place])   . SHOULDER PAIN, RIGHT, CHRONIC 06/02/2010  . SINUSITIS- ACUTE-NOS 12/10/2007  . SKIN LESION 06/02/2010  . TOTAL ABDOMINAL HYSTERECTOMY, HX OF 03/27/1990  . Wears dentures    full top  . Wears glasses      Family History  Problem Relation Age of Onset  . Liver cancer Mother      Social History    Socioeconomic History  . Marital status: Divorced    Spouse name: Not on file  . Number of children: Not on file  . Years of education: Not on file  . Highest education level: Not on file  Occupational History  . Not on file  Social Needs  . Financial resource strain: Not on file  . Food insecurity:    Worry: Not on file    Inability: Not on file  . Transportation needs:    Medical: Not on file    Non-medical: Not on file  Tobacco Use  . Smoking status: Current Every Day Smoker    Packs/day: 0.25    Years: 50.00    Pack years: 12.50    Types: Cigarettes  . Smokeless tobacco: Never Used  Substance and Sexual Activity  . Alcohol use: No  . Drug use: No  . Sexual activity: Not on file    Comment: trying to cut down  Lifestyle  . Physical activity:    Days per week: Not on file    Minutes per session: Not on file  . Stress: Not on file  Relationships  . Social connections:    Talks on phone: Not on file    Gets together: Not on file    Attends religious service: Not on file    Active member of club or organization: Not on file    Attends meetings of clubs or organizations: Not on file    Relationship status: Not on file  . Intimate partner violence:    Fear of current or ex partner: Not on file    Emotionally abused: Not on file    Physically abused: Not on file    Forced sexual activity: Not on file  Other Topics Concern  . Not on file  Social History Narrative  . Not on file  She worked as a Radio broadcast assistant, office job Lived in [place], [place]  Allergies  Allergen Reactions  . Naproxen Sodium Anaphylaxis  . Sulfonamide Derivatives Other (See Comments)    Causes nephritis  . Ace Inhibitors Swelling    Lip swelling     Outpatient Medications Prior to Visit  Medication Sig Dispense Refill  . albuterol (PROVENTIL HFA;VENTOLIN HFA) 108 (90 Base) MCG/ACT inhaler Inhale 2 puffs into the lungs every 6 (six) hours as needed for wheezing or shortness of breath. 1 Inhaler  2  . atenolol (TENORMIN) 50 MG tablet Take 1 tablet (50 mg total) by mouth daily. 90 tablet 3  . Ginseng 100 MG CAPS Take 100 mg by mouth daily.    Marland Kitchen ipratropium (ATROVENT HFA) 17 MCG/ACT inhaler Inhale 2 puffs into the lungs every 6 (six) hours as needed for wheezing. 1 Inhaler 12  . metFORMIN (GLUCOPHAGE) 500 MG tablet TAKE 2 TABLETS BY MOUTH Twice daily 360 tablet 3  . Misc Natural Products (GINKOGIN PO) Take by mouth.    . Multiple Vitamins-Minerals (CENTRUM SILVER PO) Take 1 tablet by mouth daily.      Marland Kitchen oxycodone (ROXICODONE) 30 MG immediate release tablet 30 mg 5 times daily as  needed for pain. 20 tablet 0  . rosuvastatin (CRESTOR) 40 MG tablet Take 1 tablet (40 mg total) by mouth daily. 90 tablet 3  . sertraline (ZOLOFT) 100 MG tablet Take 2 tablets (200 mg total) by mouth daily. 180 tablet 3  . Spacer/Aero-Holding Chambers (AEROCHAMBER PLUS WITH MASK) inhaler Use as instructed 1 each 2  . tiotropium (SPIRIVA) 18 MCG inhalation capsule Place 1 capsule (18 mcg total) into inhaler and inhale daily. 30 capsule 5  . clonazePAM (KLONOPIN) 1 MG tablet TAKE 1 TABLET BY MOUTH TWICE DAILY AS NEEDED FOR ANXIETY 60 tablet 2  . HYDROcodone-acetaminophen (NORCO) 5-325 MG tablet Take 1-2 tablets by mouth every 4 (four) hours as needed. 15 tablet 0   No facility-administered medications prior to visit.         Objective:   Physical Exam  Vitals:   12/04/17 1449  BP: 140/80  Pulse: 61  SpO2: 95%  Weight: 135 lb (61.2 kg)  Height: 5\' 6"  (1.676 m)   Gen: Pleasant, Thin woman, in no distress,  normal affect  ENT: No lesions,  mouth clear,  oropharynx clear, no postnasal drip, somewhat hoarse voice  Neck: No JVD, no stridor  Lungs: No use of accessory muscles, distant, some scattered inspiratory crackles, no wheezing  Cardiovascular: RRR, heart sounds normal, no murmur or gallops, no peripheral edema  Musculoskeletal: No deformities, no cyanosis or clubbing  Neuro: alert, non  focal  Skin: Warm, no lesions or rashes     Assessment & Plan:  COPD (chronic obstructive pulmonary disease) (HCC) We will try starting Trelegy 1 inhalation daily.  Remember to rinse and gargle after using this medication.  If you benefit from the new medicine then let us know and we will order it through your pharmacy so that we can continue. Keep albuterol available to use 2 puffs up to every 4 hours if needed for shortness of breath, chest tightness, wheezing. Congratulations on cutting down her cigarettes.  You need to set a new goal to decrease even further.  Once we have decreased we may be able to set a quit date. Flu shot this Fall.  Follow with Dr Lamonte Sakai in 4 months or sooner if you have any problems.  Smoker Discussed strategies for cutting down with her today.  Baltazar Apo, MD, PhD 12/04/2017, 3:13 PM [facility] [phone] or if no answer [phone]

## 2017-12-05 ENCOUNTER — Other Ambulatory Visit: Payer: Self-pay | Admitting: Internal Medicine

## 2017-12-06 ENCOUNTER — Other Ambulatory Visit: Payer: Self-pay | Admitting: Internal Medicine

## 2017-12-06 MED ORDER — ALENDRONATE SODIUM 70 MG PO TABS: 70.0000 mg | ORAL_TABLET | ORAL | 3 refills | Status: DC

## 2017-12-07 ENCOUNTER — Telehealth: Payer: Self-pay

## 2017-12-07 NOTE — Telephone Encounter (Signed)
-----   Message from Biagio Borg, MD sent at 12/06/2017  8:43 PM EDT ----- Left message on MyChart, pt to cont same tx except  The test results show that your current treatment is OK, except the bone density testing is consistent with near osteoporosis.  We should start a medication called fosamax to help slow down the bone loss.  I will send a prescription and you should hear from the office as well  Shirron to please inform pt, I will do rx

## 2017-12-07 NOTE — Telephone Encounter (Signed)
Called pt, LVM.   CRM created.  

## 2017-12-18 DIAGNOSIS — H5203 Hypermetropia, bilateral: Secondary | ICD-10-CM | POA: Diagnosis not present

## 2017-12-18 DIAGNOSIS — H52209 Unspecified astigmatism, unspecified eye: Secondary | ICD-10-CM | POA: Diagnosis not present

## 2017-12-25 ENCOUNTER — Encounter: Payer: Self-pay | Admitting: Internal Medicine

## 2017-12-31 DIAGNOSIS — Z5181 Encounter for therapeutic drug level monitoring: Secondary | ICD-10-CM | POA: Diagnosis not present

## 2017-12-31 DIAGNOSIS — M792 Neuralgia and neuritis, unspecified: Secondary | ICD-10-CM | POA: Diagnosis not present

## 2017-12-31 DIAGNOSIS — M791 Myalgia, unspecified site: Secondary | ICD-10-CM | POA: Diagnosis not present

## 2017-12-31 DIAGNOSIS — M533 Sacrococcygeal disorders, not elsewhere classified: Secondary | ICD-10-CM | POA: Diagnosis not present

## 2018-01-01 ENCOUNTER — Ambulatory Visit: Payer: Self-pay | Admitting: Internal Medicine

## 2018-01-01 ENCOUNTER — Ambulatory Visit (INDEPENDENT_AMBULATORY_CARE_PROVIDER_SITE_OTHER): Payer: Medicare HMO | Admitting: Internal Medicine

## 2018-01-01 ENCOUNTER — Encounter: Payer: Self-pay | Admitting: Internal Medicine

## 2018-01-01 ENCOUNTER — Other Ambulatory Visit (INDEPENDENT_AMBULATORY_CARE_PROVIDER_SITE_OTHER): Payer: Medicare HMO

## 2018-01-01 VITALS — BP 116/72 | HR 67 | Temp 98.1°F | Ht 66.0 in | Wt 139.0 lb

## 2018-01-01 DIAGNOSIS — E119 Type 2 diabetes mellitus without complications: Secondary | ICD-10-CM

## 2018-01-01 DIAGNOSIS — E785 Hyperlipidemia, unspecified: Secondary | ICD-10-CM | POA: Diagnosis not present

## 2018-01-01 DIAGNOSIS — I10 Essential (primary) hypertension: Secondary | ICD-10-CM

## 2018-01-01 LAB — HEPATIC FUNCTION PANEL
ALT: 26 U/L (ref 0–35)
AST: 25 U/L (ref 0–37)
Albumin: 4.2 g/dL (ref 3.5–5.2)
Alkaline Phosphatase: 105 U/L (ref 39–117)
BILIRUBIN DIRECT: 0.1 mg/dL (ref 0.0–0.3)
Total Bilirubin: 0.5 mg/dL (ref 0.2–1.2)
Total Protein: 7.4 g/dL (ref 6.0–8.3)

## 2018-01-01 LAB — BASIC METABOLIC PANEL
BUN: 31 mg/dL — AB (ref 6–23)
CALCIUM: 9.7 mg/dL (ref 8.4–10.5)
CHLORIDE: 103 meq/L (ref 96–112)
CO2: 33 meq/L — AB (ref 19–32)
Creatinine, Ser: 0.93 mg/dL (ref 0.40–1.20)
GFR: 63.06 mL/min (ref >60.00)
Glucose, Bld: 188 mg/dL — ABNORMAL HIGH (ref 70–99)
POTASSIUM: 5.2 meq/L — AB (ref 3.5–5.1)
Sodium: 141 mEq/L (ref 135–145)

## 2018-01-01 LAB — LIPID PANEL
CHOLESTEROL: 130 mg/dL (ref 0–200)
HDL: 60 mg/dL (ref >39.00)
LDL Cholesterol: 37 mg/dL (ref 0–99)
NonHDL: 69.77
Total CHOL/HDL Ratio: 2
Triglycerides: 164 mg/dL — ABNORMAL HIGH (ref 0.0–149.0)
VLDL: 32.8 mg/dL (ref 0.0–40.0)

## 2018-01-01 LAB — HEMOGLOBIN A1C: Hgb A1c MFr Bld: 6.9 % — ABNORMAL HIGH (ref 4.6–6.5)

## 2018-01-01 NOTE — Progress Notes (Signed)
Subjective:    Patient ID: Crystal Thornton, female    DOB: 1946/02/16, 72 y.o.   MRN: [id number]  HPI  Here to f/u; overall doing ok,  Pt denies chest pain, increasing sob or doe, wheezing, orthopnea, PND, increased LE swelling, palpitations, dizziness or syncope.  Pt denies new neurological symptoms such as new headache, or facial or extremity weakness or numbness.  Pt denies polydipsia, polyuria, or low sugar episode.  Pt states overall good compliance with meds, mostly trying to follow appropriate diet, with wt overall stable,  Now lives at [facility] near the [facility] but declines to move her health care. Already had the flu shot per pulmonary  Just getting started on the fosamax.  Did not tolerate chantix Past Medical History:  Diagnosis Date  . ANGIOEDEMA 01/24/2010  . Anxiety   . ANXIETY 11/07/2006  . BACK PAIN 11/01/2007  . Cough 05/29/2008  . CYST, OVARIAN NEC/NOS 11/05/2006  . Depression   . DEPRESSION 11/07/2006  . Diabetes mellitus   . DIABETES MELLITUS, TYPE II 11/07/2006  . DIVERTICULOSIS, COLON 11/07/2006  . FIBROCYSTIC BREAST DISEASE 11/05/2006  . HEAD TRAUMA, CLOSED 01/24/2010  . HEPATITIS B, HX OF 11/05/2006  . Hepatitis C    Treated and no longer has  . HEPATITIS C 11/05/2006  . Hyperlipidemia   . HYPERLIPIDEMIA 11/07/2006  . Hypertension   . HYPERTENSION 05/29/2007  . INSOMNIA, HX OF 11/05/2006  . LOW BACK PAIN 05/29/2007  . Osteopenia   . OSTEOPENIA 11/07/2006  . Other specified sites of sprains and strains 08/31/2008  . Pain    R. shoulder and lower back and legs  . PULMONARY NODULE, LEFT UPPER LOBE 05/31/2009  . Seizures ([place])   . SHOULDER PAIN, RIGHT, CHRONIC 06/02/2010  . SINUSITIS- ACUTE-NOS 12/10/2007  . SKIN LESION 06/02/2010  . TOTAL ABDOMINAL HYSTERECTOMY, HX OF 03/27/1990  . Wears dentures    full top  . Wears glasses    Past Surgical History:  Procedure Laterality Date  . APPENDECTOMY    . CERVICAL FUSION  2013  . CESAREAN SECTION    . COLONOSCOPY    . EUS N/A  07/29/2015   Procedure: UPPER ENDOSCOPIC ULTRASOUND (EUS) LINEAR;  Surgeon: Milus Banister, MD;  Location: [facility];  Service: Endoscopy;  Laterality: N/A;  . EXTERNAL FIXATION LEG Left 12/12/2012   Procedure: LEFT ADJUSTMENT OF EXTERNAL FIXATION UNDER ANESTHESIA;  Surgeon: Wylene Simmer, MD;  Location: [facility];  Service: Orthopedics;  Laterality: Left;  . LIVER BIOPSY    . ORIF ANKLE FRACTURE Left 12/03/2012  . ORIF ANKLE FRACTURE Left 01/02/2013   Procedure: LEFT ANKLE REMOVAL OF EXTERNAL FIXATOR OPEN REDUCTION INTERNAL FIXATION (ORIF) TRIMALLEOLAR FRACTURE;  Surgeon: Wylene Simmer, MD;  Location: [facility];  Service: Orthopedics;  Laterality: Left;  . OVARIAN CYST SURGERY     ruptured  . SHOULDER ARTHROSCOPY  2008   left  . TUBAL LIGATION      reports that she has been smoking cigarettes. She has a 12.50 pack-year smoking history. She has never used smokeless tobacco. She reports that she does not drink alcohol or use drugs. family history includes Liver cancer in her mother. Allergies  Allergen Reactions  . Naproxen Sodium Anaphylaxis  . Sulfonamide Derivatives Other (See Comments)    Causes nephritis  . Chantix [Varenicline Tartrate] Other (See Comments)  . Ace Inhibitors Swelling    Lip swelling    Review of Systems  Constitutional: Negative for other  unusual diaphoresis or sweats HENT: Negative for ear discharge or swelling Eyes: Negative for other worsening visual disturbances Respiratory: Negative for stridor or other swelling  Gastrointestinal: Negative for worsening distension or other blood Genitourinary: Negative for retention or other urinary change Musculoskeletal: Negative for other MSK pain or swelling Skin: Negative for color change or other new lesions Neurological: Negative for worsening tremors and other numbness  Psychiatric/Behavioral: Negative for worsening agitation or other fatigue All other system neg per pt    Objective:     Physical Exam BP 116/72   Pulse 67   Temp 98.1 F (36.7 C) (Oral)   Ht 5\' 6"  (1.676 m)   Wt 139 lb (63 kg)   SpO2 91%   BMI 22.44 kg/m  VS noted,  Constitutional: Pt appears in NAD HENT: Head: NCAT.  Right Ear: External ear normal.  Left Ear: External ear normal.  Eyes: . Pupils are equal, round, and reactive to light. Conjunctivae and EOM are normal Nose: without d/c or deformity Neck: Neck supple. Gross normal ROM Cardiovascular: Normal rate and regular rhythm.   Pulmonary/Chest: Effort normal and breath sounds without rales or wheezing.  Abd:  Soft, NT, ND, + BS, no organomegaly Neurological: Pt is alert. At baseline orientation, motor grossly intact Skin: Skin is warm. No rashes, other new lesions, no LE edema Psychiatric: Pt behavior is normal without agitation  No other exam findings  Lab Results  Component Value Date   WBC 6.1 08/10/2017   HGB 13.6 08/10/2017   HCT 40.4 08/10/2017   PLT 221 08/10/2017   GLUCOSE 157 (H) 08/10/2017   CHOL 112 06/29/2017   TRIG 182.0 (H) 06/29/2017   HDL 49.90 06/29/2017   LDLDIRECT 103.0 11/17/2015   LDLCALC 26 06/29/2017   ALT 23 06/29/2017   AST 26 06/29/2017   NA 142 08/10/2017   K 3.6 08/10/2017   CL 105 08/10/2017   CREATININE 0.71 08/10/2017   BUN 16 08/10/2017   CO2 26 08/10/2017   TSH 1.44 06/29/2017   HGBA1C 7.1 (H) 06/29/2017   MICROALBUR 13.4 (H) 06/29/2017        Assessment & Plan:

## 2018-01-01 NOTE — Patient Instructions (Addendum)

## 2018-01-01 NOTE — Assessment & Plan Note (Signed)
stable overall by history and exam, recent data reviewed with pt, and pt to continue medical treatment as before,  to f/u any worsening symptoms or concerns  

## 2018-01-10 MED ORDER — FLUTICASONE-UMECLIDIN-VILANT 100-62.5-25 MCG/INH IN AEPB: 1.0000 | INHALATION_SPRAY | Freq: Every day | RESPIRATORY_TRACT | 12 refills | Status: DC

## 2018-01-10 NOTE — Telephone Encounter (Signed)
Taken care of nothing further needed at this time.

## 2018-01-10 NOTE — Telephone Encounter (Signed)
Refill sent pre the other e-mail today and patient message back nothing further needed at this time.

## 2018-04-01 ENCOUNTER — Other Ambulatory Visit: Payer: Self-pay | Admitting: Internal Medicine

## 2018-04-01 DIAGNOSIS — F32A Depression, unspecified: Secondary | ICD-10-CM

## 2018-04-01 DIAGNOSIS — F329 Major depressive disorder, single episode, unspecified: Secondary | ICD-10-CM

## 2018-04-16 ENCOUNTER — Encounter: Payer: Self-pay | Admitting: Internal Medicine

## 2018-04-16 MED ORDER — CLONAZEPAM 1 MG PO TABS: 1.0000 mg | ORAL_TABLET | Freq: Two times a day (BID) | ORAL | 2 refills | Status: DC | PRN

## 2018-04-16 NOTE — Telephone Encounter (Signed)
Done erx 

## 2018-05-13 ENCOUNTER — Telehealth: Payer: Self-pay

## 2018-05-13 MED ORDER — FLUTICASONE-UMECLIDIN-VILANT 100-62.5-25 MCG/INH IN AEPB: 1.0000 | INHALATION_SPRAY | Freq: Every day | RESPIRATORY_TRACT | 3 refills | Status: DC

## 2018-05-13 MED ORDER — ALBUTEROL SULFATE HFA 108 (90 BASE) MCG/ACT IN AERS: 2.0000 | INHALATION_SPRAY | Freq: Four times a day (QID) | RESPIRATORY_TRACT | 0 refills | Status: DC | PRN

## 2018-05-13 NOTE — Telephone Encounter (Signed)
Call made to patient, confirmed pharmacy, refills sent. F/U appt made for April per patient request. Nothing further is needed at this time.   Received mychart message stating:  Dr Lamonte Sakai,    I have moved back to [facility] and need my Trelegy and Albuterol prescriptions called in to [facility] as I had transferred them to [facility].     Thank you, Crystal Thornton

## 2018-05-29 DIAGNOSIS — M48062 Spinal stenosis, lumbar region with neurogenic claudication: Secondary | ICD-10-CM | POA: Diagnosis not present

## 2018-05-29 DIAGNOSIS — M47897 Other spondylosis, lumbosacral region: Secondary | ICD-10-CM | POA: Diagnosis not present

## 2018-05-29 DIAGNOSIS — M792 Neuralgia and neuritis, unspecified: Secondary | ICD-10-CM | POA: Diagnosis not present

## 2018-05-29 DIAGNOSIS — M533 Sacrococcygeal disorders, not elsewhere classified: Secondary | ICD-10-CM | POA: Diagnosis not present

## 2018-05-29 DIAGNOSIS — Z5181 Encounter for therapeutic drug level monitoring: Secondary | ICD-10-CM | POA: Diagnosis not present

## 2018-05-29 DIAGNOSIS — M791 Myalgia, unspecified site: Secondary | ICD-10-CM | POA: Diagnosis not present

## 2018-05-29 DIAGNOSIS — G894 Chronic pain syndrome: Secondary | ICD-10-CM | POA: Diagnosis not present

## 2018-06-04 DIAGNOSIS — M533 Sacrococcygeal disorders, not elsewhere classified: Secondary | ICD-10-CM | POA: Diagnosis not present

## 2018-06-04 DIAGNOSIS — M792 Neuralgia and neuritis, unspecified: Secondary | ICD-10-CM | POA: Diagnosis not present

## 2018-06-04 DIAGNOSIS — M791 Myalgia, unspecified site: Secondary | ICD-10-CM | POA: Diagnosis not present

## 2018-06-04 DIAGNOSIS — Z5181 Encounter for therapeutic drug level monitoring: Secondary | ICD-10-CM | POA: Diagnosis not present

## 2018-06-05 ENCOUNTER — Encounter: Payer: Self-pay | Admitting: Internal Medicine

## 2018-06-05 ENCOUNTER — Other Ambulatory Visit: Payer: Self-pay | Admitting: Internal Medicine

## 2018-06-05 DIAGNOSIS — Z1231 Encounter for screening mammogram for malignant neoplasm of breast: Secondary | ICD-10-CM

## 2018-06-05 DIAGNOSIS — I10 Essential (primary) hypertension: Secondary | ICD-10-CM

## 2018-06-05 DIAGNOSIS — E119 Type 2 diabetes mellitus without complications: Secondary | ICD-10-CM

## 2018-06-05 MED ORDER — ATENOLOL 50 MG PO TABS: 50.0000 mg | ORAL_TABLET | Freq: Every day | ORAL | 3 refills | Status: DC

## 2018-06-05 MED ORDER — METFORMIN HCL 500 MG PO TABS: ORAL_TABLET | ORAL | 3 refills | Status: DC

## 2018-06-09 ENCOUNTER — Other Ambulatory Visit: Payer: Self-pay | Admitting: Internal Medicine

## 2018-06-10 NOTE — Telephone Encounter (Signed)
Done erx 

## 2018-06-14 ENCOUNTER — Inpatient Hospital Stay: Admission: RE | Admit: 2018-06-14 | Payer: Self-pay | Source: Ambulatory Visit

## 2018-06-24 ENCOUNTER — Encounter: Payer: Self-pay | Admitting: *Deleted

## 2018-07-02 ENCOUNTER — Ambulatory Visit: Payer: Self-pay | Admitting: Internal Medicine

## 2018-07-03 DIAGNOSIS — G894 Chronic pain syndrome: Secondary | ICD-10-CM | POA: Diagnosis not present

## 2018-07-03 DIAGNOSIS — M4322 Fusion of spine, cervical region: Secondary | ICD-10-CM | POA: Diagnosis not present

## 2018-07-08 ENCOUNTER — Ambulatory Visit: Payer: Self-pay | Admitting: Emergency Medicine

## 2018-07-24 ENCOUNTER — Other Ambulatory Visit: Payer: Self-pay

## 2018-07-24 ENCOUNTER — Encounter: Payer: Self-pay | Admitting: Internal Medicine

## 2018-07-24 ENCOUNTER — Telehealth: Payer: Self-pay | Admitting: Family

## 2018-07-24 ENCOUNTER — Ambulatory Visit: Payer: Self-pay | Admitting: *Deleted

## 2018-07-24 ENCOUNTER — Ambulatory Visit (INDEPENDENT_AMBULATORY_CARE_PROVIDER_SITE_OTHER): Payer: Medicare Other | Admitting: Internal Medicine

## 2018-07-24 DIAGNOSIS — E119 Type 2 diabetes mellitus without complications: Secondary | ICD-10-CM

## 2018-07-24 DIAGNOSIS — R05 Cough: Secondary | ICD-10-CM | POA: Diagnosis not present

## 2018-07-24 DIAGNOSIS — J441 Chronic obstructive pulmonary disease with (acute) exacerbation: Secondary | ICD-10-CM | POA: Diagnosis not present

## 2018-07-24 DIAGNOSIS — R6889 Other general symptoms and signs: Principal | ICD-10-CM

## 2018-07-24 DIAGNOSIS — R0602 Shortness of breath: Secondary | ICD-10-CM

## 2018-07-24 DIAGNOSIS — R059 Cough, unspecified: Secondary | ICD-10-CM

## 2018-07-24 DIAGNOSIS — Z20822 Contact with and (suspected) exposure to covid-19: Secondary | ICD-10-CM

## 2018-07-24 MED ORDER — HYDROCODONE-HOMATROPINE 5-1.5 MG/5ML PO SYRP: 5.0000 mL | ORAL_SOLUTION | Freq: Four times a day (QID) | ORAL | 0 refills | Status: AC | PRN

## 2018-07-24 MED ORDER — METHYLPREDNISOLONE 4 MG PO TBPK: ORAL_TABLET | ORAL | 0 refills | Status: DC

## 2018-07-24 MED ORDER — AZITHROMYCIN 250 MG PO TABS: ORAL_TABLET | ORAL | 0 refills | Status: DC

## 2018-07-24 NOTE — Patient Instructions (Signed)
Please take all new medication as prescribed - the antibiotic, cough medicine, and medrol   Please continue all other medications as before, including your trelegy and albuterol inhalers  Please have the pharmacy call with any other refills you may need.  Please continue your efforts at being more active, low cholesterol diet, and weight control.  Please keep your appointments with your specialists as you may have planned

## 2018-07-24 NOTE — Telephone Encounter (Addendum)
Patient calls with shortness of breath (can't get a deep breath) and has intermittent chest tightness. Occasional cough with phlegm. Has COPD but these symptoms are different than her usual SOB and cough. No fever. No travels and No known exposures. Has increased fatigue also. These symptoms have occurred over the last week. Stated she would like to be tested. Informed her with a  virtual visit Dr. Jenny Reichmann would advise her. Cell phone up to date in contacts. Warm transferred for appointment.   Reason for Disposition . MILD difficulty breathing (e.g., minimal/no SOB at rest, SOB with walking, pulse <100)  Answer Assessment - Initial Assessment Questions 1. COVID-19 DIAGNOSIS: "Who made your Coronavirus (COVID-19) diagnosis?" "Was it confirmed by a positive lab test?" If not diagnosed by a HCP, ask "Are there lots of cases (community spread) where you live?" (See public health department website, if unsure)   * MAJOR community spread: high number of cases; numbers of cases are increasing; many people hospitalized.   * MINOR community spread: low number of cases; not increasing; few or no people hospitalized     Has symptoms no diagnosis. 2. ONSET: "When did the COVID-19 symptoms start?"      About one week ago 3. WORST SYMPTOM: "What is your worst symptom?" (e.g., cough, fever, shortness of breath, muscle aches)     Shortness of breath 4. COUGH: "How bad is the cough?"       Not bad just different 5. FEVER: "Do you have a fever?" If so, ask: "What is your temperature, how was it measured, and when did it start?"     No  6. RESPIRATORY STATUS: "Describe your breathing?" (e.g., shortness of breath, wheezing, unable to speak)      Wheezing but is her normal. 7. BETTER-SAME-WORSE: "Are you getting better, staying the same or getting worse compared to yesterday?"  If getting worse, ask, "In what way?"    worse 8. HIGH RISK DISEASE: "Do you have any chronic medical problems?" (e.g., asthma, heart or lung  disease, weak immune system, etc.)     COPD 9. PREGNANCY: "Is there any chance you are pregnant?" "When was your last menstrual period?"    na 10. OTHER SYMPTOMS: "Do you have any other symptoms?"  (e.g., runny nose, headache, sore throat, loss of smell)       none  Protocols used: CORONAVIRUS (COVID-19) DIAGNOSED OR SUSPECTED-A-AH

## 2018-07-24 NOTE — Progress Notes (Signed)
Patient ID: Crystal Thornton, female   DOB: 01-Dec-1945, 73 y.o.   MRN: [id number]  Virtual Visit via Video Note  I connected with Crystal Thornton on 07/24/18 at  1:00 PM EDT by a video enabled telemedicine application and verified that I am speaking with the correct person using two identifiers. Pt is at home, I am at the office, and no other persons present   I discussed the limitations of evaluation and management by telemedicine and the availability of in person appointments. The patient expressed understanding and agreed to proceed.  History of Present Illness: Here with acute onset mild to mod 1 wks ST, HA, general weakness and malaise, with prod cough greenish sputum, but Pt denies chest pain, increased sob or doe, wheezing, orthopnea, PND, increased LE swelling, palpitations, dizziness or syncope, except for onset mild worwsening sob and wheezing in last 2 days.   Pt denies polydipsia, polyuria, cbgs remain low 100's.  Pt denies new neurological symptoms such as new headache, or facial or extremity weakness or numbness    Past Medical History:  Diagnosis Date  . ANGIOEDEMA 01/24/2010  . Anxiety   . ANXIETY 11/07/2006  . BACK PAIN 11/01/2007  . Cough 05/29/2008  . CYST, OVARIAN NEC/NOS 11/05/2006  . Depression   . DEPRESSION 11/07/2006  . Diabetes mellitus   . DIABETES MELLITUS, TYPE II 11/07/2006  . DIVERTICULOSIS, COLON 11/07/2006  . FIBROCYSTIC BREAST DISEASE 11/05/2006  . HEAD TRAUMA, CLOSED 01/24/2010  . HEPATITIS B, HX OF 11/05/2006  . Hepatitis C    Treated and no longer has  . HEPATITIS C 11/05/2006  . Hyperlipidemia   . HYPERLIPIDEMIA 11/07/2006  . Hypertension   . HYPERTENSION 05/29/2007  . INSOMNIA, HX OF 11/05/2006  . LOW BACK PAIN 05/29/2007  . Osteopenia   . OSTEOPENIA 11/07/2006  . Other specified sites of sprains and strains 08/31/2008  . Pain    R. shoulder and lower back and legs  . PULMONARY NODULE, LEFT UPPER LOBE 05/31/2009  . Seizures ([place])   . SHOULDER PAIN, RIGHT, CHRONIC  06/02/2010  . SINUSITIS- ACUTE-NOS 12/10/2007  . SKIN LESION 06/02/2010  . TOTAL ABDOMINAL HYSTERECTOMY, HX OF 03/27/1990  . Wears dentures    full top  . Wears glasses    Past Surgical History:  Procedure Laterality Date  . APPENDECTOMY    . CERVICAL FUSION  2013  . CESAREAN SECTION    . COLONOSCOPY    . EUS N/A 07/29/2015   Procedure: UPPER ENDOSCOPIC ULTRASOUND (EUS) LINEAR;  Surgeon: Milus Banister, MD;  Location: [facility];  Service: Endoscopy;  Laterality: N/A;  . EXTERNAL FIXATION LEG Left 12/12/2012   Procedure: LEFT ADJUSTMENT OF EXTERNAL FIXATION UNDER ANESTHESIA;  Surgeon: Wylene Simmer, MD;  Location: [facility];  Service: Orthopedics;  Laterality: Left;  . LIVER BIOPSY    . ORIF ANKLE FRACTURE Left 12/03/2012  . ORIF ANKLE FRACTURE Left 01/02/2013   Procedure: LEFT ANKLE REMOVAL OF EXTERNAL FIXATOR OPEN REDUCTION INTERNAL FIXATION (ORIF) TRIMALLEOLAR FRACTURE;  Surgeon: Wylene Simmer, MD;  Location: [facility];  Service: Orthopedics;  Laterality: Left;  . OVARIAN CYST SURGERY     ruptured  . SHOULDER ARTHROSCOPY  2008   left  . TUBAL LIGATION      reports that she has been smoking cigarettes. She has a 12.50 pack-year smoking history. She has never used smokeless tobacco. She reports that she does not drink alcohol or use drugs. family history includes Liver cancer in  her mother. Allergies  Allergen Reactions  . Naproxen Sodium Anaphylaxis  . Sulfonamide Derivatives Other (See Comments)    Causes nephritis  . Chantix [Varenicline Tartrate] Other (See Comments)  . Ace Inhibitors Swelling    Lip swelling   Current Outpatient Medications on File Prior to Visit  Medication Sig Dispense Refill  . albuterol (PROVENTIL HFA;VENTOLIN HFA) 108 (90 Base) MCG/ACT inhaler Inhale 2 puffs into the lungs every 6 (six) hours as needed for wheezing or shortness of breath. 3 Inhaler 0  . alendronate (FOSAMAX) 70 MG tablet Take 1 tablet (70 mg total) by mouth every  7 (seven) days. Take with a full glass of water on an empty stomach. 12 tablet 3  . atenolol (TENORMIN) 50 MG tablet Take 1 tablet (50 mg total) by mouth daily. 90 tablet 3  . clonazePAM (KLONOPIN) 1 MG tablet TAKE 1 TABLET BY MOUTH TWICE DAILY AS NEEDED 60 tablet 2  . Fluticasone-Umeclidin-Vilant (TRELEGY ELLIPTA) 100-62.5-25 MCG/INH AEPB Inhale 1 puff into the lungs daily. 60 each 3  . Ginseng 100 MG CAPS Take 100 mg by mouth daily.    Marland Kitchen ipratropium (ATROVENT HFA) 17 MCG/ACT inhaler Inhale 2 puffs into the lungs every 6 (six) hours as needed for wheezing. 1 Inhaler 12  . metFORMIN (GLUCOPHAGE) 500 MG tablet TAKE 2 TABLETS BY MOUTH Twice daily 360 tablet 3  . Misc Natural Products (GINKOGIN PO) Take by mouth.    . Multiple Vitamins-Minerals (CENTRUM SILVER PO) Take 1 tablet by mouth daily.      Marland Kitchen oxycodone (ROXICODONE) 30 MG immediate release tablet 30 mg 5 times daily as needed for pain. 20 tablet 0  . rosuvastatin (CRESTOR) 40 MG tablet Take 1 tablet (40 mg total) by mouth daily. 90 tablet 3  . sertraline (ZOLOFT) 100 MG tablet TAKE 2 TABLETS BY MOUTH DAILY 180 tablet 1  . Spacer/Aero-Holding Chambers (AEROCHAMBER PLUS WITH MASK) inhaler Use as instructed 1 each 2  . tiotropium (SPIRIVA) 18 MCG inhalation capsule Place 1 capsule (18 mcg total) into inhaler and inhale daily. 30 capsule 5   No current facility-administered medications on file prior to visit.     Observations/Objective: Alert, NAD, mild ill, resps normal, cn 2-12 intact, moves all4s Lab Results  Component Value Date   WBC 6.1 08/10/2017   HGB 13.6 08/10/2017   HCT 40.4 08/10/2017   PLT 221 08/10/2017   GLUCOSE 188 (H) 01/01/2018   CHOL 130 01/01/2018   TRIG 164.0 (H) 01/01/2018   HDL 60.00 01/01/2018   LDLDIRECT 103.0 11/17/2015   LDLCALC 37 01/01/2018   ALT 26 01/01/2018   AST 25 01/01/2018   NA 141 01/01/2018   K 5.2 (H) 01/01/2018   CL 103 01/01/2018   CREATININE 0.93 01/01/2018   BUN 31 (H) 01/01/2018   CO2  33 (H) 01/01/2018   TSH 1.44 06/29/2017   HGBA1C 6.9 (H) 01/01/2018   MICROALBUR 13.4 (H) 06/29/2017   Assessment and Plan: See notes  Follow Up Instructions: See notes   I discussed the assessment and treatment plan with the patient. The patient was provided an opportunity to ask questions and all were answered. The patient agreed with the plan and demonstrated an understanding of the instructions.   The patient was advised to call back or seek an in-person evaluation if the symptoms worsen or if the condition fails to improve as anticipated.   Cathlean Cower, MD

## 2018-07-24 NOTE — Progress Notes (Signed)
Based on what you shared with me, I feel your condition warrants further evaluation and I recommend that you be seen for a face to face office visit.  Based on your symptoms of shortness of breath and age you need to be seen face to face.    NOTE: If you entered your credit card information for this eVisit, you will not be charged. You may see a "hold" on your card for the $35 but that hold will drop off and you will not have a charge processed.  If you are having a true medical emergency please call 911.  If you need an urgent face to face visit,  has four urgent care centers for your convenience.    PLEASE NOTE: THE [facility] LOCATIONS AND URGENT CARE CLINICS DO NOT HAVE THE TESTING FOR CORONAVIRUS COVID19 AVAILABLE.  IF YOU FEEL YOU NEED THIS TEST YOU MUST GO TO A TRIAGE LOCATION AT [facility]   [email or web address] to reserve your spot online an avoid wait times  [facility] [address]  [place], [place] [address] Modified hours of operation: Monday-Friday, 10 AM to 6 PM  Saturday & Sunday 10 AM to 4 PM *Across the street from [place] (New Address!) [address] [place], [place] [address] *Just off [address], across the road from [place] hours of operation: Monday-Friday, 10 AM to 5 PM  Closed Saturday & Sunday   The following sites will take your insurance:  . [facility] [place] a Provider at this Location  [address] [place], [place] [address] . 10 am to 8 pm Monday-Friday . 12 pm to 8 pm Saturday-Sunday   . [facility] at [place] a Provider at this Location  [address] [place], [place], [place] [address] . 8 am to 8 pm Monday-Friday . 9 am to 6 pm Saturday . 11 am to 6 pm Sunday   . [facility] Urgent Care at [place] Get Driving Directions  [address] [place],  [address] . 8 am to 8 pm Monday-Friday . 8 am to 4 pm Saturday-Sunday   Your e-visit answers were reviewed by a board certified advanced clinical practitioner to complete your personal care plan.  Thank you for using e-Visits.

## 2018-07-24 NOTE — Assessment & Plan Note (Signed)
Mild to mod, for antibx course,  to f/u any worsening symptoms or concerns 

## 2018-07-24 NOTE — Telephone Encounter (Signed)
Pt has a video visit with PCP today at 1pm.

## 2018-07-24 NOTE — Assessment & Plan Note (Signed)
stable overall by history and exam, recent data reviewed with pt, and pt to continue medical treatment as before,  to f/u any worsening symptoms or concerns  

## 2018-07-24 NOTE — Assessment & Plan Note (Signed)
Mild to mod, for cough med prn, medrol pack asd,  to f/u any worsening symptoms or concerns

## 2018-07-31 ENCOUNTER — Telehealth: Payer: Self-pay

## 2018-07-31 DIAGNOSIS — Z5181 Encounter for therapeutic drug level monitoring: Secondary | ICD-10-CM | POA: Diagnosis not present

## 2018-07-31 DIAGNOSIS — M792 Neuralgia and neuritis, unspecified: Secondary | ICD-10-CM | POA: Diagnosis not present

## 2018-07-31 DIAGNOSIS — M791 Myalgia, unspecified site: Secondary | ICD-10-CM | POA: Diagnosis not present

## 2018-07-31 DIAGNOSIS — I10 Essential (primary) hypertension: Secondary | ICD-10-CM

## 2018-07-31 DIAGNOSIS — E785 Hyperlipidemia, unspecified: Secondary | ICD-10-CM

## 2018-07-31 DIAGNOSIS — M533 Sacrococcygeal disorders, not elsewhere classified: Secondary | ICD-10-CM | POA: Diagnosis not present

## 2018-07-31 DIAGNOSIS — E119 Type 2 diabetes mellitus without complications: Secondary | ICD-10-CM

## 2018-07-31 NOTE — Telephone Encounter (Signed)
Lab orders has expired. New orders entered.

## 2018-08-02 ENCOUNTER — Other Ambulatory Visit (INDEPENDENT_AMBULATORY_CARE_PROVIDER_SITE_OTHER): Payer: Medicare Other

## 2018-08-02 DIAGNOSIS — E119 Type 2 diabetes mellitus without complications: Secondary | ICD-10-CM

## 2018-08-02 DIAGNOSIS — I10 Essential (primary) hypertension: Secondary | ICD-10-CM

## 2018-08-02 DIAGNOSIS — E785 Hyperlipidemia, unspecified: Secondary | ICD-10-CM

## 2018-08-02 LAB — URINALYSIS, ROUTINE W REFLEX MICROSCOPIC
Bilirubin Urine: NEGATIVE
Hgb urine dipstick: NEGATIVE
Leukocytes,Ua: NEGATIVE
Nitrite: NEGATIVE
Specific Gravity, Urine: >=1.03 — AB (ref 1.000–1.030)
Total Protein, Urine: NEGATIVE
Urine Glucose: NEGATIVE
Urobilinogen, UA: 0.2 (ref 0.0–1.0)
pH: 6 (ref 5.0–8.0)

## 2018-08-02 LAB — BASIC METABOLIC PANEL
BUN: 17 mg/dL (ref 6–23)
CO2: 29 mEq/L (ref 19–32)
Calcium: 10 mg/dL (ref 8.4–10.5)
Chloride: 106 mEq/L (ref 96–112)
Creatinine, Ser: 0.73 mg/dL (ref 0.40–1.20)
GFR: 78.33 mL/min (ref >60.00)
Glucose, Bld: 127 mg/dL — ABNORMAL HIGH (ref 70–99)
Potassium: 4.9 mEq/L (ref 3.5–5.1)
Sodium: 144 mEq/L (ref 135–145)

## 2018-08-02 LAB — CBC WITH DIFFERENTIAL/PLATELET
Basophils Absolute: 0 10*3/uL (ref 0.0–0.1)
Basophils Relative: 0.4 % (ref 0.0–3.0)
Eosinophils Absolute: 0.3 10*3/uL (ref 0.0–0.7)
Eosinophils Relative: 3.2 % (ref 0.0–5.0)
HCT: 45.4 % (ref 36.0–46.0)
Hemoglobin: 15.5 g/dL — ABNORMAL HIGH (ref 12.0–15.0)
Lymphocytes Relative: 12.7 % (ref 12.0–46.0)
Lymphs Abs: 1.1 10*3/uL (ref 0.7–4.0)
MCHC: 34 g/dL (ref 30.0–36.0)
MCV: 92 fl (ref 78.0–100.0)
Monocytes Absolute: 0.7 10*3/uL (ref 0.1–1.0)
Monocytes Relative: 7.6 % (ref 3.0–12.0)
Neutro Abs: 6.9 10*3/uL (ref 1.4–7.7)
Neutrophils Relative %: 76.1 % (ref 43.0–77.0)
Platelets: 244 10*3/uL (ref 150.0–400.0)
RBC: 4.94 Mil/uL (ref 3.87–5.11)
RDW: 14.1 % (ref 11.5–15.5)
WBC: 9 10*3/uL (ref 4.0–10.5)

## 2018-08-02 LAB — LIPID PANEL
Cholesterol: 210 mg/dL — ABNORMAL HIGH (ref 0–200)
HDL: 55.1 mg/dL (ref >39.00)
LDL Cholesterol: 118 mg/dL — ABNORMAL HIGH (ref 0–99)
NonHDL: 154.62
Total CHOL/HDL Ratio: 4
Triglycerides: 181 mg/dL — ABNORMAL HIGH (ref 0.0–149.0)
VLDL: 36.2 mg/dL (ref 0.0–40.0)

## 2018-08-02 LAB — HEMOGLOBIN A1C: Hgb A1c MFr Bld: 7.1 % — ABNORMAL HIGH (ref 4.6–6.5)

## 2018-08-02 LAB — HEPATIC FUNCTION PANEL
ALT: 15 U/L (ref 0–35)
AST: 16 U/L (ref 0–37)
Albumin: 4.1 g/dL (ref 3.5–5.2)
Alkaline Phosphatase: 110 U/L (ref 39–117)
Bilirubin, Direct: 0.1 mg/dL (ref 0.0–0.3)
Total Bilirubin: 0.4 mg/dL (ref 0.2–1.2)
Total Protein: 7.4 g/dL (ref 6.0–8.3)

## 2018-08-02 LAB — TSH: TSH: 0.44 u[IU]/mL (ref 0.35–4.50)

## 2018-08-05 DIAGNOSIS — Z5181 Encounter for therapeutic drug level monitoring: Secondary | ICD-10-CM | POA: Diagnosis not present

## 2018-08-05 DIAGNOSIS — M791 Myalgia, unspecified site: Secondary | ICD-10-CM | POA: Diagnosis not present

## 2018-08-05 DIAGNOSIS — M533 Sacrococcygeal disorders, not elsewhere classified: Secondary | ICD-10-CM | POA: Diagnosis not present

## 2018-08-05 DIAGNOSIS — M792 Neuralgia and neuritis, unspecified: Secondary | ICD-10-CM | POA: Diagnosis not present

## 2018-08-06 ENCOUNTER — Ambulatory Visit: Payer: Self-pay | Admitting: Internal Medicine

## 2018-08-16 ENCOUNTER — Ambulatory Visit: Payer: Self-pay

## 2018-09-02 DIAGNOSIS — M533 Sacrococcygeal disorders, not elsewhere classified: Secondary | ICD-10-CM | POA: Diagnosis not present

## 2018-09-02 DIAGNOSIS — M48062 Spinal stenosis, lumbar region with neurogenic claudication: Secondary | ICD-10-CM | POA: Diagnosis not present

## 2018-09-02 DIAGNOSIS — M792 Neuralgia and neuritis, unspecified: Secondary | ICD-10-CM | POA: Diagnosis not present

## 2018-09-02 DIAGNOSIS — M791 Myalgia, unspecified site: Secondary | ICD-10-CM | POA: Diagnosis not present

## 2018-09-02 DIAGNOSIS — Z5181 Encounter for therapeutic drug level monitoring: Secondary | ICD-10-CM | POA: Diagnosis not present

## 2018-09-02 DIAGNOSIS — M9951 Intervertebral disc stenosis of neural canal of cervical region: Secondary | ICD-10-CM | POA: Diagnosis not present

## 2018-09-02 DIAGNOSIS — G8929 Other chronic pain: Secondary | ICD-10-CM | POA: Diagnosis not present

## 2018-09-02 DIAGNOSIS — M47897 Other spondylosis, lumbosacral region: Secondary | ICD-10-CM | POA: Diagnosis not present

## 2018-09-30 ENCOUNTER — Other Ambulatory Visit: Payer: Self-pay | Admitting: Internal Medicine

## 2018-09-30 DIAGNOSIS — F32A Depression, unspecified: Secondary | ICD-10-CM

## 2018-09-30 DIAGNOSIS — F329 Major depressive disorder, single episode, unspecified: Secondary | ICD-10-CM

## 2018-10-02 DIAGNOSIS — M791 Myalgia, unspecified site: Secondary | ICD-10-CM | POA: Diagnosis not present

## 2018-10-02 DIAGNOSIS — M533 Sacrococcygeal disorders, not elsewhere classified: Secondary | ICD-10-CM | POA: Diagnosis not present

## 2018-10-02 DIAGNOSIS — M792 Neuralgia and neuritis, unspecified: Secondary | ICD-10-CM | POA: Diagnosis not present

## 2018-10-02 DIAGNOSIS — Z5181 Encounter for therapeutic drug level monitoring: Secondary | ICD-10-CM | POA: Diagnosis not present

## 2018-10-03 ENCOUNTER — Encounter: Payer: Self-pay | Admitting: Internal Medicine

## 2018-10-03 MED ORDER — CLONAZEPAM 1 MG PO TABS: 1.0000 mg | ORAL_TABLET | Freq: Two times a day (BID) | ORAL | 2 refills | Status: DC | PRN

## 2018-10-15 ENCOUNTER — Ambulatory Visit: Payer: Self-pay | Admitting: Emergency Medicine

## 2018-10-28 ENCOUNTER — Encounter: Payer: Self-pay | Admitting: Internal Medicine

## 2018-10-28 ENCOUNTER — Other Ambulatory Visit: Payer: Self-pay | Admitting: Internal Medicine

## 2018-10-28 DIAGNOSIS — E119 Type 2 diabetes mellitus without complications: Secondary | ICD-10-CM

## 2018-10-29 ENCOUNTER — Ambulatory Visit: Payer: Medicare Other | Admitting: Emergency Medicine

## 2018-10-30 DIAGNOSIS — Z5181 Encounter for therapeutic drug level monitoring: Secondary | ICD-10-CM | POA: Diagnosis not present

## 2018-10-30 DIAGNOSIS — M791 Myalgia, unspecified site: Secondary | ICD-10-CM | POA: Diagnosis not present

## 2018-10-30 DIAGNOSIS — M533 Sacrococcygeal disorders, not elsewhere classified: Secondary | ICD-10-CM | POA: Diagnosis not present

## 2018-10-30 DIAGNOSIS — M792 Neuralgia and neuritis, unspecified: Secondary | ICD-10-CM | POA: Diagnosis not present

## 2018-10-31 ENCOUNTER — Encounter: Payer: Self-pay | Admitting: Internal Medicine

## 2018-10-31 MED ORDER — ONETOUCH VERIO FLEX SYSTEM W/DEVICE KIT: PACK | 0 refills | Status: AC

## 2018-10-31 MED ORDER — ONETOUCH VERIO VI STRP: ORAL_STRIP | 12 refills | Status: AC

## 2018-11-23 ENCOUNTER — Ambulatory Visit: Payer: Medicare Other

## 2018-11-23 ENCOUNTER — Other Ambulatory Visit: Payer: Self-pay

## 2018-11-25 ENCOUNTER — Other Ambulatory Visit: Payer: Self-pay

## 2018-11-25 ENCOUNTER — Ambulatory Visit
Admission: RE | Admit: 2018-11-25 | Discharge: 2018-11-25 | Disposition: A | Discharge status: Home / Self Care | Payer: Medicare Other | Source: Ambulatory Visit | Attending: Internal Medicine | Admitting: Internal Medicine

## 2018-11-25 DIAGNOSIS — Z1231 Encounter for screening mammogram for malignant neoplasm of breast: Secondary | ICD-10-CM

## 2018-11-27 ENCOUNTER — Ambulatory Visit: Payer: Medicare Other

## 2018-11-27 DIAGNOSIS — M533 Sacrococcygeal disorders, not elsewhere classified: Secondary | ICD-10-CM | POA: Diagnosis not present

## 2018-11-27 DIAGNOSIS — M791 Myalgia, unspecified site: Secondary | ICD-10-CM | POA: Diagnosis not present

## 2018-11-27 DIAGNOSIS — Z5181 Encounter for therapeutic drug level monitoring: Secondary | ICD-10-CM | POA: Diagnosis not present

## 2018-11-27 DIAGNOSIS — M792 Neuralgia and neuritis, unspecified: Secondary | ICD-10-CM | POA: Diagnosis not present

## 2018-12-25 DIAGNOSIS — Z5181 Encounter for therapeutic drug level monitoring: Secondary | ICD-10-CM | POA: Diagnosis not present

## 2018-12-25 DIAGNOSIS — G8929 Other chronic pain: Secondary | ICD-10-CM | POA: Diagnosis not present

## 2018-12-25 DIAGNOSIS — M47897 Other spondylosis, lumbosacral region: Secondary | ICD-10-CM | POA: Diagnosis not present

## 2018-12-25 DIAGNOSIS — M792 Neuralgia and neuritis, unspecified: Secondary | ICD-10-CM | POA: Diagnosis not present

## 2018-12-25 DIAGNOSIS — M533 Sacrococcygeal disorders, not elsewhere classified: Secondary | ICD-10-CM | POA: Diagnosis not present

## 2018-12-25 DIAGNOSIS — M4322 Fusion of spine, cervical region: Secondary | ICD-10-CM | POA: Diagnosis not present

## 2018-12-25 DIAGNOSIS — M4807 Spinal stenosis, lumbosacral region: Secondary | ICD-10-CM | POA: Diagnosis not present

## 2018-12-25 DIAGNOSIS — G894 Chronic pain syndrome: Secondary | ICD-10-CM | POA: Diagnosis not present

## 2018-12-25 DIAGNOSIS — M48062 Spinal stenosis, lumbar region with neurogenic claudication: Secondary | ICD-10-CM | POA: Diagnosis not present

## 2018-12-25 DIAGNOSIS — M791 Myalgia, unspecified site: Secondary | ICD-10-CM | POA: Diagnosis not present

## 2019-01-02 ENCOUNTER — Encounter: Payer: Self-pay | Admitting: Internal Medicine

## 2019-01-02 ENCOUNTER — Ambulatory Visit (INDEPENDENT_AMBULATORY_CARE_PROVIDER_SITE_OTHER): Payer: Medicare Other | Admitting: Internal Medicine

## 2019-01-02 DIAGNOSIS — F329 Major depressive disorder, single episode, unspecified: Secondary | ICD-10-CM | POA: Diagnosis not present

## 2019-01-02 DIAGNOSIS — F411 Generalized anxiety disorder: Secondary | ICD-10-CM | POA: Diagnosis not present

## 2019-01-02 DIAGNOSIS — F32A Depression, unspecified: Secondary | ICD-10-CM

## 2019-01-02 MED ORDER — SERTRALINE HCL 100 MG PO TABS: 200.0000 mg | ORAL_TABLET | Freq: Every day | ORAL | 1 refills | Status: DC

## 2019-01-02 NOTE — Assessment & Plan Note (Signed)
Chronic Sertaline 200 mg/d to continue

## 2019-01-02 NOTE — Assessment & Plan Note (Signed)
Sertaline 200 mg/d

## 2019-01-02 NOTE — Progress Notes (Signed)
Virtual Visit via Video Note  I connected with Harle Stanford on 01/02/19 at  2:00 PM EDT by a video enabled telemedicine application and verified that I am speaking with the correct person using two identifiers.   I discussed the limitations of evaluation and management by telemedicine and the availability of in person appointments. The patient expressed understanding and agreed to proceed.  History of Present Illness: We need to follow-up on anxiety, depression. Te pt needs Sertaline 200 mg/d refilled  There has been doing well  Observations/Objective: The patient appears to be in no acute distress, looks well. Very pleasant  Assessment and Plan:  See my Assessment and Plan. Follow Up Instructions:    I discussed the assessment and treatment plan with the patient. The patient was provided an opportunity to ask questions and all were answered. The patient agreed with the plan and demonstrated an understanding of the instructions.   The patient was advised to call back or seek an in-person evaluation if the symptoms worsen or if the condition fails to improve as anticipated.  I provided face-to-face time during this encounter. We were at different locations.   Walker Kehr, MD

## 2019-01-07 ENCOUNTER — Encounter: Payer: Self-pay | Admitting: Internal Medicine

## 2019-01-08 MED ORDER — CLONAZEPAM 1 MG PO TABS: 1.0000 mg | ORAL_TABLET | Freq: Two times a day (BID) | ORAL | 2 refills | Status: DC | PRN

## 2019-01-08 NOTE — Telephone Encounter (Signed)
Staff to assist pt for in person OV if ok with pt

## 2019-01-08 NOTE — Telephone Encounter (Signed)
Done erx 

## 2019-01-13 ENCOUNTER — Other Ambulatory Visit: Payer: Medicare Other

## 2019-01-13 ENCOUNTER — Encounter: Payer: Self-pay | Admitting: Internal Medicine

## 2019-01-13 ENCOUNTER — Ambulatory Visit (INDEPENDENT_AMBULATORY_CARE_PROVIDER_SITE_OTHER): Payer: Medicare Other | Admitting: Internal Medicine

## 2019-01-13 ENCOUNTER — Other Ambulatory Visit: Payer: Self-pay

## 2019-01-13 VITALS — BP 118/76 | HR 68 | Ht 66.0 in | Wt 139.0 lb

## 2019-01-13 DIAGNOSIS — I10 Essential (primary) hypertension: Secondary | ICD-10-CM

## 2019-01-13 DIAGNOSIS — L989 Disorder of the skin and subcutaneous tissue, unspecified: Secondary | ICD-10-CM

## 2019-01-13 DIAGNOSIS — E119 Type 2 diabetes mellitus without complications: Secondary | ICD-10-CM | POA: Diagnosis not present

## 2019-01-13 DIAGNOSIS — G4700 Insomnia, unspecified: Secondary | ICD-10-CM | POA: Diagnosis not present

## 2019-01-13 DIAGNOSIS — E559 Vitamin D deficiency, unspecified: Secondary | ICD-10-CM | POA: Diagnosis not present

## 2019-01-13 DIAGNOSIS — E611 Iron deficiency: Secondary | ICD-10-CM

## 2019-01-13 DIAGNOSIS — Z23 Encounter for immunization: Secondary | ICD-10-CM | POA: Diagnosis not present

## 2019-01-13 DIAGNOSIS — Z0001 Encounter for general adult medical examination with abnormal findings: Secondary | ICD-10-CM | POA: Diagnosis not present

## 2019-01-13 DIAGNOSIS — E538 Deficiency of other specified B group vitamins: Secondary | ICD-10-CM | POA: Diagnosis not present

## 2019-01-13 MED ORDER — ESZOPICLONE 2 MG PO TABS: 2.0000 mg | ORAL_TABLET | Freq: Every evening | ORAL | 1 refills | Status: DC | PRN

## 2019-01-13 NOTE — Patient Instructions (Addendum)
You had the flu shot today  Please continue all other medications as before, and refills have been done if requested.  Please have the pharmacy call with any other refills you may need.  Please continue your efforts at being more active, low cholesterol diet, and weight control.  You are otherwise up to date with prevention measures today.  Please keep your appointments with your specialists as you may have planned  You will be contacted regarding the referral for: dermatology  Please go to the LAB in the Basement (turn left off the elevator) for the tests to be done today  You will be contacted by phone if any changes need to be made immediately.  Otherwise, you will receive a letter about your results with an explanation, but please check with MyChart first.  Please remember to sign up for MyChart if you have not done so, as this will be important to you in the future with finding out test results, communicating by private email, and scheduling acute appointments online when needed.  Please return in 6 months, or sooner if needed, with Lab testing done 3-5 days before

## 2019-01-13 NOTE — Progress Notes (Signed)
Subjective:    Patient ID: Crystal Thornton, female    DOB: 1945/04/28, 73 y.o.   MRN: [id number]  HPI  Here for wellness and f/u;  Overall doing ok;  Pt denies Chest pain, worsening SOB, DOE, wheezing, orthopnea, PND, worsening LE edema, palpitations, dizziness or syncope.  Pt denies neurological change such as new headache, facial or extremity weakness.  Pt denies polydipsia, polyuria, or low sugar symptoms. Pt states overall good compliance with treatment and medications, good tolerability, and has been trying to follow appropriate diet.  . No fever, night sweats, wt loss, loss of appetite, or other constitutional symptoms.  Pt states good ability with ADL's, has low fall risk, home safety reviewed and adequate, no other significant changes in hearing or vision, and only occasionally active with exercise.   Does have a skin lesion left arm about 1/2 cm with heaped edges and non healing center for several months, no pain, redness or drainage or trauma. Also has worsening insomnia not currently well controlled for the last 2 months, just cant get to sleep most nights, mind wont turn off, and Denies worsening depressive symptoms, suicidal ideation, or panic; has ongoing anxiety, no change recently per pt. Past Medical History:  Diagnosis Date  . ANGIOEDEMA 01/24/2010  . Anxiety   . ANXIETY 11/07/2006  . BACK PAIN 11/01/2007  . Cough 05/29/2008  . CYST, OVARIAN NEC/NOS 11/05/2006  . Depression   . DEPRESSION 11/07/2006  . Diabetes mellitus   . DIABETES MELLITUS, TYPE II 11/07/2006  . DIVERTICULOSIS, COLON 11/07/2006  . FIBROCYSTIC BREAST DISEASE 11/05/2006  . HEAD TRAUMA, CLOSED 01/24/2010  . HEPATITIS B, HX OF 11/05/2006  . Hepatitis C    Treated and no longer has  . HEPATITIS C 11/05/2006  . Hyperlipidemia   . HYPERLIPIDEMIA 11/07/2006  . Hypertension   . HYPERTENSION 05/29/2007  . INSOMNIA, HX OF 11/05/2006  . LOW BACK PAIN 05/29/2007  . Osteopenia   . OSTEOPENIA 11/07/2006  . Other specified sites  of sprains and strains 08/31/2008  . Pain    R. shoulder and lower back and legs  . PULMONARY NODULE, LEFT UPPER LOBE 05/31/2009  . Seizures ([place])   . SHOULDER PAIN, RIGHT, CHRONIC 06/02/2010  . SINUSITIS- ACUTE-NOS 12/10/2007  . SKIN LESION 06/02/2010  . TOTAL ABDOMINAL HYSTERECTOMY, HX OF 03/27/1990  . Wears dentures    full top  . Wears glasses    Past Surgical History:  Procedure Laterality Date  . APPENDECTOMY    . CERVICAL FUSION  2013  . CESAREAN SECTION    . COLONOSCOPY    . EUS N/A 07/29/2015   Procedure: UPPER ENDOSCOPIC ULTRASOUND (EUS) LINEAR;  Surgeon: Milus Banister, MD;  Location: [facility];  Service: Endoscopy;  Laterality: N/A;  . EXTERNAL FIXATION LEG Left 12/12/2012   Procedure: LEFT ADJUSTMENT OF EXTERNAL FIXATION UNDER ANESTHESIA;  Surgeon: Wylene Simmer, MD;  Location: [facility];  Service: Orthopedics;  Laterality: Left;  . LIVER BIOPSY    . ORIF ANKLE FRACTURE Left 12/03/2012  . ORIF ANKLE FRACTURE Left 01/02/2013   Procedure: LEFT ANKLE REMOVAL OF EXTERNAL FIXATOR OPEN REDUCTION INTERNAL FIXATION (ORIF) TRIMALLEOLAR FRACTURE;  Surgeon: Wylene Simmer, MD;  Location: [facility];  Service: Orthopedics;  Laterality: Left;  . OVARIAN CYST SURGERY     ruptured  . SHOULDER ARTHROSCOPY  2008   left  . TUBAL LIGATION      reports that she has been smoking cigarettes. She has a 12.50  pack-year smoking history. She has never used smokeless tobacco. She reports that she does not drink alcohol or use drugs. family history includes Liver cancer in her mother. Allergies  Allergen Reactions  . Naproxen Sodium Anaphylaxis  . Sulfonamide Derivatives Other (See Comments)    Causes nephritis  . Chantix [Varenicline Tartrate] Other (See Comments)  . Ace Inhibitors Swelling    Lip swelling   Current Outpatient Medications on File Prior to Visit  Medication Sig Dispense Refill  . albuterol (PROVENTIL HFA;VENTOLIN HFA) 108 (90 Base) MCG/ACT inhaler Inhale 2  puffs into the lungs every 6 (six) hours as needed for wheezing or shortness of breath. 3 Inhaler 0  . atenolol (TENORMIN) 50 MG tablet Take 1 tablet (50 mg total) by mouth daily. 90 tablet 3  . Blood Glucose Monitoring Suppl (ONETOUCH VERIO FLEX SYSTEM) w/Device KIT Use as directed daily E11.9 1 kit 0  . clonazePAM (KLONOPIN) 1 MG tablet Take 1 tablet (1 mg total) by mouth 2 (two) times daily as needed. 60 tablet 2  . Fluticasone-Umeclidin-Vilant (TRELEGY ELLIPTA) 100-62.5-25 MCG/INH AEPB Inhale 1 puff into the lungs daily. 60 each 3  . Ginseng 100 MG CAPS Take 100 mg by mouth daily.    Marland Kitchen glucose blood (ONETOUCH VERIO) test strip Use as instructed daily E11.9 100 each 12  . metFORMIN (GLUCOPHAGE) 500 MG tablet TAKE 2 TABLETS BY MOUTH Twice daily 360 tablet 3  . Misc Natural Products (GINKOGIN PO) Take by mouth.    . Multiple Vitamins-Minerals (CENTRUM SILVER PO) Take 1 tablet by mouth daily.      . rosuvastatin (CRESTOR) 40 MG tablet Take 1 tablet (40 mg total) by mouth daily. 90 tablet 3  . sertraline (ZOLOFT) 100 MG tablet Take 2 tablets (200 mg total) by mouth daily. 180 tablet 1   No current facility-administered medications on file prior to visit.    Review of Systems Constitutional: Negative for other unusual diaphoresis, sweats, appetite or weight changes HENT: Negative for other worsening hearing loss, ear pain, facial swelling, mouth sores or neck stiffness.   Eyes: Negative for other worsening pain, redness or other visual disturbance.  Respiratory: Negative for other stridor or swelling Cardiovascular: Negative for other palpitations or other chest pain  Gastrointestinal: Negative for worsening diarrhea or loose stools, blood in stool, distention or other pain Genitourinary: Negative for hematuria, flank pain or other change in urine volume.  Musculoskeletal: Negative for myalgias or other joint swelling.  Skin: Negative for other color change, or other wound or worsening  drainage.  Neurological: Negative for other syncope or numbness. Hematological: Negative for other adenopathy or swelling Psychiatric/Behavioral: Negative for hallucinations, other worsening agitation, SI, self-injury, or new decreased concentration All otherwise neg per pt    Objective:   Physical Exam BP 118/76   Pulse 68   Ht '5\' 6"'$  (1.676 m)   Wt 139 lb (63 kg)   SpO2 95%   BMI 22.44 kg/m  VS noted,  Constitutional: Pt is oriented to person, place, and time. Appears well-developed and well-nourished, in no significant distress and comfortable Head: Normocephalic and atraumatic  Eyes: Conjunctivae and EOM are normal. Pupils are equal, round, and reactive to light Right Ear: External ear normal without discharge Left Ear: External ear normal without discharge Nose: Nose without discharge or deformity Mouth/Throat: Oropharynx is without other ulcerations and moist  Neck: Normal range of motion. Neck supple. No JVD present. No tracheal deviation present or significant neck LA or mass Cardiovascular: Normal rate, regular  rhythm, normal heart sounds and intact distal pulses.   Pulmonary/Chest: WOB normal and breath sounds without rales or wheezing  Abdominal: Soft. Bowel sounds are normal. NT. No HSM  Musculoskeletal: Normal range of motion. Exhibits no edema Lymphadenopathy: Has no other cervical adenopathy.  Neurological: Pt is alert and oriented to person, place, and time. Pt has normal reflexes. No cranial nerve deficit. Motor grossly intact, Gait intact Skin: Skin is warm and dry. No rash noted or new ulcerations, but has left mid post arm with 1/2 cm lesion with heaped edges and nonhealing center Psychiatric:  Has normal mood and affect. Behavior is normal without agitation but 1+ nervous All otherwise neg per pt   Lab Results  Component Value Date   WBC 9.0 08/02/2018   HGB 15.5 (H) 08/02/2018   HCT 45.4 08/02/2018   PLT 244.0 08/02/2018   GLUCOSE 127 (H) 08/02/2018    CHOL 210 (H) 08/02/2018   TRIG 181.0 (H) 08/02/2018   HDL 55.10 08/02/2018   LDLDIRECT 103.0 11/17/2015   LDLCALC 118 (H) 08/02/2018   ALT 15 08/02/2018   AST 16 08/02/2018   NA 144 08/02/2018   K 4.9 08/02/2018   CL 106 08/02/2018   CREATININE 0.73 08/02/2018   BUN 17 08/02/2018   CO2 29 08/02/2018   TSH 0.44 08/02/2018   HGBA1C 7.1 (H) 08/02/2018   MICROALBUR 13.4 (H) 06/29/2017      Assessment & Plan:

## 2019-01-14 ENCOUNTER — Telehealth: Payer: Self-pay

## 2019-01-14 MED ORDER — BELSOMRA 10 MG PO TABS: 10.0000 mg | ORAL_TABLET | Freq: Every evening | ORAL | 5 refills | Status: DC | PRN

## 2019-01-14 NOTE — Telephone Encounter (Signed)
Done erx - belsomra  Please let pt know

## 2019-01-14 NOTE — Telephone Encounter (Signed)
Eszopiclone not covered by insurance.  Must try Ramelteon, Belsomra, or Trazodone first. Please advise

## 2019-01-19 ENCOUNTER — Encounter: Payer: Self-pay | Admitting: Internal Medicine

## 2019-01-19 NOTE — Assessment & Plan Note (Signed)
stable overall by history and exam, recent data reviewed with pt, and pt to continue medical treatment as before,  to f/u any worsening symptoms or concerns  

## 2019-01-19 NOTE — Assessment & Plan Note (Signed)

## 2019-01-19 NOTE — Assessment & Plan Note (Signed)
Mild to mod, for lunesta 2 mg qhs prn,  to f/u any worsening symptoms or concerns

## 2019-01-19 NOTE — Assessment & Plan Note (Addendum)
C/w prob basal cell left mid post arm - for derm referral  In addition to the time spent performing CPE, I spent an additional 25 minutes face to face,in which greater than 50% of this time was spent in counseling and coordination of care for patient's acute illness as documented, including the differential dx, treatment, further evaluation and other management of skin lesion, DM, HTN, insomnia

## 2019-01-26 ENCOUNTER — Other Ambulatory Visit: Payer: Self-pay | Admitting: Emergency Medicine

## 2019-01-29 DIAGNOSIS — M4322 Fusion of spine, cervical region: Secondary | ICD-10-CM | POA: Diagnosis not present

## 2019-01-29 DIAGNOSIS — G894 Chronic pain syndrome: Secondary | ICD-10-CM | POA: Diagnosis not present

## 2019-01-29 DIAGNOSIS — Z5181 Encounter for therapeutic drug level monitoring: Secondary | ICD-10-CM | POA: Diagnosis not present

## 2019-01-29 DIAGNOSIS — M792 Neuralgia and neuritis, unspecified: Secondary | ICD-10-CM | POA: Diagnosis not present

## 2019-01-29 DIAGNOSIS — M791 Myalgia, unspecified site: Secondary | ICD-10-CM | POA: Diagnosis not present

## 2019-01-29 DIAGNOSIS — M533 Sacrococcygeal disorders, not elsewhere classified: Secondary | ICD-10-CM | POA: Diagnosis not present

## 2019-02-05 ENCOUNTER — Other Ambulatory Visit: Payer: Self-pay | Admitting: Emergency Medicine

## 2019-02-07 MED ORDER — TRELEGY ELLIPTA 100-62.5-25 MCG/INH IN AEPB: 1.0000 | INHALATION_SPRAY | Freq: Every day | RESPIRATORY_TRACT | 0 refills | Status: DC

## 2019-02-13 DIAGNOSIS — I8312 Varicose veins of left lower extremity with inflammation: Secondary | ICD-10-CM | POA: Diagnosis not present

## 2019-02-13 DIAGNOSIS — I872 Venous insufficiency (chronic) (peripheral): Secondary | ICD-10-CM | POA: Diagnosis not present

## 2019-02-13 DIAGNOSIS — L281 Prurigo nodularis: Secondary | ICD-10-CM | POA: Diagnosis not present

## 2019-02-13 DIAGNOSIS — D692 Other nonthrombocytopenic purpura: Secondary | ICD-10-CM | POA: Diagnosis not present

## 2019-02-13 DIAGNOSIS — C44629 Squamous cell carcinoma of skin of left upper limb, including shoulder: Secondary | ICD-10-CM | POA: Diagnosis not present

## 2019-02-13 DIAGNOSIS — I8311 Varicose veins of right lower extremity with inflammation: Secondary | ICD-10-CM | POA: Diagnosis not present

## 2019-02-25 ENCOUNTER — Ambulatory Visit: Payer: Medicare Other | Admitting: Emergency Medicine

## 2019-02-25 ENCOUNTER — Encounter: Payer: Self-pay | Admitting: Emergency Medicine

## 2019-02-25 ENCOUNTER — Other Ambulatory Visit: Payer: Self-pay

## 2019-02-25 DIAGNOSIS — F17200 Nicotine dependence, unspecified, uncomplicated: Secondary | ICD-10-CM | POA: Diagnosis not present

## 2019-02-25 DIAGNOSIS — J449 Chronic obstructive pulmonary disease, unspecified: Secondary | ICD-10-CM | POA: Diagnosis not present

## 2019-02-25 MED ORDER — ALBUTEROL SULFATE (2.5 MG/3ML) 0.083% IN NEBU: 2.5000 mg | INHALATION_SOLUTION | Freq: Four times a day (QID) | RESPIRATORY_TRACT | 2 refills | Status: DC | PRN

## 2019-02-25 MED ORDER — TRELEGY ELLIPTA 100-62.5-25 MCG/INH IN AEPB: 1.0000 | INHALATION_SPRAY | Freq: Every day | RESPIRATORY_TRACT | 0 refills | Status: DC

## 2019-02-25 NOTE — Assessment & Plan Note (Signed)
Discussed cessation in detail with her today.  Anxiety is one of her barriers, smoking increases when she is anxious.  Discussed other strategies to deal with anxiety.  She is going to set a goal to get to 5 or fewer per day.  When she accomplishes this then we can talk about trying to set a quit date.

## 2019-02-25 NOTE — Patient Instructions (Addendum)
Please continue Trelegy once daily every day.  Rinse and gargle after using. We will order a nebulizer machine.  You can use albuterol nebulized up to every 4 hours if needed for shortness of breath, chest tightness, wheezing. Keep your albuterol inhaler available to use 2 puffs if you need it for shortness of breath. Flu shot and pneumonia shot are up-to-date You need to work hard on decreasing your cigarettes.  Set a goal of getting down to 5 or less each day.  Once you accomplish this then we can talk about strategies to quit altogether. Follow with Dr Lamonte Sakai in 6 months or sooner if you have any problems

## 2019-02-25 NOTE — Progress Notes (Signed)
Subjective:    Patient ID: Crystal Thornton, female    DOB: 02-May-1945, 73 y.o.   MRN: [id number]  COPD She complains of cough and wheezing. There is no shortness of breath. Pertinent negatives include no ear pain, fever, headaches, postnasal drip, rhinorrhea, sneezing, sore throat or trouble swallowing. Her past medical history is significant for COPD.    ROV 02/25/2019 --follow-up visit for 73 year old woman with active tobacco use, hypertension, diabetes, hepatitis C, seizures and severe COPD.  Currently managed on trilogy since last year.  She has albuterol which she uses approximately.  She reports today that she has smoked as few as 5 cig a day, now up to 10 a day. She feels that the Trelegy has been beneficial  - less dyspnea. She has daily cough, yellow mucous. She uses albuterol HFA about 2x a day. She has borrowed nebulized albuterol before and has benefited. Flu shot up to date. No flares since last time.      Review of Systems  Constitutional: Negative for fever and unexpected weight change.  HENT: Negative for congestion, dental problem, ear pain, nosebleeds, postnasal drip, rhinorrhea, sinus pressure, sneezing, sore throat and trouble swallowing.   Eyes: Negative for redness and itching.  Respiratory: Positive for cough and wheezing. Negative for chest tightness and shortness of breath.   Cardiovascular: Negative for palpitations and leg swelling.  Gastrointestinal: Negative for nausea and vomiting.  Genitourinary: Negative for dysuria.  Musculoskeletal: Negative for joint swelling.  Skin: Negative for rash.  Neurological: Negative for headaches.  Hematological: Does not bruise/bleed easily.  Psychiatric/Behavioral: Negative for dysphoric mood. The patient is not nervous/anxious.     Past Medical History:  Diagnosis Date  . ANGIOEDEMA 01/24/2010  . Anxiety   . ANXIETY 11/07/2006  . BACK PAIN 11/01/2007  . Cough 05/29/2008  . CYST, OVARIAN NEC/NOS 11/05/2006  . Depression    . DEPRESSION 11/07/2006  . Diabetes mellitus   . DIABETES MELLITUS, TYPE II 11/07/2006  . DIVERTICULOSIS, COLON 11/07/2006  . FIBROCYSTIC BREAST DISEASE 11/05/2006  . HEAD TRAUMA, CLOSED 01/24/2010  . HEPATITIS B, HX OF 11/05/2006  . Hepatitis C    Treated and no longer has  . HEPATITIS C 11/05/2006  . Hyperlipidemia   . HYPERLIPIDEMIA 11/07/2006  . Hypertension   . HYPERTENSION 05/29/2007  . INSOMNIA, HX OF 11/05/2006  . LOW BACK PAIN 05/29/2007  . Osteopenia   . OSTEOPENIA 11/07/2006  . Other specified sites of sprains and strains 08/31/2008  . Pain    R. shoulder and lower back and legs  . PULMONARY NODULE, LEFT UPPER LOBE 05/31/2009  . Seizures ([place])   . SHOULDER PAIN, RIGHT, CHRONIC 06/02/2010  . SINUSITIS- ACUTE-NOS 12/10/2007  . SKIN LESION 06/02/2010  . TOTAL ABDOMINAL HYSTERECTOMY, HX OF 03/27/1990  . Wears dentures    full top  . Wears glasses      Family History  Problem Relation Age of Onset  . Liver cancer Mother   . Breast cancer Neg Hx      Social History   Socioeconomic History  . Marital status: Divorced    Spouse name: Not on file  . Number of children: Not on file  . Years of education: Not on file  . Highest education level: Not on file  Occupational History  . Not on file  Social Needs  . Financial resource strain: Not on file  . Food insecurity    Worry: Not on file    Inability: Not on file  .  Transportation needs    Medical: Not on file    Non-medical: Not on file  Tobacco Use  . Smoking status: Current Every Day Smoker    Packs/day: 0.25    Years: 50.00    Pack years: 12.50    Types: Cigarettes  . Smokeless tobacco: Never Used  Substance and Sexual Activity  . Alcohol use: No  . Drug use: No  . Sexual activity: Not on file    Comment: trying to cut down  Lifestyle  . Physical activity    Days per week: Not on file    Minutes per session: Not on file  . Stress: Not on file  Relationships  . Social Herbalist on phone: Not on file     Gets together: Not on file    Attends religious service: Not on file    Active member of club or organization: Not on file    Attends meetings of clubs or organizations: Not on file    Relationship status: Not on file  . Intimate partner violence    Fear of current or ex partner: Not on file    Emotionally abused: Not on file    Physically abused: Not on file    Forced sexual activity: Not on file  Other Topics Concern  . Not on file  Social History Narrative  . Not on file  She worked as a Radio broadcast assistant, office job Lived in [place], [place], [place]  Allergies  Allergen Reactions  . Naproxen Sodium Anaphylaxis  . Sulfonamide Derivatives Other (See Comments)    Causes nephritis  . Chantix [Varenicline Tartrate] Other (See Comments)  . Ace Inhibitors Swelling    Lip swelling     Outpatient Medications Prior to Visit  Medication Sig Dispense Refill  . albuterol (PROVENTIL HFA;VENTOLIN HFA) 108 (90 Base) MCG/ACT inhaler Inhale 2 puffs into the lungs every 6 (six) hours as needed for wheezing or shortness of breath. 3 Inhaler 0  . atenolol (TENORMIN) 50 MG tablet Take 1 tablet (50 mg total) by mouth daily. 90 tablet 3  . Blood Glucose Monitoring Suppl (ONETOUCH VERIO FLEX SYSTEM) w/Device KIT Use as directed daily E11.9 1 kit 0  . clonazePAM (KLONOPIN) 1 MG tablet Take 1 tablet (1 mg total) by mouth 2 (two) times daily as needed. 60 tablet 2  . eszopiclone (LUNESTA) 2 MG TABS tablet Take 1 tablet (2 mg total) by mouth at bedtime as needed for sleep. Take immediately before bedtime 90 tablet 1  . Fluticasone-Umeclidin-Vilant (TRELEGY ELLIPTA) 100-62.5-25 MCG/INH AEPB Inhale 1 puff into the lungs daily. 60 each 0  . Ginseng 100 MG CAPS Take 100 mg by mouth daily.    Marland Kitchen glucose blood (ONETOUCH VERIO) test strip Use as instructed daily E11.9 100 each 12  . metFORMIN (GLUCOPHAGE) 500 MG tablet TAKE 2 TABLETS BY MOUTH Twice daily 360 tablet 3  . Misc Natural Products (GINKOGIN PO) Take by mouth.    .  Multiple Vitamins-Minerals (CENTRUM SILVER PO) Take 1 tablet by mouth daily.      . rosuvastatin (CRESTOR) 40 MG tablet Take 1 tablet (40 mg total) by mouth daily. 90 tablet 3  . sertraline (ZOLOFT) 100 MG tablet Take 2 tablets (200 mg total) by mouth daily. 180 tablet 1  . Suvorexant (BELSOMRA) 10 MG TABS Take 10 mg by mouth at bedtime as needed. 30 tablet 5   No facility-administered medications prior to visit.         Objective:  Physical Exam  Vitals:   02/25/19 1428  BP: (!) 164/90  Pulse: 61  Temp: (!) 97.1 F (36.2 C)  TempSrc: Oral  SpO2: 93%  Weight: 135 lb (61.2 kg)  Height: '5\' 6"'$  (1.676 m)   Gen: Pleasant, Thin woman, in no distress,  normal affect  ENT: No lesions,  mouth clear,  oropharynx clear, no postnasal drip, somewhat hoarse voice  Neck: No JVD, no stridor  Lungs: No use of accessory muscles, distant, some scattered inspiratory crackles, no wheezing  Cardiovascular: RRR, heart sounds normal, no murmur or gallops, no peripheral edema  Musculoskeletal: No deformities, no cyanosis or clubbing  Neuro: alert, non focal  Skin: Warm, no lesions or rashes     Assessment & Plan:  COPD (chronic obstructive pulmonary disease) (HCC) Overall stable. No flares since last time. She wants to try nebulized version of albuterol - will order for her. Needs a sample of Trelegy  Please continue Trelegy once daily every day.  Rinse and gargle after using. We will order a nebulizer machine.  You can use albuterol nebulized up to every 4 hours if needed for shortness of breath, chest tightness, wheezing. Keep your albuterol inhaler available to use 2 puffs if you need it for shortness of breath. Flu shot and pneumonia shot are up-to-date Follow with Dr Lamonte Sakai in 6 months or sooner if you have any problems  Smoker Discussed cessation in detail with her today.  Anxiety is one of her barriers, smoking increases when she is anxious.  Discussed other strategies to deal with  anxiety.  She is going to set a goal to get to 5 or fewer per day.  When she accomplishes this then we can talk about trying to set a quit date.  Baltazar Apo, MD, PhD 02/25/2019, 2:48 PM [facility] [phone] or if no answer [phone]

## 2019-02-25 NOTE — Assessment & Plan Note (Signed)
Overall stable. No flares since last time. She wants to try nebulized version of albuterol - will order for her. Needs a sample of Trelegy  Please continue Trelegy once daily every day.  Rinse and gargle after using. We will order a nebulizer machine.  You can use albuterol nebulized up to every 4 hours if needed for shortness of breath, chest tightness, wheezing. Keep your albuterol inhaler available to use 2 puffs if you need it for shortness of breath. Flu shot and pneumonia shot are up-to-date Follow with Dr Lamonte Sakai in 6 months or sooner if you have any problems

## 2019-02-25 NOTE — Addendum Note (Signed)
Addended by: Jannette Spanner on: 02/25/2019 03:27 PM   Modules accepted: Orders

## 2019-02-26 DIAGNOSIS — G894 Chronic pain syndrome: Secondary | ICD-10-CM | POA: Diagnosis not present

## 2019-03-03 DIAGNOSIS — L281 Prurigo nodularis: Secondary | ICD-10-CM | POA: Diagnosis not present

## 2019-03-03 DIAGNOSIS — C44629 Squamous cell carcinoma of skin of left upper limb, including shoulder: Secondary | ICD-10-CM | POA: Diagnosis not present

## 2019-03-05 DIAGNOSIS — J449 Chronic obstructive pulmonary disease, unspecified: Secondary | ICD-10-CM | POA: Diagnosis not present

## 2019-03-05 DIAGNOSIS — R05 Cough: Secondary | ICD-10-CM | POA: Diagnosis not present

## 2019-03-05 DIAGNOSIS — R911 Solitary pulmonary nodule: Secondary | ICD-10-CM | POA: Diagnosis not present

## 2019-03-05 DIAGNOSIS — R55 Syncope and collapse: Secondary | ICD-10-CM | POA: Diagnosis not present

## 2019-03-05 DIAGNOSIS — G894 Chronic pain syndrome: Secondary | ICD-10-CM | POA: Diagnosis not present

## 2019-03-14 DIAGNOSIS — E119 Type 2 diabetes mellitus without complications: Secondary | ICD-10-CM | POA: Diagnosis not present

## 2019-04-02 DIAGNOSIS — G8929 Other chronic pain: Secondary | ICD-10-CM | POA: Diagnosis not present

## 2019-04-02 DIAGNOSIS — M4322 Fusion of spine, cervical region: Secondary | ICD-10-CM | POA: Diagnosis not present

## 2019-04-02 DIAGNOSIS — G894 Chronic pain syndrome: Secondary | ICD-10-CM | POA: Diagnosis not present

## 2019-04-02 DIAGNOSIS — S8292XE Unspecified fracture of left lower leg, subsequent encounter for open fracture type I or II with routine healing: Secondary | ICD-10-CM | POA: Diagnosis not present

## 2019-04-02 DIAGNOSIS — M792 Neuralgia and neuritis, unspecified: Secondary | ICD-10-CM | POA: Diagnosis not present

## 2019-04-02 DIAGNOSIS — M533 Sacrococcygeal disorders, not elsewhere classified: Secondary | ICD-10-CM | POA: Diagnosis not present

## 2019-04-08 DIAGNOSIS — G894 Chronic pain syndrome: Secondary | ICD-10-CM | POA: Diagnosis not present

## 2019-04-09 ENCOUNTER — Encounter: Payer: Self-pay | Admitting: Internal Medicine

## 2019-04-10 ENCOUNTER — Other Ambulatory Visit: Payer: Self-pay | Admitting: Internal Medicine

## 2019-04-10 NOTE — Telephone Encounter (Signed)
Done hardcopy to staff 

## 2019-04-10 NOTE — Telephone Encounter (Signed)
Mailed letter to address on file.Marland KitchenJohny Thornton

## 2019-04-17 MED ORDER — TRELEGY ELLIPTA 100-62.5-25 MCG/INH IN AEPB: 1.0000 | INHALATION_SPRAY | Freq: Every day | RESPIRATORY_TRACT | 11 refills | Status: DC

## 2019-05-06 ENCOUNTER — Encounter: Payer: Self-pay | Admitting: Internal Medicine

## 2019-05-06 DIAGNOSIS — G894 Chronic pain syndrome: Secondary | ICD-10-CM | POA: Diagnosis not present

## 2019-05-06 DIAGNOSIS — S8292XE Unspecified fracture of left lower leg, subsequent encounter for open fracture type I or II with routine healing: Secondary | ICD-10-CM | POA: Diagnosis not present

## 2019-05-06 NOTE — Telephone Encounter (Signed)
Staff to fax or mail letter to pt pleaes

## 2019-05-22 ENCOUNTER — Other Ambulatory Visit: Payer: Self-pay | Admitting: Internal Medicine

## 2019-05-22 DIAGNOSIS — I10 Essential (primary) hypertension: Secondary | ICD-10-CM

## 2019-06-03 DIAGNOSIS — S8292XE Unspecified fracture of left lower leg, subsequent encounter for open fracture type I or II with routine healing: Secondary | ICD-10-CM | POA: Diagnosis not present

## 2019-06-03 DIAGNOSIS — G894 Chronic pain syndrome: Secondary | ICD-10-CM | POA: Diagnosis not present

## 2019-06-11 DIAGNOSIS — G894 Chronic pain syndrome: Secondary | ICD-10-CM | POA: Diagnosis not present

## 2019-06-11 DIAGNOSIS — M533 Sacrococcygeal disorders, not elsewhere classified: Secondary | ICD-10-CM | POA: Diagnosis not present

## 2019-06-11 DIAGNOSIS — M792 Neuralgia and neuritis, unspecified: Secondary | ICD-10-CM | POA: Diagnosis not present

## 2019-06-11 DIAGNOSIS — S8292XE Unspecified fracture of left lower leg, subsequent encounter for open fracture type I or II with routine healing: Secondary | ICD-10-CM | POA: Diagnosis not present

## 2019-06-13 DIAGNOSIS — G894 Chronic pain syndrome: Secondary | ICD-10-CM | POA: Diagnosis not present

## 2019-06-13 DIAGNOSIS — S8292XE Unspecified fracture of left lower leg, subsequent encounter for open fracture type I or II with routine healing: Secondary | ICD-10-CM | POA: Diagnosis not present

## 2019-06-26 ENCOUNTER — Other Ambulatory Visit: Payer: Self-pay

## 2019-06-26 ENCOUNTER — Encounter (HOSPITAL_COMMUNITY): Payer: Self-pay

## 2019-06-26 ENCOUNTER — Emergency Department (HOSPITAL_COMMUNITY): Payer: Medicare Other

## 2019-06-26 ENCOUNTER — Inpatient Hospital Stay (HOSPITAL_COMMUNITY)
Admission: EM | Admit: 2019-06-26 | Discharge: 2019-06-28 | DRG: 193 | Disposition: A | Discharge status: Home / Self Care | Payer: Medicare Other | Attending: Internal Medicine | Admitting: Internal Medicine

## 2019-06-26 DIAGNOSIS — B1710 Acute hepatitis C without hepatic coma: Secondary | ICD-10-CM | POA: Diagnosis present

## 2019-06-26 DIAGNOSIS — E1122 Type 2 diabetes mellitus with diabetic chronic kidney disease: Secondary | ICD-10-CM | POA: Diagnosis not present

## 2019-06-26 DIAGNOSIS — Z20822 Contact with and (suspected) exposure to covid-19: Secondary | ICD-10-CM | POA: Diagnosis not present

## 2019-06-26 DIAGNOSIS — I10 Essential (primary) hypertension: Secondary | ICD-10-CM

## 2019-06-26 DIAGNOSIS — J441 Chronic obstructive pulmonary disease with (acute) exacerbation: Secondary | ICD-10-CM | POA: Diagnosis not present

## 2019-06-26 DIAGNOSIS — Z800 Family history of malignant neoplasm of digestive organs: Secondary | ICD-10-CM | POA: Diagnosis not present

## 2019-06-26 DIAGNOSIS — Z803 Family history of malignant neoplasm of breast: Secondary | ICD-10-CM

## 2019-06-26 DIAGNOSIS — J189 Pneumonia, unspecified organism: Secondary | ICD-10-CM | POA: Diagnosis present

## 2019-06-26 DIAGNOSIS — I4892 Unspecified atrial flutter: Secondary | ICD-10-CM | POA: Diagnosis not present

## 2019-06-26 DIAGNOSIS — R05 Cough: Secondary | ICD-10-CM | POA: Diagnosis present

## 2019-06-26 DIAGNOSIS — K5790 Diverticulosis of intestine, part unspecified, without perforation or abscess without bleeding: Secondary | ICD-10-CM | POA: Diagnosis present

## 2019-06-26 DIAGNOSIS — E1159 Type 2 diabetes mellitus with other circulatory complications: Secondary | ICD-10-CM | POA: Diagnosis present

## 2019-06-26 DIAGNOSIS — J159 Unspecified bacterial pneumonia: Secondary | ICD-10-CM | POA: Diagnosis not present

## 2019-06-26 DIAGNOSIS — I129 Hypertensive chronic kidney disease with stage 1 through stage 4 chronic kidney disease, or unspecified chronic kidney disease: Secondary | ICD-10-CM | POA: Diagnosis present

## 2019-06-26 DIAGNOSIS — IMO0002 Reserved for concepts with insufficient information to code with codable children: Secondary | ICD-10-CM | POA: Diagnosis present

## 2019-06-26 DIAGNOSIS — Z794 Long term (current) use of insulin: Secondary | ICD-10-CM

## 2019-06-26 DIAGNOSIS — Z8261 Family history of arthritis: Secondary | ICD-10-CM | POA: Diagnosis not present

## 2019-06-26 DIAGNOSIS — Z66 Do not resuscitate: Secondary | ICD-10-CM | POA: Diagnosis present

## 2019-06-26 DIAGNOSIS — Z7951 Long term (current) use of inhaled steroids: Secondary | ICD-10-CM

## 2019-06-26 DIAGNOSIS — F17210 Nicotine dependence, cigarettes, uncomplicated: Secondary | ICD-10-CM | POA: Diagnosis present

## 2019-06-26 DIAGNOSIS — N181 Chronic kidney disease, stage 1: Secondary | ICD-10-CM | POA: Diagnosis present

## 2019-06-26 DIAGNOSIS — E875 Hyperkalemia: Secondary | ICD-10-CM | POA: Diagnosis present

## 2019-06-26 DIAGNOSIS — I4510 Unspecified right bundle-branch block: Secondary | ICD-10-CM | POA: Diagnosis not present

## 2019-06-26 DIAGNOSIS — G894 Chronic pain syndrome: Secondary | ICD-10-CM | POA: Diagnosis not present

## 2019-06-26 DIAGNOSIS — Z951 Presence of aortocoronary bypass graft: Secondary | ICD-10-CM

## 2019-06-26 DIAGNOSIS — J9601 Acute respiratory failure with hypoxia: Secondary | ICD-10-CM | POA: Diagnosis not present

## 2019-06-26 DIAGNOSIS — Z91040 Latex allergy status: Secondary | ICD-10-CM

## 2019-06-26 DIAGNOSIS — F17200 Nicotine dependence, unspecified, uncomplicated: Secondary | ICD-10-CM

## 2019-06-26 DIAGNOSIS — Z981 Arthrodesis status: Secondary | ICD-10-CM

## 2019-06-26 DIAGNOSIS — R0602 Shortness of breath: Secondary | ICD-10-CM | POA: Diagnosis not present

## 2019-06-26 DIAGNOSIS — J9602 Acute respiratory failure with hypercapnia: Secondary | ICD-10-CM | POA: Diagnosis not present

## 2019-06-26 DIAGNOSIS — J439 Emphysema, unspecified: Secondary | ICD-10-CM

## 2019-06-26 DIAGNOSIS — Z8249 Family history of ischemic heart disease and other diseases of the circulatory system: Secondary | ICD-10-CM

## 2019-06-26 DIAGNOSIS — R0902 Hypoxemia: Secondary | ICD-10-CM | POA: Diagnosis not present

## 2019-06-26 DIAGNOSIS — Z887 Allergy status to serum and vaccine status: Secondary | ICD-10-CM

## 2019-06-26 DIAGNOSIS — K219 Gastro-esophageal reflux disease without esophagitis: Secondary | ICD-10-CM | POA: Diagnosis not present

## 2019-06-26 DIAGNOSIS — R059 Cough, unspecified: Secondary | ICD-10-CM | POA: Diagnosis present

## 2019-06-26 DIAGNOSIS — R112 Nausea with vomiting, unspecified: Secondary | ICD-10-CM | POA: Diagnosis not present

## 2019-06-26 DIAGNOSIS — E1165 Type 2 diabetes mellitus with hyperglycemia: Secondary | ICD-10-CM

## 2019-06-26 DIAGNOSIS — B1920 Unspecified viral hepatitis C without hepatic coma: Secondary | ICD-10-CM | POA: Diagnosis present

## 2019-06-26 DIAGNOSIS — J440 Chronic obstructive pulmonary disease with acute lower respiratory infection: Secondary | ICD-10-CM | POA: Diagnosis not present

## 2019-06-26 DIAGNOSIS — Z91018 Allergy to other foods: Secondary | ICD-10-CM

## 2019-06-26 DIAGNOSIS — F329 Major depressive disorder, single episode, unspecified: Secondary | ICD-10-CM | POA: Diagnosis present

## 2019-06-26 DIAGNOSIS — Z801 Family history of malignant neoplasm of trachea, bronchus and lung: Secondary | ICD-10-CM

## 2019-06-26 DIAGNOSIS — Z79899 Other long term (current) drug therapy: Secondary | ICD-10-CM

## 2019-06-26 DIAGNOSIS — J449 Chronic obstructive pulmonary disease, unspecified: Secondary | ICD-10-CM | POA: Diagnosis present

## 2019-06-26 DIAGNOSIS — E785 Hyperlipidemia, unspecified: Secondary | ICD-10-CM | POA: Diagnosis not present

## 2019-06-26 DIAGNOSIS — Z885 Allergy status to narcotic agent status: Secondary | ICD-10-CM

## 2019-06-26 LAB — GLUCOSE, CAPILLARY: Glucose-Capillary: 156 mg/dL — ABNORMAL HIGH (ref 70–99)

## 2019-06-26 LAB — BASIC METABOLIC PANEL
Anion gap: 8 (ref 5–15)
BUN: 19 mg/dL (ref 8–23)
CO2: 26 mmol/L (ref 22–32)
Calcium: 8.6 mg/dL — ABNORMAL LOW (ref 8.9–10.3)
Chloride: 101 mmol/L (ref 98–111)
Creatinine, Ser: 0.72 mg/dL (ref 0.44–1.00)
GFR calc Af Amer: >60 mL/min (ref >60)
GFR calc non Af Amer: >60 mL/min (ref >60)
Glucose, Bld: 216 mg/dL — ABNORMAL HIGH (ref 70–99)
Potassium: 5 mmol/L (ref 3.5–5.1)
Sodium: 135 mmol/L (ref 135–145)

## 2019-06-26 LAB — CBC WITH DIFFERENTIAL/PLATELET
Abs Immature Granulocytes: 0.11 10*3/uL — ABNORMAL HIGH (ref 0.00–0.07)
Basophils Absolute: 0 10*3/uL (ref 0.0–0.1)
Basophils Relative: 0 %
Eosinophils Absolute: 0 10*3/uL (ref 0.0–0.5)
Eosinophils Relative: 0 %
HCT: 42.2 % (ref 36.0–46.0)
Hemoglobin: 13.9 g/dL (ref 12.0–15.0)
Immature Granulocytes: 1 %
Lymphocytes Relative: 2 %
Lymphs Abs: 0.3 10*3/uL — ABNORMAL LOW (ref 0.7–4.0)
MCH: 31.7 pg (ref 26.0–34.0)
MCHC: 32.9 g/dL (ref 30.0–36.0)
MCV: 96.1 fL (ref 80.0–100.0)
Monocytes Absolute: 0.7 10*3/uL (ref 0.1–1.0)
Monocytes Relative: 4 %
Neutro Abs: 15.8 10*3/uL — ABNORMAL HIGH (ref 1.7–7.7)
Neutrophils Relative %: 93 %
Platelets: 200 10*3/uL (ref 150–400)
RBC: 4.39 MIL/uL (ref 3.87–5.11)
RDW: 13.9 % (ref 11.5–15.5)
WBC: 16.9 10*3/uL — ABNORMAL HIGH (ref 4.0–10.5)
nRBC: 0 % (ref 0.0–0.2)

## 2019-06-26 LAB — RESPIRATORY PANEL BY RT PCR (FLU A&B, COVID)
Influenza A by PCR: NEGATIVE
Influenza B by PCR: NEGATIVE
SARS Coronavirus 2 by RT PCR: NEGATIVE

## 2019-06-26 LAB — BRAIN NATRIURETIC PEPTIDE: B Natriuretic Peptide: 308.2 pg/mL — ABNORMAL HIGH (ref 0.0–100.0)

## 2019-06-26 LAB — TROPONIN I (HIGH SENSITIVITY)
Troponin I (High Sensitivity): 39 ng/L — ABNORMAL HIGH (ref <18)
Troponin I (High Sensitivity): 59 ng/L — ABNORMAL HIGH (ref <18)

## 2019-06-26 LAB — CBG MONITORING, ED: Glucose-Capillary: 207 mg/dL — ABNORMAL HIGH (ref 70–99)

## 2019-06-26 LAB — POC SARS CORONAVIRUS 2 AG -  ED: SARS Coronavirus 2 Ag: NEGATIVE

## 2019-06-26 MED ORDER — INSULIN ASPART 100 UNIT/ML ~~LOC~~ SOLN
0.0000 [IU] | Freq: Every day | SUBCUTANEOUS | Status: DC
  Filled 2019-06-26: qty 0.05

## 2019-06-26 MED ORDER — OXYCODONE HCL 5 MG PO TABS
30.0000 mg | ORAL_TABLET | Freq: Four times a day (QID) | ORAL | Status: DC | PRN
  Administered 2019-06-26 – 2019-06-28 (×6): 30 mg via ORAL
  Filled 2019-06-26 (×6): qty 6

## 2019-06-26 MED ORDER — FLUTICASONE-UMECLIDIN-VILANT 100-62.5-25 MCG/INH IN AEPB: 1.0000 | INHALATION_SPRAY | Freq: Every day | RESPIRATORY_TRACT | Status: DC

## 2019-06-26 MED ORDER — ROSUVASTATIN CALCIUM 10 MG PO TABS
40.0000 mg | ORAL_TABLET | Freq: Every day | ORAL | Status: DC
  Administered 2019-06-26 – 2019-06-28 (×3): 40 mg via ORAL
  Filled 2019-06-26: qty 4
  Filled 2019-06-26: qty 2
  Filled 2019-06-26 (×2): qty 4

## 2019-06-26 MED ORDER — FLUTICASONE FUROATE-VILANTEROL 100-25 MCG/INH IN AEPB
1.0000 | INHALATION_SPRAY | Freq: Every day | RESPIRATORY_TRACT | Status: DC
  Administered 2019-06-27 – 2019-06-28 (×2): 1 via RESPIRATORY_TRACT
  Filled 2019-06-26: qty 28

## 2019-06-26 MED ORDER — ENOXAPARIN SODIUM 40 MG/0.4ML ~~LOC~~ SOLN
40.0000 mg | SUBCUTANEOUS | Status: DC
  Administered 2019-06-26 – 2019-06-27 (×2): 40 mg via SUBCUTANEOUS
  Filled 2019-06-26 (×2): qty 0.4

## 2019-06-26 MED ORDER — SODIUM CHLORIDE 0.45 % IV SOLN: INTRAVENOUS | Status: DC

## 2019-06-26 MED ORDER — ALBUTEROL SULFATE (2.5 MG/3ML) 0.083% IN NEBU: 2.5000 mg | INHALATION_SOLUTION | Freq: Four times a day (QID) | RESPIRATORY_TRACT | Status: DC | PRN

## 2019-06-26 MED ORDER — ALBUTEROL SULFATE HFA 108 (90 BASE) MCG/ACT IN AERS
2.0000 | INHALATION_SPRAY | Freq: Four times a day (QID) | RESPIRATORY_TRACT | Status: DC | PRN
  Filled 2019-06-26: qty 6.7

## 2019-06-26 MED ORDER — SODIUM CHLORIDE 0.9 % IV SOLN
2.0000 g | INTRAVENOUS | Status: DC
  Administered 2019-06-27: 2 g via INTRAVENOUS
  Filled 2019-06-26: qty 20
  Filled 2019-06-26: qty 2

## 2019-06-26 MED ORDER — ALBUTEROL SULFATE HFA 108 (90 BASE) MCG/ACT IN AERS
1.0000 | INHALATION_SPRAY | Freq: Once | RESPIRATORY_TRACT | Status: AC
  Administered 2019-06-26: 2 via RESPIRATORY_TRACT
  Filled 2019-06-26: qty 6.7

## 2019-06-26 MED ORDER — ATENOLOL 25 MG PO TABS
50.0000 mg | ORAL_TABLET | Freq: Every day | ORAL | Status: DC
  Administered 2019-06-26 – 2019-06-28 (×3): 50 mg via ORAL
  Filled 2019-06-26: qty 1
  Filled 2019-06-26 (×3): qty 2

## 2019-06-26 MED ORDER — SODIUM CHLORIDE 0.9 % IV SOLN
500.0000 mg | Freq: Once | INTRAVENOUS | Status: AC
  Administered 2019-06-26: 500 mg via INTRAVENOUS
  Filled 2019-06-26: qty 500

## 2019-06-26 MED ORDER — SODIUM CHLORIDE 0.9 % IV SOLN
1.0000 g | Freq: Once | INTRAVENOUS | Status: AC
  Administered 2019-06-26: 1 g via INTRAVENOUS
  Filled 2019-06-26: qty 10

## 2019-06-26 MED ORDER — INSULIN ASPART 100 UNIT/ML ~~LOC~~ SOLN
0.0000 [IU] | Freq: Three times a day (TID) | SUBCUTANEOUS | Status: DC
  Administered 2019-06-26: 3 [IU] via SUBCUTANEOUS
  Administered 2019-06-27: 1 [IU] via SUBCUTANEOUS
  Administered 2019-06-27: 2 [IU] via SUBCUTANEOUS
  Filled 2019-06-26: qty 0.09

## 2019-06-26 MED ORDER — UMECLIDINIUM BROMIDE 62.5 MCG/INH IN AEPB
1.0000 | INHALATION_SPRAY | Freq: Every day | RESPIRATORY_TRACT | Status: DC
  Administered 2019-06-27 – 2019-06-28 (×2): 1 via RESPIRATORY_TRACT
  Filled 2019-06-26: qty 7

## 2019-06-26 MED ORDER — SODIUM CHLORIDE 0.9 % IV SOLN
500.0000 mg | INTRAVENOUS | Status: DC
  Administered 2019-06-27: 500 mg via INTRAVENOUS
  Filled 2019-06-26 (×3): qty 500

## 2019-06-26 MED ORDER — SERTRALINE HCL 100 MG PO TABS
200.0000 mg | ORAL_TABLET | Freq: Every day | ORAL | Status: DC
  Administered 2019-06-26 – 2019-06-28 (×3): 200 mg via ORAL
  Filled 2019-06-26 (×3): qty 2

## 2019-06-26 NOTE — ED Triage Notes (Signed)
Per EMS, COPD exacerbation-98% on 10 L- non-rebreather-22g L hand-125 mg of Solumedrol-Zofran 4 mg given in route

## 2019-06-26 NOTE — ED Notes (Signed)
Pt provided with meal tray.

## 2019-06-26 NOTE — ED Notes (Signed)
Pt's son, Toniann Ket [phone], would like to be called with updates.

## 2019-06-26 NOTE — ED Notes (Signed)
NT transported pt and all belongings upstairs

## 2019-06-26 NOTE — ED Notes (Signed)
Pt reports she has had both COVID vaccines and is denying all complaints.

## 2019-06-26 NOTE — ED Provider Notes (Signed)
Crystal Thornton Provider Note   CSN: [id number] Arrival date & time: 06/26/19  3570     History Chief Complaint  Patient presents with  . Shortness of Breath    Crystal Thornton is a 74 y.o. female history of diabetes, hepatitis, hyperlipidemia, hypertension, COPD.  Patient presents today for shortness of breath onset after she woke up this morning, describes difficulty catching her breath, no associated pain.  Describes fatigue as well secondary to her shortness of breath.  She denies similar in the past.  She called 911 this morning, SPO2 noted to be mid 80s by EMS placed on nonrebreather with improvement.  EMS gave patient 125 mg Solu-Medrol as well as Zofran in route.  Denies fever/chills, headache, chest pain, hemoptysis, abdominal pain, nausea/vomiting, diarrhea, extremity swelling/color change, numbness/tingling, weakness or any additional concerns. HPI     Past Medical History:  Diagnosis Date  . ANGIOEDEMA 01/24/2010  . Anxiety   . ANXIETY 11/07/2006  . BACK PAIN 11/01/2007  . Cough 05/29/2008  . CYST, OVARIAN NEC/NOS 11/05/2006  . Depression   . DEPRESSION 11/07/2006  . Diabetes mellitus   . DIABETES MELLITUS, TYPE II 11/07/2006  . DIVERTICULOSIS, COLON 11/07/2006  . FIBROCYSTIC BREAST DISEASE 11/05/2006  . HEAD TRAUMA, CLOSED 01/24/2010  . HEPATITIS B, HX OF 11/05/2006  . Hepatitis C    Treated and no longer has  . HEPATITIS C 11/05/2006  . Hyperlipidemia   . HYPERLIPIDEMIA 11/07/2006  . Hypertension   . HYPERTENSION 05/29/2007  . INSOMNIA, HX OF 11/05/2006  . LOW BACK PAIN 05/29/2007  . Osteopenia   . OSTEOPENIA 11/07/2006  . Other specified sites of sprains and strains 08/31/2008  . Pain    R. shoulder and lower back and legs  . PULMONARY NODULE, LEFT UPPER LOBE 05/31/2009  . Seizures ([place])   . SHOULDER PAIN, RIGHT, CHRONIC 06/02/2010  . SINUSITIS- ACUTE-NOS 12/10/2007  . SKIN LESION 06/02/2010  . TOTAL ABDOMINAL HYSTERECTOMY, HX OF 03/27/1990  .  Wears dentures    full top  . Wears glasses     Patient Active Problem List   Diagnosis Date Noted  . Community acquired pneumonia 06/26/2019  . Acute respiratory failure with hypoxia ([place]) 06/26/2019  . CAP (community acquired pneumonia) 06/26/2019  . Syncope 04/24/2017  . RMSF [facility] spotted fever) 12/25/2016  . COPD (chronic obstructive pulmonary disease) ([place]) 11/16/2016  . Rash 11/16/2016  . Choledocholithiasis 06/30/2015  . Urinary urgency 06/24/2015  . Right flank pain 06/24/2015  . RUQ pain 06/24/2015  . Delusions ([place]) 11/03/2014  . Hallucinations 11/03/2014  . Chronic pain syndrome   . Seizure ([place]) 11/01/2014  . Smoker 08/12/2014  . Encounter for well adult exam with abnormal findings 11/02/2010  . Disorder of skin or subcutaneous tissue 06/02/2010  . SHOULDER PAIN, RIGHT, CHRONIC 06/02/2010  . HEAD TRAUMA, CLOSED 01/24/2010  . ANGIOEDEMA 01/24/2010  . Pulmonary nodule, left 05/31/2009  . Other specified sites of sprains and strains 08/31/2008  . Cough 05/29/2008  . BACK PAIN 11/01/2007  . Essential hypertension 05/29/2007  . LOW BACK PAIN 05/29/2007  . Diabetes mellitus type 2, uncontrolled ([place]) 11/07/2006  . Hyperlipidemia 11/07/2006  . Anxiety state 11/07/2006  . Depression 11/07/2006  . DIVERTICULOSIS, COLON 11/07/2006  . OSTEOPENIA 11/07/2006  . HEPATITIS C 11/05/2006  . FIBROCYSTIC BREAST DISEASE 11/05/2006  . CYST, OVARIAN NEC/NOS 11/05/2006  . HEPATITIS B, HX OF 11/05/2006  . Insomnia 11/05/2006  . TOTAL ABDOMINAL HYSTERECTOMY, HX OF 03/27/1990  Past Surgical History:  Procedure Laterality Date  . APPENDECTOMY    . CERVICAL FUSION  2013  . CESAREAN SECTION    . COLONOSCOPY    . EUS N/A 07/29/2015   Procedure: UPPER ENDOSCOPIC ULTRASOUND (EUS) LINEAR;  Surgeon: Milus Banister, MD;  Location: [facility];  Service: Endoscopy;  Laterality: N/A;  . EXTERNAL FIXATION LEG Left 12/12/2012   Procedure: LEFT ADJUSTMENT OF EXTERNAL FIXATION  UNDER ANESTHESIA;  Surgeon: Wylene Simmer, MD;  Location: [facility];  Service: Orthopedics;  Laterality: Left;  . LIVER BIOPSY    . ORIF ANKLE FRACTURE Left 12/03/2012  . ORIF ANKLE FRACTURE Left 01/02/2013   Procedure: LEFT ANKLE REMOVAL OF EXTERNAL FIXATOR OPEN REDUCTION INTERNAL FIXATION (ORIF) TRIMALLEOLAR FRACTURE;  Surgeon: Wylene Simmer, MD;  Location: [facility];  Service: Orthopedics;  Laterality: Left;  . OVARIAN CYST SURGERY     ruptured  . SHOULDER ARTHROSCOPY  2008   left  . TUBAL LIGATION       OB History   No obstetric history on file.     Family History  Problem Relation Age of Onset  . Liver cancer Mother   . Breast cancer Neg Hx     Social History   Tobacco Use  . Smoking status: Current Every Day Smoker    Packs/day: 0.25    Years: 50.00    Pack years: 12.50    Types: Cigarettes  . Smokeless tobacco: Never Used  Substance Use Topics  . Alcohol use: No  . Drug use: No    Home Medications Prior to Admission medications   Medication Sig Start Date End Date Taking? Authorizing Provider  albuterol (PROVENTIL HFA;VENTOLIN HFA) 108 (90 Base) MCG/ACT inhaler Inhale 2 puffs into the lungs every 6 (six) hours as needed for wheezing or shortness of breath. 05/13/18  Yes Collene Gobble, MD  albuterol (PROVENTIL) (2.5 MG/3ML) 0.083% nebulizer solution Take 3 mLs (2.5 mg total) by nebulization every 6 (six) hours as needed for wheezing or shortness of breath. DX: J44.9 02/25/19  Yes Collene Gobble, MD  atenolol (TENORMIN) 50 MG tablet TAKE 1 TABLET BY MOUTH DAILY Patient taking differently: Take 50 mg by mouth daily.  05/22/19  Yes Biagio Borg, MD  Fluticasone-Umeclidin-Vilant (TRELEGY ELLIPTA) 100-62.5-25 MCG/INH AEPB Inhale 1 puff into the lungs daily. 04/17/19  Yes Collene Gobble, MD  Ginseng 100 MG CAPS Take 100 mg by mouth daily.   Yes [provider]  metFORMIN (GLUCOPHAGE) 500 MG tablet TAKE 2 TABLETS BY MOUTH Twice  daily Patient taking differently: Take 1,000 mg by mouth 2 (two) times daily with a meal.  06/05/18  Yes Biagio Borg, MD  methocarbamol (ROBAXIN) 750 MG tablet Take 750 mg by mouth 3 (three) times daily. 02/26/19  Yes [provider]  Misc Natural Products (GINKOGIN PO) Take by mouth.   Yes [provider]  Multiple Vitamins-Minerals (CENTRUM SILVER PO) Take 1 tablet by mouth daily.     Yes [provider]  oxycodone (ROXICODONE) 30 MG immediate release tablet Take 30 mg by mouth 4 (four) times daily as needed for pain. 06/11/19  Yes [provider]  rosuvastatin (CRESTOR) 40 MG tablet Take 1 tablet (40 mg total) by mouth daily. 10/01/17  Yes Biagio Borg, MD  sertraline (ZOLOFT) 100 MG tablet Take 2 tablets (200 mg total) by mouth daily. 01/02/19  Yes Plotnikov, Evie Lacks, MD  Blood Glucose Monitoring Suppl ([place]) w/Device  KIT Use as directed daily E11.9 10/31/18   Biagio Borg, MD  glucose blood [facility] VERIO) test strip Use as instructed daily E11.9 10/31/18   Biagio Borg, MD    Allergies    Naproxen sodium, Sulfonamide derivatives, Belsomra [suvorexant], Chantix [varenicline tartrate], Clonazepam, and Ace inhibitors  Review of Systems   Review of Systems Ten systems are reviewed and are negative for acute change except as noted in the HPI  Physical Exam Updated Vital Signs BP 135/71   Pulse 78   Temp (!) 101 F (38.3 C) (Rectal)   Resp (!) 23   SpO2 94%   Physical Exam Constitutional:      General: She is not in acute distress.    Appearance: Normal appearance. She is well-developed. She is ill-appearing. She is not toxic-appearing or diaphoretic.  HENT:     Head: Normocephalic and atraumatic.     Right Ear: External ear normal.     Left Ear: External ear normal.     Nose: Nose normal.  Eyes:     General: Vision grossly intact. Gaze aligned appropriately.     Pupils: Pupils are equal, round, and reactive to light.   Neck:     Trachea: Trachea and phonation normal. No tracheal deviation.  Cardiovascular:     Rate and Rhythm: Normal rate and regular rhythm.  Pulmonary:     Effort: Pulmonary effort is normal. No respiratory distress.     Breath sounds: Wheezing and rhonchi present.  Chest:     Chest wall: No deformity or tenderness.  Abdominal:     General: There is no distension.     Palpations: Abdomen is soft.     Tenderness: There is no abdominal tenderness. There is no guarding or rebound.  Musculoskeletal:        General: Normal range of motion.     Cervical back: Normal range of motion.     Right lower leg: No tenderness. No edema.     Left lower leg: No tenderness. No edema.  Skin:    General: Skin is warm and dry.     Comments: Warm to touch  Neurological:     Mental Status: She is alert.     GCS: GCS eye subscore is 4. GCS verbal subscore is 5. GCS motor subscore is 6.     Comments: Speech is clear and goal oriented, follows commands Major Cranial nerves without deficit, no facial droop Moves extremities without ataxia, coordination intact  Psychiatric:        Behavior: Behavior normal.     ED Results / Procedures / Treatments   Labs (all labs ordered are listed, but only abnormal results are displayed) Labs Reviewed  BASIC METABOLIC PANEL - Abnormal; Notable for the following components:      Result Value   Glucose, Bld 216 (*)    Calcium 8.6 (*)    All other components within normal limits  BRAIN NATRIURETIC PEPTIDE - Abnormal; Notable for the following components:   B Natriuretic Peptide 308.2 (*)    All other components within normal limits  CBC WITH DIFFERENTIAL/PLATELET - Abnormal; Notable for the following components:   WBC 16.9 (*)    Neutro Abs 15.8 (*)    Lymphs Abs 0.3 (*)    Abs Immature Granulocytes 0.11 (*)    All other components within normal limits  TROPONIN I (HIGH SENSITIVITY) - Abnormal; Notable for the following components:   Troponin I (High  Sensitivity) 39 (*)  All other components within normal limits  RESPIRATORY PANEL BY RT PCR (FLU A&B, COVID)  POC SARS CORONAVIRUS 2 AG -  ED  TROPONIN I (HIGH SENSITIVITY)    EKG EKG Interpretation  Date/Time:  Thursday June 26 2019 10:05:35 EDT Ventricular Rate:  85 PR Interval:    QRS Duration: 101 QT Interval:  388 QTC Calculation: 462 R Axis:   -44 Text Interpretation: Atrial flutter with predominant 4:1 AV block Left axis deviation RSR' in V1 or V2, right VCD or RVH Confirmed by Nat Christen [phone]) on 06/26/2019 11:02:50 AM   Radiology DG Chest Port 1 View  Result Date: 06/26/2019 CLINICAL DATA:  Shortness of breath. EXAM: PORTABLE CHEST 1 VIEW COMPARISON:  Chest x-ray dated Aug 10, 2017. FINDINGS: The heart size and mediastinal contours are within normal limits. Atherosclerotic calcification of the aortic arch. The lungs remain hyperinflated with emphysematous changes and chronically coarsened interstitial markings. Superimposed asymmetric interstitial thickening and hazy opacities at the left lung base. No pleural effusion or pneumothorax. No acute osseous abnormality. IMPRESSION: 1. Left lower lobe pneumonia. 2. COPD. Electronically Signed   By: Titus Dubin M.D.   On: 06/26/2019 11:39    Procedures .Critical Care Performed by: Deliah Boston, PA-C Authorized by: Deliah Boston, PA-C   Critical care provider statement:    Critical care time (minutes):  40   Critical care was necessary to treat or prevent imminent or life-threatening deterioration of the following conditions:  Respiratory failure   Critical care was time spent personally by me on the following activities:  Discussions with consultants, evaluation of patient's response to treatment, examination of patient, ordering and performing treatments and interventions, ordering and review of laboratory studies, ordering and review of radiographic studies, pulse oximetry, re-evaluation of patient's condition,  obtaining history from patient or surrogate, review of old charts and development of treatment plan with patient or surrogate   (including critical care time)  Medications Ordered in ED Medications  azithromycin (ZITHROMAX) 500 mg in sodium chloride 0.9 % 250 mL IVPB (has no administration in time range)  albuterol (VENTOLIN HFA) 108 (90 Base) MCG/ACT inhaler 1-2 puff (2 puffs Inhalation Given 06/26/19 1100)  cefTRIAXone (ROCEPHIN) 1 g in sodium chloride 0.9 % 100 mL IVPB (1 g Intravenous New Bag/Given 06/26/19 1142)    ED Course  I have reviewed the triage vital signs and the nursing notes.  Pertinent labs & imaging results that were available during my care of the patient were reviewed by me and considered in my medical decision making (see chart for details).  Clinical Course as of Jun 26 1319  Thu Jun 26, 2019  1234 Dr. Jonnie Finner   [BM]    Clinical Course User Index [BM] Deliah Boston, PA-C   Harle Stanford was evaluated in Emergency Department on 06/26/2019 for the symptoms described in the history of present illness. She was evaluated in the context of the global COVID-19 pandemic, which necessitated consideration that the patient might be at risk for infection with the SARS-CoV-2 virus that causes COVID-19. Institutional protocols and algorithms that pertain to the evaluation of patients at risk for COVID-19 are in a state of rapid change based on information released by regulatory bodies including the CDC and federal and state organizations. These policies and algorithms were followed during the patient's care in the ED.  MDM Rules/Calculators/A&P                     74 year old female  history of COPD presents with shortness of breath beginning this morning along with fatigue.  No associated chest pain.  Hypoxic on EMS arrival, upon initial evaluation she is resting comfortably on 3 L nasal cannula with SPO2 approximately 93%.  Cranial nerves intact, no meningeal signs, lungs with  mild diffuse rhonchi and wheezing, heart regular rate and rhythm, abdomen soft nontender, neurovascular intact to all 4 extremities without evidence of DVT.  Patient has no fever tachycardia or tachypnea on initial evaluation however she does feel warm to touch will have nursing staff obtain a rectal temperature.  CBC, BMP, BNP, troponin, Covid test, chest x-ray and EKG ordered for evaluation of shortness of breath.  Patient does not appear septic at this time no no SIRS criteria met. - EKG: Atrial flutter with predominant 4:1 AV block Left axis deviation RSR' in V1 or V2, right VCD or RVH Confirmed by Nat Christen [phone]) on 06/26/2019 11:02:50 AM  Chest x-ray:  IMPRESSION:  1. Left lower lobe pneumonia.  2. COPD.  I have personally reviewed patient's chest x-ray and agree with radiologist interpretation.  CBC shows leukocytosis of 16.9 with left shift, no evidence of anemia.  BMP shows no emergent electrolyte abnormality or evidence of kidney injury.  Rapid Covid and Covid PCR test/influenza panel are negative.  High-sensitivity troponin is 39, BNP 308.  Rectal temperature showed fever. - Patient was started on Rocephin and azithromycin empirically for likely bacterial community-acquired pneumonia.  Doubt COVID-19 given to negative tests and leukocytosis today.  She will need admission due to hypoxia.  On reevaluation she is resting comfortably no acute distress SPO2 mid 90s on 3 L nasal cannula.  Patient does have fever and leukocytosis at this point however does not appear septic, blood pressure stable, no indication for 30 cc/kg boluses.  Patient was seen and evaluated by Dr. Lacinda Axon during this visit who agrees with work-up and admission.  12:34 PM: Discussed case with hospitalist Dr. Burnett Sheng who accepted patient for admission.   Note: Portions of this report may have been transcribed using voice recognition software. Every effort was made to ensure accuracy; however, inadvertent computerized  transcription errors may still be present. Final Clinical Impression(s) / ED Diagnoses Final diagnoses:  Community acquired pneumonia of left lung, unspecified part of lung  Acute hypercapnic respiratory failure [facility])    Rx / DC Orders ED Discharge Orders    None       Gari Crown 06/26/19 1321    Nat Christen, MD 06/26/19 1343

## 2019-06-26 NOTE — H&P (Deleted)
[facility] History & Physical  Crystal Splinter MD  Crystal Thornton 06/26/2019  Chief Complaint: Dyspnea HPI: The patient is a 74 y.o. year-old with hx COPD, HTN, hep C, h/o hep B, DM2, depression/ anxiety, chronic pain syndrome brought to ED today 4/1 for SOB/ COPD exacerbation per EMS.  98% on 10L O2 on arrival.  Given IV solumedrol and Zofran en route. Hypoxic O2 sat in 80's on RA in ED, now in 90's w/ [place] O2.  Temp up to 101 deg in ED,BP's wnl, RR up 26 > 22 now. Trop and BNP slightly up , but no CP. Getting albuterol ACT, + IV rocephin and zithromax for suspected PNA.  CXR shows LLL opacities retrocardiac. Asked to see for admission.   Pt describes 2-3 days coughing, worsening SOB primarily.  +prod sputum which is new, no aches or F/C/S, no CP or abd pain, no N/V/D.  No hx heart disease.   Lives home w/ her son, divorced, +tobacco.  Has been eating okay. No confusion.   Pt requests to heroic measures, "let me go in peace".  Last admit in 2019 was for syncope, w/u showed no ACX, echo w/o sig abnormalities, carotid dopplers also neg, orthostatics were negative. Pt was on BB but no bradycardia or hypotension. Meds were continued pt dc'd home.   ROS  denies CP  no joint pain   no HA  no blurry vision  no rash  no dysuria  no difficulty voiding  no change in urine color    Past Medical History  Past Medical History:  Diagnosis Date  . Allergy   . Arthritis    neck  . Cataract    bilateral - MD monitoring cataracts  . CHF (congestive heart failure) ([place])   . Chronic kidney disease, stage I    DR OTTELIN  HX UTIS  . Cirrhosis ([place])   . Cramp of limb   . Diabetes mellitus   . Dysphagia, unspecified(787.20)   . Dysuria   . Epistaxis   . GERD (gastroesophageal reflux disease)   . Heart murmur    NO CARDIOLOGIST  DX FOR YEARS ASYMPTOMATIC  . Lumbago   . Neoplasm of uncertain behavior of skin   . Nonspecific elevation of levels of transaminase or lactic acid  dehydrogenase (LDH)   . Osteoarthrosis, unspecified whether generalized or localized, unspecified site   . Other and unspecified hyperlipidemia    diet controlled  . Pain in joint, shoulder region   . Paresthesias 02/17/2015  . Postablative ovarian failure   . Trochanteric bursitis of left hip 11/02/2015  . Type 2 diabetes mellitus without complication ([place])   . Unspecified essential hypertension    no meds   Past Surgical History  Past Surgical History:  Procedure Laterality Date  . BREAST BIOPSY    . CARDIAC CATHETERIZATION N/A 12/15/2015   Procedure: Left Heart Cath and Coronary Angiography;  Surgeon: Belva Crome, MD;  Location: [place] [facility];  Service: Cardiovascular;  Laterality: N/A;  . COLONOSCOPY  2012   Dr Lajoyce Corners.   . COLONOSCOPY WITH PROPOFOL N/A 05/25/2016   Procedure: COLONOSCOPY WITH PROPOFOL;  Surgeon: Milus Banister, MD;  Location: [facility];  Service: Endoscopy;  Laterality: N/A;  . CORONARY ARTERY BYPASS GRAFT N/A 12/16/2015   Procedure: CORONARY ARTERY BYPASS GRAFTING (CABG) x 3 USING RIGHT LEG GREATER SAPHENOUS VEIN GRAFT;  Surgeon: Melrose Nakayama, MD;  Location: [place];  Service: Open Heart Surgery;  Laterality: N/A;  .  ENDOVEIN HARVEST OF GREATER SAPHENOUS VEIN Right 12/16/2015   Procedure: ENDOVEIN HARVEST OF GREATER SAPHENOUS VEIN;  Surgeon: Melrose Nakayama, MD;  Location: [place];  Service: Open Heart Surgery;  Laterality: Right;  . ESOPHAGEAL BANDING  02/13/2019   Procedure: ESOPHAGEAL BANDING;  Surgeon: Milus Banister, MD;  Location: [facility];  Service: Endoscopy;;  . ESOPHAGEAL BANDING  02/23/2019   Procedure: ESOPHAGEAL BANDING;  Surgeon: Juanita Craver, MD;  Location[facility] END[facility];  Service: Endoscopy;;  . ESOPHAGOGASTRODUODENOSCOPY N/A 02/23/2019   Procedure: ESOPHAGOGASTRODUODENOSCOPY (EGD);  Surgeon: Juanita Craver, MD;  Location: [facility] ENDOSCOPY;  Service: Endoscopy;  Laterality: N/A;  . ESOPHAGOGASTRODUODENOSCOPY (EGD) WITH PROPOFOL N/A  05/25/2016   Procedure: ESOPHAGOGASTRODUODENOSCOPY (EGD) WITH PROPOFOL;  Surgeon: Milus Banister, MD;  Location: [facility];  Service: Endoscopy;  Laterality: N/A;  . ESOPHAGOGASTRODUODENOSCOPY (EGD) WITH PROPOFOL N/A 02/13/2019   Procedure: ESOPHAGOGASTRODUODENOSCOPY (EGD) WITH PROPOFOL;  Surgeon: Milus Banister, MD;  Location: [facility];  Service: Endoscopy;  Laterality: N/A;  . HEMOSTASIS CLIP PLACEMENT  02/23/2019   Procedure: HEMOSTASIS CLIP PLACEMENT;  Surgeon: Juanita Craver, MD;  Location: [place] ENDOSCOPY;  Service: Endoscopy;;  . IR ANGIOGRAM SELECTIVE EACH ADDITIONAL VESSEL  02/24/2019  . IR EMBO ART  VEN HEMORR LYMPH EXTRAV  INC GUIDE ROADMAPPING  02/24/2019  . IR PARACENTESIS  02/24/2019  . IR TIPS  02/24/2019  . MAXIMUM ACCESS (MAS)POSTERIOR LUMBAR INTERBODY FUSION (PLIF) 1 LEVEL Left 04/28/2015   Procedure: FOR MAXIMUM ACCESS (MAS) POSTERIOR LUMBAR INTERBODY FUSION (PLIF) LUMBAR THREE-FOUR EXTRAFORAMINAL MICRODISCECTOMY LUMBAR FIVE-SACRAL ONE LEFT;  Surgeon: Eustace Moore, MD;  Location: [facility]UR[facility];  Service: Neurosurgery;  Laterality: Left;  . RADIOLOGY WITH ANESTHESIA N/A 02/24/2019   Procedure: RADIOLOGY WITH ANESTHESIA;  Surgeon: Radiologist, Medication, MD;  Location: [place];  Service: Radiology;  Laterality: N/A;  . SCLEROTHERAPY  02/23/2019   Procedure: SCLEROTHERAPY;  Surgeon: Juanita Craver, MD;  Location: [facility] ENDOSCOPY;  Service: Endoscopy;;  . TEE WITHOUT CARDIOVERSION N/A 12/16/2015   Procedure: TRANSESOPHAGEAL ECHOCARDIOGRAM (TEE);  Surgeon: Melrose Nakayama, MD;  Location: [place];  Service: Open Heart Surgery;  Laterality: N/A;  . TUBAL LIGATION  1982   Dr Connye Burkitt  . UPPER GASTROINTESTINAL ENDOSCOPY    . VAGINAL HYSTERECTOMY  1997   Dr Rande Lawman   Family History  Family History  Problem Relation Age of Onset  . Lung cancer Father   . Arthritis Sister   . Arthritis Brother   . Heart disease Maternal Grandmother   . Heart disease Maternal Grandfather   . Heart  disease Paternal Grandmother   . Heart disease Paternal Grandfather   . Breast cancer Mother   . Liver cancer Brother   . Breast cancer Maternal Aunt   . Breast cancer Paternal Aunt   . Colon cancer Neg Hx   . Esophageal cancer Neg Hx   . Rectal cancer Neg Hx   . Stomach cancer Neg Hx    Social History  reports that she has never smoked. She has never used smokeless tobacco. She reports that she does not drink alcohol or use drugs. Allergies  Allergies  Allergen Reactions  . Kiwi Extract Anaphylaxis  . Tdap [Tetanus-Diphth-Acell Pertussis] Swelling and Other (See Comments)    Swelling at injection site, gets very hot  . Statins     RHABDOMYOLYSIS  . Latex Itching, Dermatitis and Rash  . Tramadol Nausea And Vomiting   Home medications Prior to Admission medications   Medication Sig Start Date End Date Taking? Authorizing  Provider  acetaminophen (TYLENOL) 500 MG tablet Take 500 mg by mouth at bedtime.     [provider]  Aromatic Inhalants (VICKS VAPOR IN) Vicks Vapor Rub apply small amount to outside of nose to help breathing    [provider]  BD PEN NEEDLE NANO U/F 32G X 4 MM MISC USE THREE TIMES DAILY AS DIRECTED 10/14/18   Reed, Tiffany L, DO  Biotin 10000 MCG TABS Take 10,000 mcg by mouth every morning.    [provider]  bisacodyl (DULCOLAX) 10 MG suppository Place 1 suppository (10 mg total) rectally daily as needed for moderate constipation. 02/28/19   Aline August, MD  calcium carbonate (OS-CAL) 600 MG TABS Take 600 mg by mouth 2 (two) times daily with a meal.      [provider]  Cholecalciferol (VITAMIN D) 50 MCG (2000 UT) CAPS Take 2,000 Units by mouth daily.     [provider]  Cyanocobalamin (VITAMIN B 12 PO) Take 1,000 mcg by mouth daily.      [provider]  ezetimibe (ZETIA) 10 MG tablet TAKE 1 TABLET(10 MG) BY MOUTH DAILY 11/13/18   Reed, Tiffany L, DO  furosemide (LASIX) 40 MG tablet Take 40 mg by mouth  daily.     [provider]  glucose blood test strip One Touch Ultra II strips. Use to test blood sugar three times daily. Dx: E11.65 04/19/17   Reed, Tiffany L, DO  insulin detemir (LEVEMIR) 100 UNIT/ML injection Inject 0.2 mLs (20 Units total) into the skin at bedtime. 03/01/19   Aline August, MD  Insulin Syringe-Needle U-100 (INSULIN SYRINGE 1CC/31GX5/16") 31G X 5/16" 1 ML MISC USE AS DIRECTED DAILY WITH LEVEMIR 06/14/18   Reed, Tiffany L, DO  JARDIANCE 25 MG TABS tablet Take 25 mg by mouth daily. 03/18/19   [provider]  lactulose (CHRONULAC) 10 GM/15ML solution Take 20 g by mouth 3 (three) times daily. 03/22/19   [provider]  loratadine (CLARITIN) 10 MG tablet Take 10 mg by mouth daily as needed for allergies.    [provider]  MAGNESIUM PO Take 500 mg by mouth 2 (two) times daily in the am and at bedtime..    [provider]  Multiple Vitamins-Minerals (MULTIVITAMIN WITH MINERALS) tablet Take 1 tablet by mouth daily.      [provider]  NOVOLOG FLEXPEN 100 UNIT/ML FlexPen Inject 12 units under the skin every morning, 8 units at lunch and 12 units at supper 01/07/19   Reed, Tiffany L, DO  ondansetron (ZOFRAN) 4 MG tablet Take 1 tablet (4 mg total) by mouth every 6 (six) hours as needed for nausea. 02/28/19   Aline August, MD  pantoprazole (PROTONIX) 40 MG tablet Take 1 tablet (40 mg total) by mouth 2 (two) times daily. 02/28/19   Aline August, MD  Polyethyl Glycol-Propyl Glycol (SYSTANE OP) Place 1 drop into both eyes 2 (two) times daily.    [provider]  Probiotic Product (PROBIOTIC DAILY PO) Take 1 capsule by mouth daily. Digestive Advantage Probiotic    [provider]  spironolactone (ALDACTONE) 50 MG tablet Take 1 tablet (50 mg total) by mouth 2 (two) times daily. 03/03/19   Gayland Curry, DO   Liver Function Tests Recent Labs  Lab 03/31/19 1436 04/02/19 1042  AST 58* 62*  ALT 41* 45*  ALKPHOS  --   127*  BILITOT 3.5* 3.4*  PROT 7.8 7.2  ALBUMIN  --  3.0*   Recent  Labs  Lab 04/02/19 1042  LIPASE 29   CBC Recent Labs  Lab 03/31/19 1436 04/02/19 1042 04/02/19 1056  WBC 10.4 7.7  --   NEUTROABS 8,299* 5.9  --   HGB 16.1* 14.9 15.0  HCT 47.2* 43.4 44.0  MCV 92.5 94.6  --   PLT 151 116*  --    Basic Metabolic Panel Recent Labs  Lab 03/31/19 1436 04/02/19 1042 04/02/19 1056  NA 133* 131* 133*  K 4.5 4.6 4.6  CL 96* 97*  --   CO2 24 23  --   GLUCOSE 269* 138*  --   BUN 19 20  --   CREATININE 1.05* 1.13*  --   CALCIUM 11.1* 10.0  --    Iron/TIBC/Ferritin/ %Sat    Component Value Date/Time   IRON 29 03/25/2016 1422   TIBC 427 03/25/2016 1422   FERRITIN 13 03/25/2016 1422   IRONPCTSAT 7 (L) 03/25/2016 1422    Vitals:   04/02/19 1018 04/02/19 1030 04/02/19 1045 04/02/19 1215  BP: (!) 123/51 (!) 101/46  (!) 125/54  Pulse: 89 83    Resp: 16 15  20   Temp: 97.8 F (36.6 C)  97.9 F (36.6 C)   TempSrc: Oral  Rectal   SpO2: 99% 98%      Exam Gen alert pleasant WF not in distress, nasal O2, coughing No rash, cyanosis or gangrene Sclera anicteric, throat clear  No jvd or bruits Chest clear on R , large crackles L base, no wheezing, good air movement in general RRR no MRG Abd soft ntnd no mass or ascites +bs GU deferred MS no joint effusions or deformity Ext trace pretib edema, no wounds or ulcers Neuro is alert, Ox 3 , nf    Home meds:  - atenolol 50 qd  - rosuvastatin 40  - sertraline 200 qd  - prn lunesta hs/ oxycodone IR 30mg  qid prn/ prn robaxin  - trelegy ellipta qd  - metformin 1gm bid   - prn's/ vitamins/ supplements     CXR 4/1 > IMPRESSION: ...the lungs remain hyperinflated with emphysematous changes and chronically coarsened interstitial markings. Superimposed asymmetric interstitial thickening and hazy opacities at the left lung base   COVID-19 PCR neg, flu A/b neg  Assessment/ Plan: 1. Comm- acquired PNA - w/ 2-3 days cough and  worsening SOB, underlying COPD.  ^temp/ wbc / LLL infiltrate on CXR.  Admit, IV abx and IVF's, O2 support, full liquid diet.  COVID testing is negative, +pt immunized fully.  2. HTN - cont atenolol, BP's and HR are wnl 3. Depression - cont zoloft 4. COPD - not on O2 at home 5. DM2 - SSI here, hold metformin 6. DNR - pt request, confirmed w/ son      Crystal Splinter, MD   [facility] 04/02/2019, 1:10 PM

## 2019-06-27 DIAGNOSIS — E785 Hyperlipidemia, unspecified: Secondary | ICD-10-CM | POA: Diagnosis present

## 2019-06-27 DIAGNOSIS — N181 Chronic kidney disease, stage 1: Secondary | ICD-10-CM | POA: Diagnosis present

## 2019-06-27 DIAGNOSIS — F17210 Nicotine dependence, cigarettes, uncomplicated: Secondary | ICD-10-CM | POA: Diagnosis present

## 2019-06-27 DIAGNOSIS — Z981 Arthrodesis status: Secondary | ICD-10-CM | POA: Diagnosis not present

## 2019-06-27 DIAGNOSIS — J9602 Acute respiratory failure with hypercapnia: Secondary | ICD-10-CM | POA: Diagnosis present

## 2019-06-27 DIAGNOSIS — R0602 Shortness of breath: Secondary | ICD-10-CM | POA: Diagnosis present

## 2019-06-27 DIAGNOSIS — E1122 Type 2 diabetes mellitus with diabetic chronic kidney disease: Secondary | ICD-10-CM | POA: Diagnosis present

## 2019-06-27 DIAGNOSIS — Z951 Presence of aortocoronary bypass graft: Secondary | ICD-10-CM | POA: Diagnosis not present

## 2019-06-27 DIAGNOSIS — J9601 Acute respiratory failure with hypoxia: Secondary | ICD-10-CM | POA: Diagnosis present

## 2019-06-27 DIAGNOSIS — K219 Gastro-esophageal reflux disease without esophagitis: Secondary | ICD-10-CM | POA: Diagnosis present

## 2019-06-27 DIAGNOSIS — J189 Pneumonia, unspecified organism: Secondary | ICD-10-CM | POA: Diagnosis not present

## 2019-06-27 DIAGNOSIS — Z801 Family history of malignant neoplasm of trachea, bronchus and lung: Secondary | ICD-10-CM | POA: Diagnosis not present

## 2019-06-27 DIAGNOSIS — Z7951 Long term (current) use of inhaled steroids: Secondary | ICD-10-CM | POA: Diagnosis not present

## 2019-06-27 DIAGNOSIS — Z66 Do not resuscitate: Secondary | ICD-10-CM | POA: Diagnosis present

## 2019-06-27 DIAGNOSIS — J441 Chronic obstructive pulmonary disease with (acute) exacerbation: Secondary | ICD-10-CM | POA: Diagnosis present

## 2019-06-27 DIAGNOSIS — B1920 Unspecified viral hepatitis C without hepatic coma: Secondary | ICD-10-CM | POA: Diagnosis present

## 2019-06-27 DIAGNOSIS — Z800 Family history of malignant neoplasm of digestive organs: Secondary | ICD-10-CM | POA: Diagnosis not present

## 2019-06-27 DIAGNOSIS — J440 Chronic obstructive pulmonary disease with acute lower respiratory infection: Secondary | ICD-10-CM | POA: Diagnosis present

## 2019-06-27 DIAGNOSIS — Z20822 Contact with and (suspected) exposure to covid-19: Secondary | ICD-10-CM | POA: Diagnosis present

## 2019-06-27 DIAGNOSIS — I129 Hypertensive chronic kidney disease with stage 1 through stage 4 chronic kidney disease, or unspecified chronic kidney disease: Secondary | ICD-10-CM | POA: Diagnosis present

## 2019-06-27 DIAGNOSIS — E1159 Type 2 diabetes mellitus with other circulatory complications: Secondary | ICD-10-CM | POA: Diagnosis present

## 2019-06-27 DIAGNOSIS — J159 Unspecified bacterial pneumonia: Secondary | ICD-10-CM | POA: Diagnosis present

## 2019-06-27 DIAGNOSIS — Z8261 Family history of arthritis: Secondary | ICD-10-CM | POA: Diagnosis not present

## 2019-06-27 DIAGNOSIS — G894 Chronic pain syndrome: Secondary | ICD-10-CM | POA: Diagnosis present

## 2019-06-27 DIAGNOSIS — K5790 Diverticulosis of intestine, part unspecified, without perforation or abscess without bleeding: Secondary | ICD-10-CM | POA: Diagnosis present

## 2019-06-27 DIAGNOSIS — I4892 Unspecified atrial flutter: Secondary | ICD-10-CM | POA: Diagnosis present

## 2019-06-27 LAB — GLUCOSE, CAPILLARY
Glucose-Capillary: 118 mg/dL — ABNORMAL HIGH (ref 70–99)
Glucose-Capillary: 131 mg/dL — ABNORMAL HIGH (ref 70–99)
Glucose-Capillary: 196 mg/dL — ABNORMAL HIGH (ref 70–99)
Glucose-Capillary: 84 mg/dL (ref 70–99)
Glucose-Capillary: 99 mg/dL (ref 70–99)

## 2019-06-27 LAB — CBC
HCT: 38 % (ref 36.0–46.0)
Hemoglobin: 12.5 g/dL (ref 12.0–15.0)
MCH: 31.6 pg (ref 26.0–34.0)
MCHC: 32.9 g/dL (ref 30.0–36.0)
MCV: 96.2 fL (ref 80.0–100.0)
Platelets: 195 10*3/uL (ref 150–400)
RBC: 3.95 MIL/uL (ref 3.87–5.11)
RDW: 13.8 % (ref 11.5–15.5)
WBC: 15.5 10*3/uL — ABNORMAL HIGH (ref 4.0–10.5)
nRBC: 0 % (ref 0.0–0.2)

## 2019-06-27 LAB — HEMOGLOBIN A1C
Hgb A1c MFr Bld: 6.5 % — ABNORMAL HIGH (ref 4.8–5.6)
Mean Plasma Glucose: 139.85 mg/dL

## 2019-06-27 LAB — HIV ANTIBODY (ROUTINE TESTING W REFLEX): HIV Screen 4th Generation wRfx: NONREACTIVE

## 2019-06-27 MED ORDER — ORAL CARE MOUTH RINSE
15.0000 mL | Freq: Two times a day (BID) | OROMUCOSAL | Status: DC
  Administered 2019-06-27 – 2019-06-28 (×2): 15 mL via OROMUCOSAL

## 2019-06-27 NOTE — Evaluation (Signed)
Physical Therapy Evaluation Patient Details Name: Crystal Thornton MRN: [id number] DOB: 1945-09-16 Today's Date: 06/27/2019   History of Present Illness  Pt admitted with PNA and with hx of COPD, DM, cervical fusion and L ankle fx (14)  Clinical Impression  Pt admitted as above and presenting with functional mobility limitations 2* decreased endurance and mild ambulatory balance deficits.  Pt should progress to dc home with family assist.  This session, pt ambulating in halls with min assist and 1L O2 - sats maintained at 94%+.    Follow Up Recommendations No PT follow up    Equipment Recommendations  None recommended by PT    Recommendations for Other Services       Precautions / Restrictions Precautions Precautions: Fall      Mobility  Bed Mobility Overal bed mobility: Modified Independent                Transfers Overall transfer level: Needs assistance   Transfers: Sit to/from Stand Sit to Stand: Supervision            Ambulation/Gait Ambulation/Gait assistance: Min guard;Supervision Gait Distance (Feet): 450 Feet Assistive device: IV Pole Gait Pattern/deviations: Step-through pattern;Decreased step length - right;Decreased step length - left;Shuffle;Antalgic;Trunk flexed     General Gait Details: mild general instability with pt reaching for IV pole for support; increasingly antalgic gait with increased distance ambulated - 2* L ankle pain  Stairs            Wheelchair Mobility    Modified Rankin (Stroke Patients Only)       Balance Overall balance assessment: Mild deficits observed, not formally tested                                           Pertinent Vitals/Pain Pain Assessment: Faces Faces Pain Scale: Hurts little more Pain Location: L ankle with increased distance ambulated Pain Descriptors / Indicators: Grimacing;Guarding Pain Intervention(s): Limited activity within patient's tolerance;Monitored during session     Home Living Family/patient expects to be discharged to:: Private residence Living Arrangements: Children Available Help at Discharge: Family;Available 24 hours/day Type of Home: Mobile home Home Access: Stairs to enter Entrance Stairs-Rails: Right Entrance Stairs-Number of Steps: 4 Home Layout: One level Home Equipment: Walker - 2 wheels;Bedside commode;Cane - single point Additional Comments: Pt reports living with her son who does not work    Prior Function Level of Independence: Independent               Hand Dominance   Dominant Hand: Right    Extremity/Trunk Assessment   Upper Extremity Assessment Upper Extremity Assessment: Overall WFL for tasks assessed    Lower Extremity Assessment Lower Extremity Assessment: Overall WFL for tasks assessed       Communication   Communication: No difficulties  Cognition Arousal/Alertness: Awake/alert Behavior During Therapy: WFL for tasks assessed/performed Overall Cognitive Status: Within Functional Limits for tasks assessed                                        General Comments      Exercises     Assessment/Plan    PT Assessment Patient needs continued PT services  PT Problem List Decreased activity tolerance;Decreased balance;Decreased mobility;Pain       PT Treatment Interventions DME instruction;Gait  training;Stair training;Functional mobility training;Therapeutic exercise;Therapeutic activities;Balance training;Patient/family education    PT Goals (Current goals can be found in the Care Plan section)  Acute Rehab PT Goals Patient Stated Goal: Regain IND PT Goal Formulation: With patient Time For Goal Achievement: 07/11/19 Potential to Achieve Goals: Good    Frequency Min 3X/week   Barriers to discharge        Co-evaluation               AM-PAC PT "6 Clicks" Mobility  Outcome Measure Help needed turning from your back to your side while in a flat bed without using  bedrails?: None Help needed moving from lying on your back to sitting on the side of a flat bed without using bedrails?: None Help needed moving to and from a bed to a chair (including a wheelchair)?: A Little Help needed standing up from a chair using your arms (e.g., wheelchair or bedside chair)?: A Little Help needed to walk in hospital room?: A Little Help needed climbing 3-5 steps with a railing? : A Little 6 Click Score: 20    End of Session Equipment Utilized During Treatment: Gait belt;Oxygen Activity Tolerance: Patient tolerated treatment well Patient left: in chair;with call bell/phone within reach Nurse Communication: Mobility status PT Visit Diagnosis: Difficulty in walking, not elsewhere classified (R26.2);Unsteadiness on feet (R26.81)    Time: [id number] PT Time Calculation (min) (ACUTE ONLY): 22 min   Charges:   PT Evaluation $PT Eval Low Complexity: 1 Low          [place] Pager [phone] Office [phone]   Markas Aldredge 06/27/2019, 1:10 PM

## 2019-06-27 NOTE — Progress Notes (Signed)
PROGRESS NOTE    Crystal Thornton  [id number] DOB: May 04, 1945 DOA: 06/26/2019 PCP: Biagio Borg, MD  Brief Narrative:74 yo female history of COPD, hypertension admitted with fever cough and shortness of breath found to have left lower lobe pneumonia. Chest x-ray shows left lower lobe opacities. When she arrived to the ER her temperature was 101 with tachypnea and pulse ox 80s on room air. When EMS picked her up her saturation was 98% on 10 L. Patient lives at home with her son.  Assessment & Plan:   Principal Problem:   Community acquired pneumonia Active Problems:   HEPATITIS C   Diabetes mellitus type 2, uncontrolled ([place])   Essential hypertension   Cough   Smoker   Chronic pain syndrome   COPD (chronic obstructive pulmonary disease) (HCC)   Acute respiratory failure with hypoxia (HCC)   CAP (community acquired pneumonia)   #1 community-acquired pneumonia-patient admitted with fever shortness of breath and cough with findings consistent with left lower lobe pneumonia. Patient still on oxygen at 2 L intermittently tachypneic and is still with leukocytosis. Covid is negative. Out of bed ambulate PT consult Saturation 96% on 2 L titrate oxygen to keep her sats above 90%. Nebulizer treatments. WBC 1500. Chest x-ray left lower lobe pneumonia and COPD. Continue Rocephin and azithromycin BNP is over 300 her last echo from 2019 with normal ejection fraction will DC IV fluids.  #2 history of essential hypertension on atenolol  #3 depression on Zoloft  #4 history of COPD not on oxygen prior to admission  #5 history of type 2 diabetes continue SSI hold Metformin  #6 goals of care patient is DNR  Estimated body mass index is 20.72 kg/m as calculated from the following:   Height as of this encounter: 5' 7.5" (1.715 m).   Weight as of this encounter: 60.9 kg.  DVT prophylaxis: Lovenox Code Status: DNR Family Communication: Discussed with patient Disposition Plan: Patient came  from home Plan is to discharge her home Pending PT consult Barrier to discharge admitted with fever shortness of breath cough with left lower lobe pneumonia and hypoxic and tachypneic  Consultants:   None  Procedures: None Antimicrobials: Rocephin and azithromycin  Subjective: Patient resting in bed feels better but not back to baseline continues to have shortness of breath and cough  Objective: Vitals:   06/26/19 2010 06/26/19 2355 06/27/19 0421 06/27/19 0902  BP: 132/73 120/70 (!) 141/70   Pulse: 72 65 (!) 54   Resp: (!) 21 19 20    Temp: 98 F (36.7 C) 98.2 F (36.8 C) 98.6 F (37 C)   TempSrc: Oral Oral    SpO2: 99% 97% 100% 96%  Weight:   60.9 kg   Height:   5' 7.5" (1.715 m)     Intake/Output Summary (Last 24 hours) at 06/27/2019 1115 Last data filed at 06/27/2019 0400 Gross per 24 hour  Intake 893.8 ml  Output --  Net 893.8 ml   Filed Weights   06/27/19 0421  Weight: 60.9 kg    Examination:  General exam: Appears calm and comfortable  Respiratory system: Crackles at the left base to auscultation. Respiratory effort normal. Cardiovascular system: S1 & S2 heard, RRR. No JVD, murmurs, rubs, gallops or clicks. No pedal edema. Gastrointestinal system: Abdomen is nondistended, soft and nontender. No organomegaly or masses felt. Normal bowel sounds heard. Central nervous system: Alert and oriented. No focal neurological deficits. Extremities: Trace edema Skin: No rashes, lesions or ulcers Psychiatry: Judgement and insight  appear normal. Mood & affect appropriate.     Data Reviewed: I have personally reviewed following labs and imaging studies  CBC: Recent Labs  Lab 06/26/19 1059 06/27/19 0518  WBC 16.9* 15.5*  NEUTROABS 15.8*  --   HGB 13.9 12.5  HCT 42.2 38.0  MCV 96.1 96.2  PLT 200 0000000   Basic Metabolic Panel: Recent Labs  Lab 06/26/19 1059  NA 135  K 5.0  CL 101  CO2 26  GLUCOSE 216*  BUN 19  CREATININE 0.72  CALCIUM 8.6*    GFR: Estimated Creatinine Clearance: 60.2 mL/min (by C-G formula based on SCr of 0.72 mg/dL). Liver Function Tests: No results for input(s): AST, ALT, ALKPHOS, BILITOT, PROT, ALBUMIN in the last 168 hours. No results for input(s): LIPASE, AMYLASE in the last 168 hours. No results for input(s): AMMONIA in the last 168 hours. Coagulation Profile: No results for input(s): INR, PROTIME in the last 168 hours. Cardiac Enzymes: No results for input(s): CKTOTAL, CKMB, CKMBINDEX, TROPONINI in the last 168 hours. BNP (last 3 results) No results for input(s): PROBNP in the last 8760 hours. HbA1C: Recent Labs    06/27/19 0518  HGBA1C 6.5*   CBG: Recent Labs  Lab 06/26/19 1645 06/26/19 2246 06/27/19 0808  GLUCAP 207* 156* 99   Lipid Profile: No results for input(s): CHOL, HDL, LDLCALC, TRIG, CHOLHDL, LDLDIRECT in the last 72 hours. Thyroid Function Tests: No results for input(s): TSH, T4TOTAL, FREET4, T3FREE, THYROIDAB in the last 72 hours. Anemia Panel: No results for input(s): VITAMINB12, FOLATE, FERRITIN, TIBC, IRON, RETICCTPCT in the last 72 hours. Sepsis Labs: No results for input(s): PROCALCITON, LATICACIDVEN in the last 168 hours.  Recent Results (from the past 240 hour(s))  Respiratory Panel by RT PCR (Flu A&B, Covid) - Nasopharyngeal Swab     Status: None   Collection Time: 06/26/19 10:59 AM   Specimen: Nasopharyngeal Swab  Result Value Ref Range Status   SARS Coronavirus 2 by RT PCR NEGATIVE NEGATIVE Final    Comment: (NOTE) SARS-CoV-2 target nucleic acids are NOT DETECTED. The SARS-CoV-2 RNA is generally detectable in upper respiratoy specimens during the acute phase of infection. The lowest concentration of SARS-CoV-2 viral copies this assay can detect is 131 copies/mL. A negative result does not preclude SARS-Cov-2 infection and should not be used as the sole basis for treatment or other patient management decisions. A negative result may occur with  improper  specimen collection/handling, submission of specimen other than nasopharyngeal swab, presence of viral mutation(s) within the areas targeted by this assay, and inadequate number of viral copies (<131 copies/mL). A negative result must be combined with clinical observations, patient history, and epidemiological information. The expected result is Negative. Fact Sheet for Patients:  PinkCheek.be Fact Sheet for Healthcare Providers:  GravelBags.it This test is not yet ap proved or cleared by the [place] FDA and  has been authorized for detection and/or diagnosis of SARS-CoV-2 by FDA under an Emergency Use Authorization (EUA). This EUA will remain  in effect (meaning this test can be used) for the duration of the COVID-19 declaration under Section 564(b)(1) of the Act, 21 U.S.C. section 360bbb-3(b)(1), unless the authorization is terminated or revoked sooner.    Influenza A by PCR NEGATIVE NEGATIVE Final   Influenza B by PCR NEGATIVE NEGATIVE Final    Comment: (NOTE) The Xpert Xpress SARS-CoV-2/FLU/RSV assay is intended as an aid in  the diagnosis of influenza from Nasopharyngeal swab specimens and  should not be used as a sole  basis for treatment. Nasal washings and  aspirates are unacceptable for Xpert Xpress SARS-CoV-2/FLU/RSV  testing. Fact Sheet for Patients: PinkCheek.be Fact Sheet for Healthcare Providers: GravelBags.it This test is not yet approved or cleared by the [place] FDA and  has been authorized for detection and/or diagnosis of SARS-CoV-2 by  FDA under an Emergency Use Authorization (EUA). This EUA will remain  in effect (meaning this test can be used) for the duration of the  Covid-19 declaration under Section 564(b)(1) of the Act, 21  U.S.C. section 360bbb-3(b)(1), unless the authorization is  terminated or revoked. Performed at [facility], [place] [address], [place], [place] [address]          Radiology Studies: [facility] Chest Port 1 View  Result Date: 06/26/2019 CLINICAL DATA:  Shortness of breath. EXAM: PORTABLE CHEST 1 VIEW COMPARISON:  Chest x-ray dated Aug 10, 2017. FINDINGS: The heart size and mediastinal contours are within normal limits. Atherosclerotic calcification of the aortic arch. The lungs remain hyperinflated with emphysematous changes and chronically coarsened interstitial markings. Superimposed asymmetric interstitial thickening and hazy opacities at the left lung base. No pleural effusion or pneumothorax. No acute osseous abnormality. IMPRESSION: 1. Left lower lobe pneumonia. 2. COPD. Electronically Signed   By: Titus Dubin M.D.   On: 06/26/2019 11:39        Scheduled Meds: . atenolol  50 mg Oral Daily  . enoxaparin (LOVENOX) injection  40 mg Subcutaneous Q24H  . fluticasone furoate-vilanterol  1 puff Inhalation Daily   And  . umeclidinium bromide  1 puff Inhalation Daily  . insulin aspart  0-5 Units Subcutaneous QHS  . insulin aspart  0-9 Units Subcutaneous TID WC  . mouth rinse  15 mL Mouth Rinse BID  . rosuvastatin  40 mg Oral Daily  . sertraline  200 mg Oral Daily   Continuous Infusions: . sodium chloride 75 mL/hr at 06/27/19 0918  . azithromycin 500 mg (06/27/19 1038)  . cefTRIAXone (ROCEPHIN)  IV 2 g (06/27/19 0924)     LOS: 0 days   Georgette Shell, MD  06/27/2019, 11:15 AM

## 2019-06-28 LAB — GLUCOSE, CAPILLARY
Glucose-Capillary: 102 mg/dL — ABNORMAL HIGH (ref 70–99)
Glucose-Capillary: 97 mg/dL (ref 70–99)
Glucose-Capillary: 163 mg/dL — ABNORMAL HIGH (ref 70–99)

## 2019-06-28 LAB — CBC
HCT: 40.2 % (ref 36.0–46.0)
Hemoglobin: 12.7 g/dL (ref 12.0–15.0)
MCH: 30.9 pg (ref 26.0–34.0)
MCHC: 31.6 g/dL (ref 30.0–36.0)
MCV: 97.8 fL (ref 80.0–100.0)
Platelets: 216 10*3/uL (ref 150–400)
RBC: 4.11 MIL/uL (ref 3.87–5.11)
RDW: 13.9 % (ref 11.5–15.5)
WBC: 10 10*3/uL (ref 4.0–10.5)
nRBC: 0 % (ref 0.0–0.2)

## 2019-06-28 LAB — BASIC METABOLIC PANEL
Anion gap: 7 (ref 5–15)
BUN: 25 mg/dL — ABNORMAL HIGH (ref 8–23)
CO2: 26 mmol/L (ref 22–32)
Calcium: 9.2 mg/dL (ref 8.9–10.3)
Chloride: 109 mmol/L (ref 98–111)
Creatinine, Ser: 0.92 mg/dL (ref 0.44–1.00)
GFR calc Af Amer: >60 mL/min (ref >60)
GFR calc non Af Amer: >60 mL/min (ref >60)
Glucose, Bld: 106 mg/dL — ABNORMAL HIGH (ref 70–99)
Potassium: 5.3 mmol/L — ABNORMAL HIGH (ref 3.5–5.1)
Sodium: 142 mmol/L (ref 135–145)

## 2019-06-28 MED ORDER — SODIUM ZIRCONIUM CYCLOSILICATE 10 G PO PACK
10.0000 g | PACK | Freq: Once | ORAL | Status: AC
  Administered 2019-06-28: 10 g via ORAL
  Filled 2019-06-28: qty 1

## 2019-06-28 MED ORDER — AMOXICILLIN-POT CLAVULANATE 875-125 MG PO TABS: 1.0000 | ORAL_TABLET | Freq: Two times a day (BID) | ORAL | 0 refills | Status: DC

## 2019-06-28 MED ORDER — AMOXICILLIN-POT CLAVULANATE 875-125 MG PO TABS
1.0000 | ORAL_TABLET | Freq: Two times a day (BID) | ORAL | Status: DC
  Administered 2019-06-28: 1 via ORAL
  Filled 2019-06-28: qty 1

## 2019-06-28 NOTE — Discharge Summary (Signed)
Physician Discharge Summary  Crystal Thornton WJX:[id number] DOB: [birth year] DOA: 06/26/2019  PCP: Biagio Borg, MD  Admit date: 06/26/2019 Discharge date: 06/28/2019  Admitted From: Home Disposition: Home Recommendations for Outpatient Follow-up:  1. Follow up with PCP in 1-2 weeks 2. Please obtain BMP/CBC in one week  Home Health: None Equipment/Devices: None Discharge Condition: Stable and improved CODE STATUS DO NOT RESUSCITATE Diet recommendation: Cardiac diet Brief/Interim Summary:74 yo female history of COPD, hypertension admitted with fever cough and shortness of breath found to have left lower lobe pneumonia. Chest x-ray shows left lower lobe opacities. When she arrived to the ER her temperature was 101 with tachypnea and pulse ox 80s on room air. When EMS picked her up her saturation was 98% on 10 L. Patient lives at home with her son.   Discharge Diagnoses:  Principal Problem:   Community acquired pneumonia Active Problems:   HEPATITIS C   Diabetes mellitus type 2, uncontrolled ([place])   Essential hypertension   Cough   Smoker   Chronic pain syndrome   COPD (chronic obstructive pulmonary disease) (HCC)   Acute respiratory failure with hypoxia (HCC)   CAP (community acquired pneumonia)  #1 community-acquired pneumonia-patient admitted with fever shortness of breath and cough with findings consistent with left lower lobe pneumonia.  She was treated with Rocephin and azithromycin.  She remained afebrile with leukocytosis resolved.  On the day of discharge she was on room air saturating normal.  She will be discharged on Augmentin for 5 days.  She was Covid negative.  She was seen by physical therapy who did not recommend home health PT.    #2 history of essential hypertension on atenolol  #3 depression on Zoloft  #4 history of COPD not on oxygen   #5 history of type 2 diabetes continue Metformin  #6 goals of care patient is DNR  #7 hyperkalemia potassium was 5.3 was  given [place] advised her to follow-up with PCP next week to check BMP.  Estimated body mass index is 20.72 kg/m as calculated from the following:   Height as of this encounter: 5' 7.5" (1.715 m).   Weight as of this encounter: 60.9 kg.  Discharge Instructions  Discharge Instructions    Call MD for:  difficulty breathing, headache or visual disturbances   Complete by: As directed    Call MD for:  temperature >100.4   Complete by: As directed    Diet - low sodium heart healthy   Complete by: As directed    Increase activity slowly   Complete by: As directed      Allergies as of 06/28/2019      Reactions   Naproxen Sodium Anaphylaxis   Sulfonamide Derivatives Other (See Comments)   Causes nephritis   Belsomra [suvorexant]    To not take per pain management   Chantix [varenicline Tartrate] Other (See Comments)   Clonazepam    To not take per pain management   Ace Inhibitors Swelling   Lip swelling      Medication List    TAKE these medications   albuterol 108 (90 Base) MCG/ACT inhaler Commonly known as: VENTOLIN HFA Inhale 2 puffs into the lungs every 6 (six) hours as needed for wheezing or shortness of breath.   albuterol (2.5 MG/3ML) 0.083% nebulizer solution Commonly known as: PROVENTIL Take 3 mLs (2.5 mg total) by nebulization every 6 (six) hours as needed for wheezing or shortness of breath. DX: J44.9   amoxicillin-clavulanate 875-125 MG tablet Commonly known  as: AUGMENTIN Take 1 tablet by mouth every 12 (twelve) hours.   atenolol 50 MG tablet Commonly known as: TENORMIN TAKE 1 TABLET BY MOUTH DAILY   CENTRUM SILVER PO Take 1 tablet by mouth daily.   GINKOGIN PO Take by mouth.   Ginseng 100 MG Caps Take 100 mg by mouth daily.   metFORMIN 500 MG tablet Commonly known as: GLUCOPHAGE TAKE 2 TABLETS BY MOUTH Twice daily What changed:   how much to take  how to take this  when to take this  additional instructions   methocarbamol 750 MG  tablet Commonly known as: ROBAXIN Take 750 mg by mouth 3 (three) times daily.   OneTouch Verio Flex System w/Device Kit Use as directed daily E11.9   OneTouch Verio test strip Generic drug: glucose blood Use as instructed daily E11.9   oxycodone 30 MG immediate release tablet Commonly known as: ROXICODONE Take 30 mg by mouth 4 (four) times daily as needed for pain.   rosuvastatin 40 MG tablet Commonly known as: Crestor Take 1 tablet (40 mg total) by mouth daily.   sertraline 100 MG tablet Commonly known as: ZOLOFT Take 2 tablets (200 mg total) by mouth daily.   Trelegy Ellipta 100-62.5-25 MCG/INH Aepb Generic drug: Fluticasone-Umeclidin-Vilant Inhale 1 puff into the lungs daily.       Allergies  Allergen Reactions  . Naproxen Sodium Anaphylaxis  . Sulfonamide Derivatives Other (See Comments)    Causes nephritis  . Belsomra [Suvorexant]     To not take per pain management  . Chantix [Varenicline Tartrate] Other (See Comments)  . Clonazepam     To not take per pain management  . Ace Inhibitors Swelling    Lip swelling    Consultations: None Procedures/Studies: DG Chest Port 1 View  Result Date: 06/26/2019 CLINICAL DATA:  Shortness of breath. EXAM: PORTABLE CHEST 1 VIEW COMPARISON:  Chest x-ray dated Aug 10, 2017. FINDINGS: The heart size and mediastinal contours are within normal limits. Atherosclerotic calcification of the aortic arch. The lungs remain hyperinflated with emphysematous changes and chronically coarsened interstitial markings. Superimposed asymmetric interstitial thickening and hazy opacities at the left lung base. No pleural effusion or pneumothorax. No acute osseous abnormality. IMPRESSION: 1. Left lower lobe pneumonia. 2. COPD. Electronically Signed   By: Titus Dubin M.D.   On: 06/26/2019 11:39    (Echo, Carotid, EGD, Colonoscopy, ERCP)    Subjective: She is resting in bed no complaints feeling better breathing better no nausea vomiting  diarrhea abdominal pain chest pain or shortness of breath.   Discharge Exam: Vitals:   06/28/19 0639 06/28/19 0755  BP: (!) 164/84   Pulse: 62   Resp: 18   Temp: 97.8 F (36.6 C)   SpO2: 96% 94%   Vitals:   06/27/19 1405 06/27/19 2010 06/28/19 0639 06/28/19 0755  BP: (!) 96/57 140/72 (!) 164/84   Pulse: (!) 54 63 62   Resp: '18 18 18   ' Temp: 99.1 F (37.3 C) 97.8 F (36.6 C) 97.8 F (36.6 C)   TempSrc: Oral Oral Oral   SpO2: 93% 95% 96% 94%  Weight:      Height:        General: Pt is alert, awake, not in acute distress Cardiovascular: RRR, S1/S2 +, no rubs, no gallops Respiratory few scattered rails bilaterally, no wheezing, no rhonchi Abdominal: Soft, NT, ND, bowel sounds + Extremities: no edema, no cyanosis    The results of significant diagnostics from this hospitalization (including imaging, microbiology,  ancillary and laboratory) are listed below for reference.     Microbiology: Recent Results (from the past 240 hour(s))  Respiratory Panel by RT PCR (Flu A&B, Covid) - Nasopharyngeal Swab     Status: None   Collection Time: 06/26/19 10:59 AM   Specimen: Nasopharyngeal Swab  Result Value Ref Range Status   SARS Coronavirus 2 by RT PCR NEGATIVE NEGATIVE Final    Comment: (NOTE) SARS-CoV-2 target nucleic acids are NOT DETECTED. The SARS-CoV-2 RNA is generally detectable in upper respiratoy specimens during the acute phase of infection. The lowest concentration of SARS-CoV-2 viral copies this assay can detect is 131 copies/mL. A negative result does not preclude SARS-Cov-2 infection and should not be used as the sole basis for treatment or other patient management decisions. A negative result may occur with  improper specimen collection/handling, submission of specimen other than nasopharyngeal swab, presence of viral mutation(s) within the areas targeted by this assay, and inadequate number of viral copies (<131 copies/mL). A negative result must be combined  with clinical observations, patient history, and epidemiological information. The expected result is Negative. Fact Sheet for Patients:  PinkCheek.be Fact Sheet for Healthcare Providers:  GravelBags.it This test is not yet ap proved or cleared by the [place] FDA and  has been authorized for detection and/or diagnosis of SARS-CoV-2 by FDA under an Emergency Use Authorization (EUA). This EUA will remain  in effect (meaning this test can be used) for the duration of the COVID-19 declaration under Section 564(b)(1) of the Act, 21 U.S.C. section 360bbb-3(b)(1), unless the authorization is terminated or revoked sooner.    Influenza A by PCR NEGATIVE NEGATIVE Final   Influenza B by PCR NEGATIVE NEGATIVE Final    Comment: (NOTE) The Xpert Xpress SARS-CoV-2/FLU/RSV assay is intended as an aid in  the diagnosis of influenza from Nasopharyngeal swab specimens and  should not be used as a sole basis for treatment. Nasal washings and  aspirates are unacceptable for Xpert Xpress SARS-CoV-2/FLU/RSV  testing. Fact Sheet for Patients: PinkCheek.be Fact Sheet for Healthcare Providers: GravelBags.it This test is not yet approved or cleared by the [place] FDA and  has been authorized for detection and/or diagnosis of SARS-CoV-2 by  FDA under an Emergency Use Authorization (EUA). This EUA will remain  in effect (meaning this test can be used) for the duration of the  Covid-19 declaration under Section 564(b)(1) of the Act, 21  U.S.C. section 360bbb-3(b)(1), unless the authorization is  terminated or revoked. Performed at [facility], [place] [address] [place], [place] [address]      Labs: BNP (last 3 results) Recent Labs    06/26/19 1059  BNP 818.5*   Basic Metabolic Panel: Recent Labs  Lab 06/26/19 1059 06/28/19 0540  NA 135 142  K 5.0 5.3*   CL 101 109  CO2 26 26  GLUCOSE 216* 106*  BUN 19 25*  CREATININE 0.72 0.92  CALCIUM 8.6* 9.2   Liver Function Tests: No results for input(s): AST, ALT, ALKPHOS, BILITOT, PROT, ALBUMIN in the last 168 hours. No results for input(s): LIPASE, AMYLASE in the last 168 hours. No results for input(s): AMMONIA in the last 168 hours. CBC: Recent Labs  Lab 06/26/19 1059 06/27/19 0518 06/28/19 0540  WBC 16.9* 15.5* 10.0  NEUTROABS 15.8*  --   --   HGB 13.9 12.5 12.7  HCT 42.2 38.0 40.2  MCV 96.1 96.2 97.8  PLT 200 195 216   Cardiac Enzymes: No results for input(s): CKTOTAL,  CKMB, CKMBINDEX, TROPONINI in the last 168 hours. BNP: Invalid input(s): POCBNP CBG: Recent Labs  Lab 06/27/19 1719 06/27/19 2008 06/27/19 2351 06/28/19 0424 06/28/19 0739  GLUCAP 131* 84 118* 102* 97   D-Dimer No results for input(s): DDIMER in the last 72 hours. Hgb A1c Recent Labs    06/27/19 0518  HGBA1C 6.5*   Lipid Profile No results for input(s): CHOL, HDL, LDLCALC, TRIG, CHOLHDL, LDLDIRECT in the last 72 hours. Thyroid function studies No results for input(s): TSH, T4TOTAL, T3FREE, THYROIDAB in the last 72 hours.  Invalid input(s): FREET3 Anemia work up No results for input(s): VITAMINB12, FOLATE, FERRITIN, TIBC, IRON, RETICCTPCT in the last 72 hours. Urinalysis    Component Value Date/Time   COLORURINE YELLOW 08/02/2018 1333   APPEARANCEUR CLEAR 08/02/2018 1333   LABSPEC >=1.030 (A) 08/02/2018 1333   PHURINE 6.0 08/02/2018 1333   GLUCOSEU NEGATIVE 08/02/2018 1333   HGBUR NEGATIVE 08/02/2018 1333   BILIRUBINUR NEGATIVE 08/02/2018 1333   KETONESUR TRACE (A) 08/02/2018 1333   PROTEINUR NEGATIVE 04/25/2017 1224   UROBILINOGEN 0.2 08/02/2018 1333   NITRITE NEGATIVE 08/02/2018 1333   LEUKOCYTESUR NEGATIVE 08/02/2018 1333   Sepsis Labs Invalid input(s): PROCALCITONIN,  WBC,  LACTICIDVEN Microbiology Recent Results (from the past 240 hour(s))  Respiratory Panel by RT PCR (Flu A&B,  Covid) - Nasopharyngeal Swab     Status: None   Collection Time: 06/26/19 10:59 AM   Specimen: Nasopharyngeal Swab  Result Value Ref Range Status   SARS Coronavirus 2 by RT PCR NEGATIVE NEGATIVE Final    Comment: (NOTE) SARS-CoV-2 target nucleic acids are NOT DETECTED. The SARS-CoV-2 RNA is generally detectable in upper respiratoy specimens during the acute phase of infection. The lowest concentration of SARS-CoV-2 viral copies this assay can detect is 131 copies/mL. A negative result does not preclude SARS-Cov-2 infection and should not be used as the sole basis for treatment or other patient management decisions. A negative result may occur with  improper specimen collection/handling, submission of specimen other than nasopharyngeal swab, presence of viral mutation(s) within the areas targeted by this assay, and inadequate number of viral copies (<131 copies/mL). A negative result must be combined with clinical observations, patient history, and epidemiological information. The expected result is Negative. Fact Sheet for Patients:  PinkCheek.be Fact Sheet for Healthcare Providers:  GravelBags.it This test is not yet ap proved or cleared by the [place] FDA and  has been authorized for detection and/or diagnosis of SARS-CoV-2 by FDA under an Emergency Use Authorization (EUA). This EUA will remain  in effect (meaning this test can be used) for the duration of the COVID-19 declaration under Section 564(b)(1) of the Act, 21 U.S.C. section 360bbb-3(b)(1), unless the authorization is terminated or revoked sooner.    Influenza A by PCR NEGATIVE NEGATIVE Final   Influenza B by PCR NEGATIVE NEGATIVE Final    Comment: (NOTE) The Xpert Xpress SARS-CoV-2/FLU/RSV assay is intended as an aid in  the diagnosis of influenza from Nasopharyngeal swab specimens and  should not be used as a sole basis for treatment. Nasal washings and   aspirates are unacceptable for Xpert Xpress SARS-CoV-2/FLU/RSV  testing. Fact Sheet for Patients: PinkCheek.be Fact Sheet for Healthcare Providers: GravelBags.it This test is not yet approved or cleared by the [place] FDA and  has been authorized for detection and/or diagnosis of SARS-CoV-2 by  FDA under an Emergency Use Authorization (EUA). This EUA will remain  in effect (meaning this test can be used) for the duration of  the  Covid-19 declaration under Section 564(b)(1) of the Act, 21  U.S.C. section 360bbb-3(b)(1), unless the authorization is  terminated or revoked. Performed at [facility], [place] [address] [place], [place] [address]      Time coordinating discharge: 39 minutes  SIGNED:   Georgette Shell, MD  [facility] 06/28/2019, 8:19 AM Pager   If 7PM-7AM, please contact night-coverage [email or web address] Password [email or web address]

## 2019-06-28 NOTE — Progress Notes (Signed)
Assessment Unchanged. Pt verbalized understanding of dc instructions through teach back including medications to resume, follow up care, and when to call the doctor. Discharged via wc to front entrance accompanied by NT.

## 2019-07-07 ENCOUNTER — Other Ambulatory Visit: Payer: Self-pay | Admitting: Internal Medicine

## 2019-07-07 DIAGNOSIS — F32A Depression, unspecified: Secondary | ICD-10-CM

## 2019-07-07 DIAGNOSIS — F329 Major depressive disorder, single episode, unspecified: Secondary | ICD-10-CM

## 2019-07-09 DIAGNOSIS — M533 Sacrococcygeal disorders, not elsewhere classified: Secondary | ICD-10-CM | POA: Diagnosis not present

## 2019-07-09 DIAGNOSIS — G894 Chronic pain syndrome: Secondary | ICD-10-CM | POA: Diagnosis not present

## 2019-07-09 DIAGNOSIS — M791 Myalgia, unspecified site: Secondary | ICD-10-CM | POA: Diagnosis not present

## 2019-07-09 DIAGNOSIS — M792 Neuralgia and neuritis, unspecified: Secondary | ICD-10-CM | POA: Diagnosis not present

## 2019-07-14 ENCOUNTER — Encounter: Payer: Self-pay | Admitting: Internal Medicine

## 2019-07-16 ENCOUNTER — Other Ambulatory Visit: Payer: Self-pay

## 2019-07-16 ENCOUNTER — Ambulatory Visit (INDEPENDENT_AMBULATORY_CARE_PROVIDER_SITE_OTHER): Payer: Medicare Other | Admitting: Internal Medicine

## 2019-07-16 VITALS — BP 132/70 | HR 54 | Temp 98.3°F | Ht 67.0 in | Wt 133.0 lb

## 2019-07-16 DIAGNOSIS — G894 Chronic pain syndrome: Secondary | ICD-10-CM | POA: Diagnosis not present

## 2019-07-16 DIAGNOSIS — E611 Iron deficiency: Secondary | ICD-10-CM

## 2019-07-16 DIAGNOSIS — H9191 Unspecified hearing loss, right ear: Secondary | ICD-10-CM | POA: Diagnosis not present

## 2019-07-16 DIAGNOSIS — J439 Emphysema, unspecified: Secondary | ICD-10-CM | POA: Diagnosis not present

## 2019-07-16 DIAGNOSIS — J189 Pneumonia, unspecified organism: Secondary | ICD-10-CM | POA: Diagnosis not present

## 2019-07-16 DIAGNOSIS — F32A Depression, unspecified: Secondary | ICD-10-CM

## 2019-07-16 DIAGNOSIS — H9190 Unspecified hearing loss, unspecified ear: Secondary | ICD-10-CM | POA: Insufficient documentation

## 2019-07-16 DIAGNOSIS — F17200 Nicotine dependence, unspecified, uncomplicated: Secondary | ICD-10-CM

## 2019-07-16 DIAGNOSIS — E1165 Type 2 diabetes mellitus with hyperglycemia: Secondary | ICD-10-CM

## 2019-07-16 DIAGNOSIS — F329 Major depressive disorder, single episode, unspecified: Secondary | ICD-10-CM

## 2019-07-16 DIAGNOSIS — J9601 Acute respiratory failure with hypoxia: Secondary | ICD-10-CM

## 2019-07-16 DIAGNOSIS — E538 Deficiency of other specified B group vitamins: Secondary | ICD-10-CM

## 2019-07-16 DIAGNOSIS — E559 Vitamin D deficiency, unspecified: Secondary | ICD-10-CM

## 2019-07-16 MED ORDER — BUPROPION HCL ER (XL) 150 MG PO TB24: 150.0000 mg | ORAL_TABLET | Freq: Every day | ORAL | 3 refills | Status: DC

## 2019-07-16 NOTE — Patient Instructions (Addendum)
Please take all new medication as prescribed - the wellbutrin  Please continue all other medications as before, and refills have been done if requested.  Please have the pharmacy call with any other refills you may need.  Please continue your efforts at being more active, low cholesterol diet, and weight control  Please keep your appointments with your specialists as you may have planned  You will be contacted regarding the referral for: audiology for hearing  Please go to the LAB at the blood drawing area for the tests to be done next week as you mentioned  You will be contacted by phone if any changes need to be made immediately.  Otherwise, you will receive a letter about your results with an explanation, but please check with MyChart first.  Please remember to sign up for MyChart if you have not done so, as this will be important to you in the future with finding out test results, communicating by private email, and scheduling acute appointments online when needed.  Please make an Appointment to return in 3 months, or sooner if needed,

## 2019-07-16 NOTE — Assessment & Plan Note (Addendum)
Resolved,  to f/u any worsening symptoms or concerns  I spent *42 minutes in preparing to see the patient by review of recent labs, imaging and procedures, obtaining and reviewing separately obtained history, communicating with the patient and family or caregiver, ordering medications, tests or procedures, and documenting clinical information in the EHR including the differential Dx, treatment, and any further evaluation and other management of copd, caP, dM< smoker, depression, right hearing loss

## 2019-07-16 NOTE — Assessment & Plan Note (Signed)
Followed per pain management, stable overall by history and exam, recent data reviewed with pt, and pt to continue medical treatment as before,  to f/u any worsening symptoms or concerns

## 2019-07-16 NOTE — Assessment & Plan Note (Signed)
Resolved,  to f/u any worsening symptoms or concerns  

## 2019-07-16 NOTE — Assessment & Plan Note (Signed)
Urged to quit completely

## 2019-07-16 NOTE — Assessment & Plan Note (Signed)
stable overall by history and exam, recent data reviewed with pt, and pt to continue medical treatment as before,  to f/u any worsening symptoms or concerns  

## 2019-07-16 NOTE — Assessment & Plan Note (Signed)
stable overall by history and exam, recent data reviewed with pt, and pt to continue medical treatment as before,  to f/u any worsening symptoms or concerns, for lab f/u next wk per pt rqeust

## 2019-07-16 NOTE — Assessment & Plan Note (Signed)
Worsening right - no wax problem today, for audiology referral

## 2019-07-16 NOTE — Progress Notes (Signed)
Subjective:    Patient ID: Crystal Thornton, female    DOB: April 16, 1945, 74 y.o.   MRN: [id number]  HPI  Here to f/u; overall doing ok,  Pt denies chest pain, increasing sob or doe, wheezing, orthopnea, PND, increased LE swelling, palpitations, dizziness or syncope.  Pt denies new neurological symptoms such as new headache, or facial or extremity weakness or numbness.  Pt denies polydipsia, polyuria, or low sugar episode.  Pt states overall good compliance with meds, mostly trying to follow appropriate diet, with wt overall stable,  but little exercise however. Finished antibx, no fever, still has some non prod cough, no wheezing, now off o2 with recent pna.   Wt Readings from Last 3 Encounters:  07/16/19 133 lb (60.3 kg)  06/27/19 134 lb 4.2 oz (60.9 kg)  02/25/19 135 lb (61.2 kg)  Now down to 1/4 ppd, almost quit smoking.   Remains some depressed, still taking sertraline 200 qd. Also with worsening hearing loss, but not better after right canal wax resolved.   Past Medical History:  Diagnosis Date  . ANGIOEDEMA 01/24/2010  . Anxiety   . ANXIETY 11/07/2006  . BACK PAIN 11/01/2007  . Cough 05/29/2008  . CYST, OVARIAN NEC/NOS 11/05/2006  . Depression   . DEPRESSION 11/07/2006  . Diabetes mellitus   . DIABETES MELLITUS, TYPE II 11/07/2006  . DIVERTICULOSIS, COLON 11/07/2006  . FIBROCYSTIC BREAST DISEASE 11/05/2006  . HEAD TRAUMA, CLOSED 01/24/2010  . HEPATITIS B, HX OF 11/05/2006  . Hepatitis C    Treated and no longer has  . HEPATITIS C 11/05/2006  . Hyperlipidemia   . HYPERLIPIDEMIA 11/07/2006  . Hypertension   . HYPERTENSION 05/29/2007  . INSOMNIA, HX OF 11/05/2006  . LOW BACK PAIN 05/29/2007  . Osteopenia   . OSTEOPENIA 11/07/2006  . Other specified sites of sprains and strains 08/31/2008  . Pain    R. shoulder and lower back and legs  . PULMONARY NODULE, LEFT UPPER LOBE 05/31/2009  . Seizures ([place])   . SHOULDER PAIN, RIGHT, CHRONIC 06/02/2010  . SINUSITIS- ACUTE-NOS 12/10/2007  . SKIN LESION  06/02/2010  . TOTAL ABDOMINAL HYSTERECTOMY, HX OF 03/27/1990  . Wears dentures    full top  . Wears glasses    Past Surgical History:  Procedure Laterality Date  . APPENDECTOMY    . CERVICAL FUSION  2013  . CESAREAN SECTION    . COLONOSCOPY    . EUS N/A 07/29/2015   Procedure: UPPER ENDOSCOPIC ULTRASOUND (EUS) LINEAR;  Surgeon: Milus Banister, MD;  Location: [facility];  Service: Endoscopy;  Laterality: N/A;  . EXTERNAL FIXATION LEG Left 12/12/2012   Procedure: LEFT ADJUSTMENT OF EXTERNAL FIXATION UNDER ANESTHESIA;  Surgeon: Wylene Simmer, MD;  Location: [place];  Service: Orthopedics;  Laterality: Left;  . LIVER BIOPSY    . ORIF ANKLE FRACTURE Left 12/03/2012  . ORIF ANKLE FRACTURE Left 01/02/2013   Procedure: LEFT ANKLE REMOVAL OF EXTERNAL FIXATOR OPEN REDUCTION INTERNAL FIXATION (ORIF) TRIMALLEOLAR FRACTURE;  Surgeon: Wylene Simmer, MD;  Location: [place];  Service: Orthopedics;  Laterality: Left;  . OVARIAN CYST SURGERY     ruptured  . SHOULDER ARTHROSCOPY  2008   left  . TUBAL LIGATION      reports that she has been smoking cigarettes. She has a 12.50 pack-year smoking history. She has never used smokeless tobacco. She reports that she does not drink alcohol or use drugs. family history includes Liver cancer in her mother. Allergies  Allergen Reactions  . Naproxen Sodium Anaphylaxis  . Sulfonamide Derivatives Other (See Comments)    Causes nephritis  . Belsomra [Suvorexant]     To not take per pain management  . Chantix [Varenicline Tartrate] Other (See Comments)  . Clonazepam     To not take per pain management  . Ace Inhibitors Swelling    Lip swelling   Current Outpatient Medications on File Prior to Visit  Medication Sig Dispense Refill  . albuterol (PROVENTIL HFA;VENTOLIN HFA) 108 (90 Base) MCG/ACT inhaler Inhale 2 puffs into the lungs every 6 (six) hours as needed for wheezing or shortness of breath. 3 Inhaler 0  . albuterol (PROVENTIL)  (2.5 MG/3ML) 0.083% nebulizer solution Take 3 mLs (2.5 mg total) by nebulization every 6 (six) hours as needed for wheezing or shortness of breath. DX: J44.9 360 mL 2  . atenolol (TENORMIN) 50 MG tablet TAKE 1 TABLET BY MOUTH DAILY (Patient taking differently: Take 50 mg by mouth daily. ) 90 tablet 2  . Blood Glucose Monitoring Suppl (ONETOUCH VERIO FLEX SYSTEM) w/Device KIT Use as directed daily E11.9 1 kit 0  . Fluticasone-Umeclidin-Vilant (TRELEGY ELLIPTA) 100-62.5-25 MCG/INH AEPB Inhale 1 puff into the lungs daily. 60 each 11  . Ginseng 100 MG CAPS Take 100 mg by mouth daily.    Marland Kitchen glucose blood (ONETOUCH VERIO) test strip Use as instructed daily E11.9 100 each 12  . metFORMIN (GLUCOPHAGE) 500 MG tablet TAKE 2 TABLETS BY MOUTH Twice daily (Patient taking differently: Take 1,000 mg by mouth 2 (two) times daily with a meal. ) 360 tablet 3  . methocarbamol (ROBAXIN) 750 MG tablet Take 750 mg by mouth 3 (three) times daily.    . Misc Natural Products (GINKOGIN PO) Take by mouth.    . Multiple Vitamins-Minerals (CENTRUM SILVER PO) Take 1 tablet by mouth daily.      Marland Kitchen oxycodone (ROXICODONE) 30 MG immediate release tablet Take 30 mg by mouth 4 (four) times daily as needed for pain.    . rosuvastatin (CRESTOR) 40 MG tablet Take 1 tablet (40 mg total) by mouth daily. 90 tablet 3  . sertraline (ZOLOFT) 100 MG tablet TAKE 2 TABLETS(200 MG) BY MOUTH DAILY 180 tablet 1  . cloNIDine (CATAPRES - DOSED IN MG/24 HR) 0.1 mg/24hr patch 0.1 mg once a week.     No current facility-administered medications on file prior to visit.   Review of Systems All otherwise neg per pt     Objective:   Physical Exam BP 132/70 (BP Location: Left Arm, Patient Position: Sitting, Cuff Size: Large)   Pulse (!) 54   Temp 98.3 F (36.8 C) (Oral)   Ht '5\' 7"'  (1.702 m)   Wt 133 lb (60.3 kg)   SpO2 95%   BMI 20.83 kg/m  VS noted,  Constitutional: Pt appears in NAD HENT: Head: NCAT.  Right Ear: External ear normal.  Left  Ear: External ear normal.  Eyes: . Pupils are equal, round, and reactive to light. Conjunctivae and EOM are normal Nose: without d/c or deformity Neck: Neck supple. Gross normal ROM Cardiovascular: Normal rate and regular rhythm.   Pulmonary/Chest: Effort normal and breath sounds without rales or wheezing.  Abd:  Soft, NT, ND, + BS, no organomegaly Neurological: Pt is alert. At baseline orientation, motor grossly intact Skin: Skin is warm. No rashes, other new lesions, no LE edema Psychiatric: Pt behavior is normal without agitation  All otherwise neg per pt Lab Results  Component Value Date  WBC 10.0 06/28/2019   HGB 12.7 06/28/2019   HCT 40.2 06/28/2019   PLT 216 06/28/2019   GLUCOSE 106 (H) 06/28/2019   CHOL 210 (H) 08/02/2018   TRIG 181.0 (H) 08/02/2018   HDL 55.10 08/02/2018   LDLDIRECT 103.0 11/17/2015   LDLCALC 118 (H) 08/02/2018   ALT 15 08/02/2018   AST 16 08/02/2018   NA 142 06/28/2019   K 5.3 (H) 06/28/2019   CL 109 06/28/2019   CREATININE 0.92 06/28/2019   BUN 25 (H) 06/28/2019   CO2 26 06/28/2019   TSH 0.44 08/02/2018   HGBA1C 6.5 (H) 06/27/2019   MICROALBUR 13.4 (H) 06/29/2017         Assessment & Plan:

## 2019-07-16 NOTE — Assessment & Plan Note (Signed)
Mild worsening, no si or hi, for add wellbutrin xl 150 qd, continue counseling as she does

## 2019-07-21 ENCOUNTER — Encounter: Payer: Self-pay | Admitting: Internal Medicine

## 2019-07-29 ENCOUNTER — Ambulatory Visit: Payer: Medicare Other | Admitting: Sports Medicine

## 2019-08-04 DIAGNOSIS — M792 Neuralgia and neuritis, unspecified: Secondary | ICD-10-CM | POA: Diagnosis not present

## 2019-08-04 DIAGNOSIS — S8292XE Unspecified fracture of left lower leg, subsequent encounter for open fracture type I or II with routine healing: Secondary | ICD-10-CM | POA: Diagnosis not present

## 2019-08-04 DIAGNOSIS — M9943 Connective tissue stenosis of neural canal of lumbar region: Secondary | ICD-10-CM | POA: Diagnosis not present

## 2019-08-04 DIAGNOSIS — M25519 Pain in unspecified shoulder: Secondary | ICD-10-CM | POA: Diagnosis not present

## 2019-08-04 DIAGNOSIS — G894 Chronic pain syndrome: Secondary | ICD-10-CM | POA: Diagnosis not present

## 2019-08-04 DIAGNOSIS — M48062 Spinal stenosis, lumbar region with neurogenic claudication: Secondary | ICD-10-CM | POA: Diagnosis not present

## 2019-08-04 DIAGNOSIS — M4726 Other spondylosis with radiculopathy, lumbar region: Secondary | ICD-10-CM | POA: Diagnosis not present

## 2019-08-04 DIAGNOSIS — M9973 Connective tissue and disc stenosis of intervertebral foramina of lumbar region: Secondary | ICD-10-CM | POA: Diagnosis not present

## 2019-08-04 DIAGNOSIS — G8929 Other chronic pain: Secondary | ICD-10-CM | POA: Diagnosis not present

## 2019-08-07 ENCOUNTER — Encounter: Payer: Self-pay | Admitting: Internal Medicine

## 2019-08-07 DIAGNOSIS — L570 Actinic keratosis: Secondary | ICD-10-CM | POA: Diagnosis not present

## 2019-08-07 DIAGNOSIS — C44622 Squamous cell carcinoma of skin of right upper limb, including shoulder: Secondary | ICD-10-CM | POA: Diagnosis not present

## 2019-08-11 ENCOUNTER — Other Ambulatory Visit: Payer: Self-pay

## 2019-08-11 ENCOUNTER — Other Ambulatory Visit (INDEPENDENT_AMBULATORY_CARE_PROVIDER_SITE_OTHER): Payer: Medicare Other

## 2019-08-11 DIAGNOSIS — E611 Iron deficiency: Secondary | ICD-10-CM | POA: Diagnosis not present

## 2019-08-11 DIAGNOSIS — E559 Vitamin D deficiency, unspecified: Secondary | ICD-10-CM | POA: Diagnosis not present

## 2019-08-11 DIAGNOSIS — E1165 Type 2 diabetes mellitus with hyperglycemia: Secondary | ICD-10-CM

## 2019-08-11 DIAGNOSIS — E538 Deficiency of other specified B group vitamins: Secondary | ICD-10-CM | POA: Diagnosis not present

## 2019-08-11 LAB — BASIC METABOLIC PANEL
BUN: 22 mg/dL (ref 6–23)
CO2: 29 mEq/L (ref 19–32)
Calcium: 9.7 mg/dL (ref 8.4–10.5)
Chloride: 105 mEq/L (ref 96–112)
Creatinine, Ser: 0.89 mg/dL (ref 0.40–1.20)
GFR: 62.14 mL/min (ref >60.00)
Glucose, Bld: 128 mg/dL — ABNORMAL HIGH (ref 70–99)
Potassium: 5.2 mEq/L — ABNORMAL HIGH (ref 3.5–5.1)
Sodium: 142 mEq/L (ref 135–145)

## 2019-08-11 LAB — HEPATIC FUNCTION PANEL
ALT: 23 U/L (ref 0–35)
AST: 22 U/L (ref 0–37)
Albumin: 4.2 g/dL (ref 3.5–5.2)
Alkaline Phosphatase: 120 U/L — ABNORMAL HIGH (ref 39–117)
Bilirubin, Direct: 0.1 mg/dL (ref 0.0–0.3)
Total Bilirubin: 0.5 mg/dL (ref 0.2–1.2)
Total Protein: 7.3 g/dL (ref 6.0–8.3)

## 2019-08-11 LAB — LIPID PANEL
Cholesterol: 251 mg/dL — ABNORMAL HIGH (ref 0–200)
HDL: 57.9 mg/dL (ref >39.00)
NonHDL: 192.95
Total CHOL/HDL Ratio: 4
Triglycerides: 316 mg/dL — ABNORMAL HIGH (ref 0.0–149.0)
VLDL: 63.2 mg/dL — ABNORMAL HIGH (ref 0.0–40.0)

## 2019-08-11 LAB — IBC PANEL
Iron: 131 ug/dL (ref 42–145)
Saturation Ratios: 32.8 % (ref 20.0–50.0)
Transferrin: 285 mg/dL (ref 212.0–360.0)

## 2019-08-11 LAB — HEMOGLOBIN A1C: Hgb A1c MFr Bld: 6.7 % — ABNORMAL HIGH (ref 4.6–6.5)

## 2019-08-11 LAB — LDL CHOLESTEROL, DIRECT: Direct LDL: 150 mg/dL

## 2019-08-12 ENCOUNTER — Ambulatory Visit: Payer: Medicare Other | Admitting: Sports Medicine

## 2019-08-12 LAB — VITAMIN B12: Vitamin B-12: 611 pg/mL (ref 211–911)

## 2019-08-12 LAB — VITAMIN D 25 HYDROXY (VIT D DEFICIENCY, FRACTURES): VITD: 40.83 ng/mL (ref 30.00–100.00)

## 2019-08-15 ENCOUNTER — Encounter: Payer: Self-pay | Admitting: Internal Medicine

## 2019-09-01 DIAGNOSIS — G894 Chronic pain syndrome: Secondary | ICD-10-CM | POA: Diagnosis not present

## 2019-09-01 DIAGNOSIS — S8292XE Unspecified fracture of left lower leg, subsequent encounter for open fracture type I or II with routine healing: Secondary | ICD-10-CM | POA: Diagnosis not present

## 2019-09-02 ENCOUNTER — Other Ambulatory Visit: Payer: Self-pay | Admitting: Internal Medicine

## 2019-09-02 DIAGNOSIS — E119 Type 2 diabetes mellitus without complications: Secondary | ICD-10-CM

## 2019-09-02 NOTE — Telephone Encounter (Signed)
Please refill as per office routine med refill policy (all routine meds refilled for 3 mo or monthly per pt preference up to one year from last visit, then month to month grace period for 3 mo, then further med refills will have to be denied)  

## 2019-09-25 NOTE — H&P (Signed)
[facility] History & Physical  [name] MD  Crystal Thornton 06/26/2019  Chief Complaint: Dyspnea HPI: The patient is a 74 y.o. year-old with hx COPD, HTN, hep C, h/o hep B, DM2, depression/ anxiety, chronic pain syndrome brought to ED today 4/1 for SOB/ COPD exacerbation per EMS.  98% on 10L O2 on arrival.  Given IV solumedrol and Zofran en route. Hypoxic O2 sat in 80's on RA in ED, now in 90's w/ [place] O2.  Temp up to 101 deg in ED,BP's wnl, RR up 26 > 22 now. Trop and BNP slightly up , but no CP. Getting albuterol ACT, + IV rocephin and zithromax for suspected PNA.  CXR shows LLL opacities retrocardiac. Asked to see for admission.   Pt describes 2-3 days coughing, worsening SOB primarily.  +prod sputum which is new, no aches or F/C/S, no CP or abd pain, no N/V/D.  No hx heart disease.   Lives home w/ her son, divorced, +tobacco.  Has been eating okay. No confusion.   Pt requests to heroic measures, "let me go in peace".  Last admit in 2019 was for syncope, w/u showed no ACX, echo w/o sig abnormalities, carotid dopplers also neg, orthostatics were negative. Pt was on BB but no bradycardia or hypotension. Meds were continued pt dc'd home.   ROS  denies CP  no joint pain   no HA  no blurry vision  no rash  no diarrhea  no nausea/ vomiting  no dysuria  no difficulty voiding  no change in urine color   Past Medical History  Past Medical History:  Diagnosis Date  . ANGIOEDEMA 01/24/2010  . Anxiety   . ANXIETY 11/07/2006  . BACK PAIN 11/01/2007  . Cough 05/29/2008  . CYST, OVARIAN NEC/NOS 11/05/2006  . Depression   . DEPRESSION 11/07/2006  . Diabetes mellitus   . DIABETES MELLITUS, TYPE II 11/07/2006  . DIVERTICULOSIS, COLON 11/07/2006  . FIBROCYSTIC BREAST DISEASE 11/05/2006  . HEAD TRAUMA, CLOSED 01/24/2010  . HEPATITIS B, HX OF 11/05/2006  . Hepatitis C    Treated and no longer has  . HEPATITIS C 11/05/2006  . Hyperlipidemia   . HYPERLIPIDEMIA 11/07/2006  .  Hypertension   . HYPERTENSION 05/29/2007  . INSOMNIA, HX OF 11/05/2006  . LOW BACK PAIN 05/29/2007  . Osteopenia   . OSTEOPENIA 11/07/2006  . Other specified sites of sprains and strains 08/31/2008  . Pain    R. shoulder and lower back and legs  . PULMONARY NODULE, LEFT UPPER LOBE 05/31/2009  . Seizures ([place])   . SHOULDER PAIN, RIGHT, CHRONIC 06/02/2010  . SINUSITIS- ACUTE-NOS 12/10/2007  . SKIN LESION 06/02/2010  . TOTAL ABDOMINAL HYSTERECTOMY, HX OF 03/27/1990  . Wears dentures    full top  . Wears glasses    Past Surgical History  Past Surgical History:  Procedure Laterality Date  . APPENDECTOMY    . CERVICAL FUSION  2013  . CESAREAN SECTION    . COLONOSCOPY    . EUS N/A 07/29/2015   Procedure: UPPER ENDOSCOPIC ULTRASOUND (EUS) LINEAR;  Surgeon: Milus Banister, MD;  Location: [facility];  Service: Endoscopy;  Laterality: N/A;  . EXTERNAL FIXATION LEG Left 12/12/2012   Procedure: LEFT ADJUSTMENT OF EXTERNAL FIXATION UNDER ANESTHESIA;  Surgeon: Wylene Simmer, MD;  Location: [place];  Service: Orthopedics;  Laterality: Left;  . LIVER BIOPSY    . ORIF ANKLE FRACTURE Left 12/03/2012  . ORIF ANKLE FRACTURE Left 01/02/2013  Procedure: LEFT ANKLE REMOVAL OF EXTERNAL FIXATOR OPEN REDUCTION INTERNAL FIXATION (ORIF) TRIMALLEOLAR FRACTURE;  Surgeon: Wylene Simmer, MD;  Location: [place];  Service: Orthopedics;  Laterality: Left;  . OVARIAN CYST SURGERY     ruptured  . SHOULDER ARTHROSCOPY  2008   left  . TUBAL LIGATION     Family History  Family History  Problem Relation Age of Onset  . Liver cancer Mother   . Breast cancer Neg Hx    Social History  reports that she has been smoking cigarettes. She has a 12.50 pack-year smoking history. She has never used smokeless tobacco. She reports that she does not drink alcohol and does not use drugs. Allergies  Allergies  Allergen Reactions  . Naproxen Sodium Anaphylaxis  . Sulfonamide Derivatives Other (See Comments)     Causes nephritis  . Belsomra [Suvorexant]     To not take per pain management  . Chantix [Varenicline Tartrate] Other (See Comments)  . Clonazepam     To not take per pain management  . Ace Inhibitors Swelling    Lip swelling   Home medications Prior to Admission medications   Medication Sig Start Date End Date Taking? Authorizing Provider  albuterol (PROVENTIL HFA;VENTOLIN HFA) 108 (90 Base) MCG/ACT inhaler Inhale 2 puffs into the lungs every 6 (six) hours as needed for wheezing or shortness of breath. 05/13/18  Yes Collene Gobble, MD  albuterol (PROVENTIL) (2.5 MG/3ML) 0.083% nebulizer solution Take 3 mLs (2.5 mg total) by nebulization every 6 (six) hours as needed for wheezing or shortness of breath. DX: J44.9 02/25/19  Yes Collene Gobble, MD  atenolol (TENORMIN) 50 MG tablet TAKE 1 TABLET BY MOUTH DAILY Patient taking differently: Take 50 mg by mouth daily.  05/22/19  Yes Biagio Borg, MD  Fluticasone-Umeclidin-Vilant (TRELEGY ELLIPTA) 100-62.5-25 MCG/INH AEPB Inhale 1 puff into the lungs daily. 04/17/19  Yes Collene Gobble, MD  Ginseng 100 MG CAPS Take 100 mg by mouth daily.   Yes [provider]  methocarbamol (ROBAXIN) 750 MG tablet Take 750 mg by mouth 3 (three) times daily. 02/26/19  Yes [provider]  Misc Natural Products (GINKOGIN PO) Take by mouth.   Yes [provider]  Multiple Vitamins-Minerals (CENTRUM SILVER PO) Take 1 tablet by mouth daily.     Yes [provider]  oxycodone (ROXICODONE) 30 MG immediate release tablet Take 30 mg by mouth 4 (four) times daily as needed for pain. 06/11/19  Yes [provider]  rosuvastatin (CRESTOR) 40 MG tablet Take 1 tablet (40 mg total) by mouth daily. 10/01/17  Yes Biagio Borg, MD  Blood Glucose Monitoring Suppl ([place]) w/Device KIT Use as directed daily E11.9 10/31/18   Biagio Borg, MD  buPROPion (WELLBUTRIN XL) 150 MG 24 hr tablet Take 1 tablet (150 mg total) by mouth  daily. 07/16/19   Biagio Borg, MD  cloNIDine (CATAPRES - DOSED IN MG/24 HR) 0.1 mg/24hr patch 0.1 mg once a week. 07/09/19   [provider]  glucose blood (ONETOUCH VERIO) test strip Use as instructed daily E11.9 10/31/18   Biagio Borg, MD  metFORMIN (GLUCOPHAGE) 500 MG tablet TAKE 2 TABLETS BY MOUTH TWICE DAILY 09/03/19   Biagio Borg, MD  sertraline (ZOLOFT) 100 MG tablet TAKE 2 TABLETS(200 MG) BY MOUTH DAILY 07/08/19   Biagio Borg, MD      Exam: Gen alert pleasant WF not in distress, nasal O2, coughing No rash,  cyanosis or gangrene Sclera anicteric, throat clear  No jvd or bruits Chest clear on R , large crackles L base, no wheezing, good air movement in general RRR no MRG Abd soft ntnd no mass or ascites +bs GU deferred MS no joint effusions or deformity Ext trace pretib edema, no wounds or ulcers Neuro is alert, Ox 3 , nf    Home meds:  - atenolol 50 qd  - rosuvastatin 40  - sertraline 200 qd  - prn lunesta hs/ oxycodone IR 22m qid prn/ prn robaxin  - trelegy ellipta qd  - metformin 1gm bid   - prn's/ vitamins/ supplements     CXR 4/1 > IMPRESSION: ...the lungs remain hyperinflated with emphysematous changes and chronically coarsened interstitial markings. Superimposed asymmetric interstitial thickening and hazy opacities at the left lung base   COVID-19 PCR neg, flu A/b neg  Assessment/ Plan: 1. Comm- acquired PNA - w/ 2-3 days cough and worsening SOB, underlying COPD.  ^temp/ wbc / LLL infiltrate on CXR.  Admit, IV abx and IVF's, O2 support, full liquid diet.  COVID testing is negative, +pt immunized fully.  2. HTN - cont atenolol, BP's and HR are wnl 3. Depression - cont zoloft 4. COPD - not on O2 at home 5. DM2 - SSI here, hold metformin 6. DNR - pt request, confirmed w/ son     RKelly Splinter MD 06/26/2019, 12:33 PM

## 2019-09-29 DIAGNOSIS — M4322 Fusion of spine, cervical region: Secondary | ICD-10-CM | POA: Diagnosis not present

## 2019-09-29 DIAGNOSIS — G894 Chronic pain syndrome: Secondary | ICD-10-CM | POA: Diagnosis not present

## 2019-09-29 DIAGNOSIS — M791 Myalgia, unspecified site: Secondary | ICD-10-CM | POA: Diagnosis not present

## 2019-10-22 ENCOUNTER — Encounter: Payer: Self-pay | Admitting: Internal Medicine

## 2019-10-23 ENCOUNTER — Other Ambulatory Visit: Payer: Self-pay

## 2019-10-23 MED ORDER — BUPROPION HCL ER (XL) 150 MG PO TB24: 150.0000 mg | ORAL_TABLET | Freq: Every day | ORAL | 3 refills | Status: DC

## 2019-10-27 ENCOUNTER — Telehealth: Payer: Self-pay | Admitting: Internal Medicine

## 2019-10-27 DIAGNOSIS — G8929 Other chronic pain: Secondary | ICD-10-CM

## 2019-10-27 NOTE — Telephone Encounter (Signed)
Ok this is done 

## 2019-10-27 NOTE — Telephone Encounter (Signed)
    Patient calling to request referral to [facility] Phone [phone]

## 2019-10-27 NOTE — Telephone Encounter (Signed)
Sent to Dr. John. 

## 2019-12-31 ENCOUNTER — Emergency Department (HOSPITAL_COMMUNITY)
Admission: EM | Admit: 2019-12-31 | Discharge: 2020-01-01 | Disposition: A | Discharge status: Home / Self Care | Payer: Medicare Other | Attending: Emergency Medicine | Admitting: Emergency Medicine

## 2019-12-31 ENCOUNTER — Other Ambulatory Visit: Payer: Self-pay

## 2019-12-31 ENCOUNTER — Encounter (HOSPITAL_COMMUNITY): Payer: Self-pay

## 2019-12-31 DIAGNOSIS — I10 Essential (primary) hypertension: Secondary | ICD-10-CM | POA: Diagnosis not present

## 2019-12-31 DIAGNOSIS — Z7951 Long term (current) use of inhaled steroids: Secondary | ICD-10-CM | POA: Insufficient documentation

## 2019-12-31 DIAGNOSIS — J441 Chronic obstructive pulmonary disease with (acute) exacerbation: Secondary | ICD-10-CM | POA: Insufficient documentation

## 2019-12-31 DIAGNOSIS — F17210 Nicotine dependence, cigarettes, uncomplicated: Secondary | ICD-10-CM | POA: Diagnosis not present

## 2019-12-31 DIAGNOSIS — Z7984 Long term (current) use of oral hypoglycemic drugs: Secondary | ICD-10-CM | POA: Diagnosis not present

## 2019-12-31 DIAGNOSIS — E119 Type 2 diabetes mellitus without complications: Secondary | ICD-10-CM | POA: Diagnosis not present

## 2019-12-31 DIAGNOSIS — Z20822 Contact with and (suspected) exposure to covid-19: Secondary | ICD-10-CM | POA: Insufficient documentation

## 2019-12-31 DIAGNOSIS — Z79899 Other long term (current) drug therapy: Secondary | ICD-10-CM | POA: Insufficient documentation

## 2019-12-31 DIAGNOSIS — R0602 Shortness of breath: Secondary | ICD-10-CM | POA: Diagnosis not present

## 2019-12-31 DIAGNOSIS — J449 Chronic obstructive pulmonary disease, unspecified: Secondary | ICD-10-CM | POA: Diagnosis not present

## 2019-12-31 LAB — CBC
HCT: 42.3 % (ref 36.0–46.0)
Hemoglobin: 14.1 g/dL (ref 12.0–15.0)
MCH: 31.8 pg (ref 26.0–34.0)
MCHC: 33.3 g/dL (ref 30.0–36.0)
MCV: 95.3 fL (ref 80.0–100.0)
Platelets: 241 10*3/uL (ref 150–400)
RBC: 4.44 MIL/uL (ref 3.87–5.11)
RDW: 13.4 % (ref 11.5–15.5)
WBC: 7.4 10*3/uL (ref 4.0–10.5)
nRBC: 0 % (ref 0.0–0.2)

## 2019-12-31 LAB — BASIC METABOLIC PANEL
Anion gap: 10 (ref 5–15)
BUN: 19 mg/dL (ref 8–23)
CO2: 26 mmol/L (ref 22–32)
Calcium: 9.5 mg/dL (ref 8.9–10.3)
Chloride: 104 mmol/L (ref 98–111)
Creatinine, Ser: 0.72 mg/dL (ref 0.44–1.00)
GFR calc non Af Amer: >60 mL/min (ref >60)
Glucose, Bld: 84 mg/dL (ref 70–99)
Potassium: 4.1 mmol/L (ref 3.5–5.1)
Sodium: 140 mmol/L (ref 135–145)

## 2019-12-31 LAB — RESPIRATORY PANEL BY RT PCR (FLU A&B, COVID)
Influenza A by PCR: NEGATIVE
Influenza B by PCR: NEGATIVE
SARS Coronavirus 2 by RT PCR: NEGATIVE

## 2019-12-31 MED ORDER — ALBUTEROL SULFATE (2.5 MG/3ML) 0.083% IN NEBU
5.0000 mg | INHALATION_SOLUTION | Freq: Once | RESPIRATORY_TRACT | Status: AC
  Administered 2019-12-31: 5 mg via RESPIRATORY_TRACT
  Filled 2019-12-31: qty 6

## 2019-12-31 MED ORDER — ACETAMINOPHEN 325 MG PO TABS
650.0000 mg | ORAL_TABLET | Freq: Once | ORAL | Status: AC
  Administered 2019-12-31: 650 mg via ORAL
  Filled 2019-12-31: qty 2

## 2019-12-31 NOTE — ED Notes (Addendum)
Pt with increased SOB and wheezing in waiting room, requesting something for a headache

## 2019-12-31 NOTE — ED Triage Notes (Signed)
Pt reports that she has COPD and she has been having increased SOB for the past week, has been out on her inhaler for the past two weeks due to insurance. Pt has had recent exposure to covid but is vaccinated, pt has expiratory wheezing.

## 2020-01-01 ENCOUNTER — Emergency Department (HOSPITAL_COMMUNITY): Payer: Medicare Other

## 2020-01-01 DIAGNOSIS — J449 Chronic obstructive pulmonary disease, unspecified: Secondary | ICD-10-CM | POA: Diagnosis not present

## 2020-01-01 DIAGNOSIS — R0602 Shortness of breath: Secondary | ICD-10-CM | POA: Diagnosis not present

## 2020-01-01 MED ORDER — ALBUTEROL SULFATE HFA 108 (90 BASE) MCG/ACT IN AERS
2.0000 | INHALATION_SPRAY | Freq: Once | RESPIRATORY_TRACT | Status: AC
  Administered 2020-01-01: 2 via RESPIRATORY_TRACT
  Filled 2020-01-01: qty 6.7

## 2020-01-01 MED ORDER — DOXYCYCLINE HYCLATE 100 MG PO CAPS: 100.0000 mg | ORAL_CAPSULE | Freq: Two times a day (BID) | ORAL | 0 refills | Status: DC

## 2020-01-01 MED ORDER — IPRATROPIUM-ALBUTEROL 0.5-2.5 (3) MG/3ML IN SOLN
3.0000 mL | Freq: Once | RESPIRATORY_TRACT | Status: AC
  Administered 2020-01-01: 3 mL via RESPIRATORY_TRACT
  Filled 2020-01-01: qty 3

## 2020-01-01 MED ORDER — ALBUTEROL (5 MG/ML) CONTINUOUS INHALATION SOLN
10.0000 mg/h | INHALATION_SOLUTION | Freq: Once | RESPIRATORY_TRACT | Status: AC
  Administered 2020-01-01: 10 mg/h via RESPIRATORY_TRACT
  Filled 2020-01-01: qty 40

## 2020-01-01 MED ORDER — DEXAMETHASONE SODIUM PHOSPHATE 10 MG/ML IJ SOLN
10.0000 mg | Freq: Once | INTRAMUSCULAR | Status: AC
  Administered 2020-01-01: 10 mg via INTRAVENOUS
  Filled 2020-01-01: qty 1

## 2020-01-01 NOTE — ED Provider Notes (Signed)
[facility] EMERGENCY DEPARTMENT Provider Note   CSN: [id number] Arrival date & time: 12/31/19  1918     History Chief Complaint  Patient presents with  . COPD    Crystal Thornton is a 74 y.o. female.  HPI     This is a 74 year old female with a history of diabetes, COPD, hypertension who presents with shortness of breath.  Patient reports that she ran out of one of her inhalers 2 weeks ago.  She states that she ran out of her Trelegy.  She has had difficulty obtaining it from the pharmacy because of insurance issues.  Over the last week she has had increasing shortness of breath.  She reports a nonproductive cough.  No fevers.  She reports that a distant acquaintance has tested positive for Covid.  She is fully vaccinated.  She denies any loss of sense of taste and smell.  She denies chest pain, orthopnea, lower extremity swelling.  She has been using her rescue inhaler with minimal relief.  Past Medical History:  Diagnosis Date  . ANGIOEDEMA 01/24/2010  . Anxiety   . ANXIETY 11/07/2006  . BACK PAIN 11/01/2007  . Cough 05/29/2008  . CYST, OVARIAN NEC/NOS 11/05/2006  . Depression   . DEPRESSION 11/07/2006  . Diabetes mellitus   . DIABETES MELLITUS, TYPE II 11/07/2006  . DIVERTICULOSIS, COLON 11/07/2006  . FIBROCYSTIC BREAST DISEASE 11/05/2006  . HEAD TRAUMA, CLOSED 01/24/2010  . HEPATITIS B, HX OF 11/05/2006  . Hepatitis C    Treated and no longer has  . HEPATITIS C 11/05/2006  . Hyperlipidemia   . HYPERLIPIDEMIA 11/07/2006  . Hypertension   . HYPERTENSION 05/29/2007  . INSOMNIA, HX OF 11/05/2006  . LOW BACK PAIN 05/29/2007  . Osteopenia   . OSTEOPENIA 11/07/2006  . Other specified sites of sprains and strains 08/31/2008  . Pain    R. shoulder and lower back and legs  . PULMONARY NODULE, LEFT UPPER LOBE 05/31/2009  . Seizures ([place])   . SHOULDER PAIN, RIGHT, CHRONIC 06/02/2010  . SINUSITIS- ACUTE-NOS 12/10/2007  . SKIN LESION 06/02/2010  . TOTAL ABDOMINAL HYSTERECTOMY, HX  OF 03/27/1990  . Wears dentures    full top  . Wears glasses     Patient Active Problem List   Diagnosis Date Noted  . Hearing loss 07/16/2019  . Community acquired pneumonia 06/26/2019  . Acute respiratory failure with hypoxia ([place]) 06/26/2019  . Syncope 04/24/2017  . RMSF [facility] spotted fever) 12/25/2016  . COPD (chronic obstructive pulmonary disease) ([place]) 11/16/2016  . Rash 11/16/2016  . Choledocholithiasis 06/30/2015  . Urinary urgency 06/24/2015  . Right flank pain 06/24/2015  . RUQ pain 06/24/2015  . Delusions ([place]) 11/03/2014  . Hallucinations 11/03/2014  . Chronic pain syndrome   . Seizure ([place]) 11/01/2014  . Smoker 08/12/2014  . Encounter for well adult exam with abnormal findings 11/02/2010  . Disorder of skin or subcutaneous tissue 06/02/2010  . SHOULDER PAIN, RIGHT, CHRONIC 06/02/2010  . HEAD TRAUMA, CLOSED 01/24/2010  . ANGIOEDEMA 01/24/2010  . Pulmonary nodule, left 05/31/2009  . Other specified sites of sprains and strains 08/31/2008  . Cough 05/29/2008  . BACK PAIN 11/01/2007  . Essential hypertension 05/29/2007  . LOW BACK PAIN 05/29/2007  . Diabetes mellitus type 2, uncontrolled ([place]) 11/07/2006  . Hyperlipidemia 11/07/2006  . Anxiety state 11/07/2006  . Depression 11/07/2006  . DIVERTICULOSIS, COLON 11/07/2006  . OSTEOPENIA 11/07/2006  . HEPATITIS C 11/05/2006  . FIBROCYSTIC BREAST DISEASE 11/05/2006  .  CYST, OVARIAN NEC/NOS 11/05/2006  . HEPATITIS B, HX OF 11/05/2006  . Insomnia 11/05/2006  . TOTAL ABDOMINAL HYSTERECTOMY, HX OF 03/27/1990    Past Surgical History:  Procedure Laterality Date  . APPENDECTOMY    . CERVICAL FUSION  2013  . CESAREAN SECTION    . COLONOSCOPY    . EUS N/A 07/29/2015   Procedure: UPPER ENDOSCOPIC ULTRASOUND (EUS) LINEAR;  Surgeon: Milus Banister, MD;  Location: [facility];  Service: Endoscopy;  Laterality: N/A;  . EXTERNAL FIXATION LEG Left 12/12/2012   Procedure: LEFT ADJUSTMENT OF EXTERNAL FIXATION UNDER  ANESTHESIA;  Surgeon: Wylene Simmer, MD;  Location: [place];  Service: Orthopedics;  Laterality: Left;  . LIVER BIOPSY    . ORIF ANKLE FRACTURE Left 12/03/2012  . ORIF ANKLE FRACTURE Left 01/02/2013   Procedure: LEFT ANKLE REMOVAL OF EXTERNAL FIXATOR OPEN REDUCTION INTERNAL FIXATION (ORIF) TRIMALLEOLAR FRACTURE;  Surgeon: Wylene Simmer, MD;  Location: [place];  Service: Orthopedics;  Laterality: Left;  . OVARIAN CYST SURGERY     ruptured  . SHOULDER ARTHROSCOPY  2008   left  . TUBAL LIGATION       OB History   No obstetric history on file.     Family History  Problem Relation Age of Onset  . Liver cancer Mother   . Breast cancer Neg Hx     Social History   Tobacco Use  . Smoking status: Current Every Day Smoker    Packs/day: 0.25    Years: 50.00    Pack years: 12.50    Types: Cigarettes  . Smokeless tobacco: Never Used  Vaping Use  . Vaping Use: Never used  Substance Use Topics  . Alcohol use: No  . Drug use: No    Home Medications Prior to Admission medications   Medication Sig Start Date End Date Taking? Authorizing Provider  albuterol (PROVENTIL HFA;VENTOLIN HFA) 108 (90 Base) MCG/ACT inhaler Inhale 2 puffs into the lungs every 6 (six) hours as needed for wheezing or shortness of breath. 05/13/18   Collene Gobble, MD  albuterol (PROVENTIL) (2.5 MG/3ML) 0.083% nebulizer solution Take 3 mLs (2.5 mg total) by nebulization every 6 (six) hours as needed for wheezing or shortness of breath. DX: J44.9 02/25/19   Collene Gobble, MD  atenolol (TENORMIN) 50 MG tablet TAKE 1 TABLET BY MOUTH DAILY Patient taking differently: Take 50 mg by mouth daily.  05/22/19   Biagio Borg, MD  Blood Glucose Monitoring Suppl ([place]) w/Device KIT Use as directed daily E11.9 10/31/18   Biagio Borg, MD  buPROPion (WELLBUTRIN XL) 150 MG 24 hr tablet Take 1 tablet (150 mg total) by mouth daily. 10/23/19   Biagio Borg, MD  cloNIDine (CATAPRES -  DOSED IN MG/24 HR) 0.1 mg/24hr patch 0.1 mg once a week. 07/09/19   [provider]  doxycycline (VIBRAMYCIN) 100 MG capsule Take 1 capsule (100 mg total) by mouth 2 (two) times daily. 01/01/20   , Barbette Hair, MD  Fluticasone-Umeclidin-Vilant (TRELEGY ELLIPTA) 100-62.5-25 MCG/INH AEPB Inhale 1 puff into the lungs daily. 04/17/19   Collene Gobble, MD  Ginseng 100 MG CAPS Take 100 mg by mouth daily.    [provider]  glucose blood (ONETOUCH VERIO) test strip Use as instructed daily E11.9 10/31/18   Biagio Borg, MD  metFORMIN (GLUCOPHAGE) 500 MG tablet TAKE 2 TABLETS BY MOUTH TWICE DAILY 09/03/19   Biagio Borg, MD  methocarbamol (ROBAXIN) 750  MG tablet Take 750 mg by mouth 3 (three) times daily. 02/26/19   [provider]  Misc Natural Products (GINKOGIN PO) Take by mouth.    [provider]  Multiple Vitamins-Minerals (CENTRUM SILVER PO) Take 1 tablet by mouth daily.      [provider]  oxycodone (ROXICODONE) 30 MG immediate release tablet Take 30 mg by mouth 4 (four) times daily as needed for pain. 06/11/19   [provider]  rosuvastatin (CRESTOR) 40 MG tablet Take 1 tablet (40 mg total) by mouth daily. 10/01/17   Biagio Borg, MD  sertraline (ZOLOFT) 100 MG tablet TAKE 2 TABLETS(200 MG) BY MOUTH DAILY 07/08/19   Biagio Borg, MD    Allergies    Naproxen sodium, Sulfonamide derivatives, Belsomra [suvorexant], Chantix [varenicline tartrate], Clonazepam, and Ace inhibitors  Review of Systems   Review of Systems  Constitutional: Negative for fever.  Respiratory: Positive for cough and shortness of breath.   Cardiovascular: Negative for chest pain and leg swelling.  Gastrointestinal: Negative for abdominal pain.  Genitourinary: Negative for dysuria.  All other systems reviewed and are negative.   Physical Exam Updated Vital Signs BP (!) 143/71   Pulse 97   Temp 98.4 F (36.9 C) (Oral)   Resp 19   SpO2 (!) 89%   Physical  Exam Vitals and nursing note reviewed.  Constitutional:      Appearance: She is well-developed. She is not ill-appearing.     Comments: Chronically ill-appearing, nontoxic  HENT:     Head: Normocephalic and atraumatic.     Mouth/Throat:     Mouth: Mucous membranes are moist.  Eyes:     Pupils: Pupils are equal, round, and reactive to light.  Cardiovascular:     Rate and Rhythm: Normal rate and regular rhythm.     Heart sounds: Normal heart sounds.  Pulmonary:     Effort: Pulmonary effort is normal. No respiratory distress.     Breath sounds: Wheezing present.     Comments: Wheezing in all lung fields, fair air movement Abdominal:     Palpations: Abdomen is soft.     Tenderness: There is no abdominal tenderness.  Musculoskeletal:     Cervical back: Neck supple.     Right lower leg: No edema.     Left lower leg: No edema.  Skin:    General: Skin is warm and dry.  Neurological:     Mental Status: She is alert and oriented to person, place, and time.  Psychiatric:        Mood and Affect: Mood normal.     ED Results / Procedures / Treatments   Labs (all labs ordered are listed, but only abnormal results are displayed) Labs Reviewed  RESPIRATORY PANEL BY RT PCR (FLU A&B, COVID)  BASIC METABOLIC PANEL  CBC    EKG EKG Interpretation  Date/Time:  Wednesday December 31 2019 19:38:23 EDT Ventricular Rate:  80 PR Interval:  152 QRS Duration: 96 QT Interval:  400 QTC Calculation: 461 R Axis:   -11 Text Interpretation: Normal sinus rhythm Incomplete right bundle branch block Borderline ECG Confirmed by Thayer Jew [phone]) on 01/01/2020 12:29:40 AM   Radiology DG Chest Portable 1 View  Result Date: 01/01/2020 CLINICAL DATA:  Shortness of breath and COPD EXAM: PORTABLE CHEST 1 VIEW COMPARISON:  June 26, 2019 FINDINGS: The heart size and mediastinal contours are within normal limits. Aortic knob calcifications are seen. Mildly increased interstitial markings seen at both  lung bases  as on prior exam. There is hyperinflation of the upper lung zones. Cervical fixation hardware is noted. IMPRESSION: No active disease.  Findings of COPD. Electronically Signed   By: Prudencio Pair M.D.   On: 01/01/2020 00:51    Procedures Procedures (including critical care time)   CRITICAL CARE Performed by: Merryl Hacker   Total critical care time: 50 minutes  Critical care time was exclusive of separately billable procedures and treating other patients.  Critical care was necessary to treat or prevent imminent or life-threatening deterioration.  Critical care was time spent personally by me on the following activities: development of treatment plan with patient and/or surrogate as well as nursing, discussions with consultants, evaluation of patient's response to treatment, examination of patient, obtaining history from patient or surrogate, ordering and performing treatments and interventions, ordering and review of laboratory studies, ordering and review of radiographic studies, pulse oximetry and re-evaluation of patient's condition.  Medications Ordered in ED Medications  albuterol (PROVENTIL) (2.5 MG/3ML) 0.083% nebulizer solution 5 mg (5 mg Nebulization Given 12/31/19 2253)  acetaminophen (TYLENOL) tablet 650 mg (650 mg Oral Given 12/31/19 2254)  dexamethasone (DECADRON) injection 10 mg (10 mg Intravenous Given 01/01/20 0109)  ipratropium-albuterol (DUONEB) 0.5-2.5 (3) MG/3ML nebulizer solution 3 mL (3 mLs Nebulization Given 01/01/20 0109)  albuterol (PROVENTIL,VENTOLIN) solution continuous neb (10 mg/hr Nebulization Given 01/01/20 0226)  albuterol (VENTOLIN HFA) 108 (90 Base) MCG/ACT inhaler 2 puff (2 puffs Inhalation Given 01/01/20 0535)    ED Course  I have reviewed the triage vital signs and the nursing notes.  Pertinent labs & imaging results that were available during my care of the patient were reviewed by me and considered in my medical decision making (see  chart for details).  Clinical Course as of Jan 01 619  Thu Jan 01, 2020  0218 On recheck, patient continues to have expiratory wheezing.  O2 sats 89%.  She is in no respiratory distress.  Will trial with a continuous DuoNeb.   [CH]  0600 Patient O2 sats 89 to 92% with ambulation.  She reports overall improvement.  She does have continued slight expiratory wheezing.  Inhaler ordered for patient.   [CH]    Clinical Course User Index [CH] , Barbette Hair, MD   MDM Rules/Calculators/A&P                          Patient presents with shortness of breath.  She is ill-appearing but nontoxic vital signs are notable for marginal O2 sats in the low 90s.  She is in no respiratory distress.  She is wheezing on exam.  Reports she does not have a rescue inhaler and has not been on her maintenance inhaler.  Patient was given a DuoNeb.  X-ray obtained and shows no evidence of pneumothorax or pneumonia.  Covid testing is negative.  Patient was given 10 mg of Decadron for presumed COPD exacerbation.  On recheck, she had continued wheezing and marginal O2 sats.  Continuous nebulizer ordered.  Patient gradually improved on multiple rechecks.  She was able to ambulate and maintain pulse ox greater than 89%.  She endorsed improvement.  Will provide with a rescue inhaler and will place on doxycycline for presumed COPD exacerbation.  She does not appear volume overloaded and have lower suspicion for etiology such as heart failure.  After history, exam, and medical workup I feel the patient has been appropriately medically screened and is safe for discharge home. Pertinent diagnoses were  discussed with the patient. Patient was given return precautions.  Crystal Thornton was evaluated in Emergency Department on 01/01/2020 for the symptoms described in the history of present illness. She was evaluated in the context of the global COVID-19 pandemic, which necessitated consideration that the patient might be at risk for  infection with the SARS-CoV-2 virus that causes COVID-19. Institutional protocols and algorithms that pertain to the evaluation of patients at risk for COVID-19 are in a state of rapid change based on information released by regulatory bodies including the CDC and federal and state organizations. These policies and algorithms were followed during the patient's care in the ED.    Final Clinical Impression(s) / ED Diagnoses Final diagnoses:  COPD exacerbation ([place])    Rx / DC Orders ED Discharge Orders         Ordered    doxycycline (VIBRAMYCIN) 100 MG capsule  2 times daily        01/01/20 0619           Ramesh Moan, Barbette Hair, MD 01/01/20 [phone]

## 2020-01-01 NOTE — ED Notes (Signed)
Pt ambulated approx. 30', oxygen between 89-92% on room air. Pt complained of feeling very dizzy. Turned pt around and returned pt safely to bed with second tech assisting

## 2020-01-01 NOTE — Discharge Instructions (Addendum)
You were seen today for shortness of breath.  You are likely having a COPD exacerbation.  You were given steroids and frequent nebulizers and improved.  Use the inhaler every 4 hours as needed for shortness of breath.  Follow-up with your primary physician.  Take antibiotics as prescribed.

## 2020-01-01 NOTE — Progress Notes (Signed)
Patient asleep O2 saturation 88-89%.  RT placed patient on 3L [place].  Vitals stable.  RT will continue to monitor.

## 2020-01-02 ENCOUNTER — Telehealth: Payer: Self-pay | Admitting: Emergency Medicine

## 2020-01-02 MED ORDER — PREDNISONE 10 MG PO TABS: ORAL_TABLET | ORAL | 0 refills | Status: DC

## 2020-01-02 MED ORDER — DOXYCYCLINE HYCLATE 100 MG PO TABS: 100.0000 mg | ORAL_TABLET | Freq: Two times a day (BID) | ORAL | 0 refills | Status: DC

## 2020-01-02 NOTE — Telephone Encounter (Signed)
Pt having difficulty breathing. Pt just got home freom the hospital due to breathing issues. Pt been using nebulizer with no relief. Pt sounds very short of breath. Pt out of Trelegy(been out since August) wanting to know if there's something else she can take. Pt unable to afford Trelegy. Please advise [phone]

## 2020-01-02 NOTE — Telephone Encounter (Signed)
Rx for Doxy sent and I spoke with the pt and notified that this was done

## 2020-01-02 NOTE — Telephone Encounter (Signed)
Her COVID was negative. She needs to social distance from any potential exposures, her granddaughter, etc.   Have her take the doxy as ordered by ED  Add pred taper >> Take 40mg  daily for 3 days, then 30mg  daily for 3 days, then 20mg  daily for 3 days, then 10mg  daily for 3 days, then stop  Make sure she has rescue BD available at home  She needs to be back on scheduled BD >> if Trelegy unaffordable then see if we have samples of it or any alternatives >> stiolto, Anoro, bevespi, breztri would all be ok short term substitutes for now.   Long term agree that we need her formulary or a test claim to see which BD to order.   She needs OV with [place] in near future to sort out  If she worsens in any way she is going to have to go back to ED

## 2020-01-02 NOTE — Telephone Encounter (Signed)
We can order it.  Order as doxycycline 100 mg twice a day for 7 days.

## 2020-01-02 NOTE — Telephone Encounter (Signed)
Spoke with patient. She is aware of RB's recommendations. She checked to see why she hadn't received a text about her doxy. It was never sent it. They gave her a paper prescription. She wants to know if RB would be ok with ordering the doxy electronically.   RX was written as Doxycycline 100mg , take 1 capsule by mouth twice daily for 10 days.   RB, please advise. Thanks!

## 2020-01-02 NOTE — Telephone Encounter (Signed)
Spoke to patient, who reports of increased sob, prod cough with yellow mucus and wheezing x4d.  Denied fever, chills or sweats.  She was seen at ED on 12/31/2019 and was prescribed doxy. she has not started doxy, as she has not picked it up form the pharmacy due to transportation issues.  Covid negative at ED.  Using albuterol nebulizer every hour with no relief in sx.  She has had both coivd vaccines.  She stated that her granddaughter whom lives with her has been exposed to covid. I recommended ED, due to audible wheezing and increase work of breathing. Patient unable to complete full sentence due to SOB.  Patient declined ED. She has been without trelegy since August due to high co pay.-- pharmacy team, can you guys run a claim?  RB, please advise. Thanks

## 2020-01-23 ENCOUNTER — Other Ambulatory Visit: Payer: Self-pay | Admitting: Internal Medicine

## 2020-01-23 DIAGNOSIS — F32A Depression, unspecified: Secondary | ICD-10-CM

## 2020-01-30 ENCOUNTER — Other Ambulatory Visit: Payer: Self-pay

## 2020-01-30 MED ORDER — TRELEGY ELLIPTA 100-62.5-25 MCG/INH IN AEPB: 1.0000 | INHALATION_SPRAY | Freq: Every day | RESPIRATORY_TRACT | 11 refills | Status: DC

## 2020-01-30 NOTE — Telephone Encounter (Signed)
Paper work found and placed in Dr. Agustina Caroli box along with script to be signed when Dr. Lamonte Sakai returns to office. Notified pt of plan to have paperwork signed. Nothing further needed at this time.  Forwarding to Dr. Lamonte Sakai as Juluis Rainier.

## 2020-02-06 ENCOUNTER — Other Ambulatory Visit: Payer: Self-pay

## 2020-02-06 DIAGNOSIS — E119 Type 2 diabetes mellitus without complications: Secondary | ICD-10-CM

## 2020-02-06 MED ORDER — METFORMIN HCL 500 MG PO TABS: 1000.0000 mg | ORAL_TABLET | Freq: Two times a day (BID) | ORAL | 3 refills | Status: DC

## 2020-02-11 ENCOUNTER — Other Ambulatory Visit: Payer: Self-pay

## 2020-02-11 MED ORDER — TRELEGY ELLIPTA 100-62.5-25 MCG/INH IN AEPB: 1.0000 | INHALATION_SPRAY | Freq: Every day | RESPIRATORY_TRACT | 3 refills | Status: DC

## 2020-02-11 NOTE — Telephone Encounter (Signed)
Done 02/11/20

## 2020-03-08 ENCOUNTER — Other Ambulatory Visit: Payer: Self-pay | Admitting: Internal Medicine

## 2020-03-08 DIAGNOSIS — I10 Essential (primary) hypertension: Secondary | ICD-10-CM

## 2020-03-08 NOTE — Telephone Encounter (Signed)
Please refill as per office routine med refill policy (all routine meds refilled for 3 mo or monthly per pt preference up to one year from last visit, then month to month grace period for 3 mo, then further med refills will have to be denied)  

## 2020-03-09 ENCOUNTER — Telehealth: Payer: Self-pay | Admitting: Internal Medicine

## 2020-03-09 NOTE — Telephone Encounter (Signed)
Caryl Pina with [facility] called and said she was calling because the patient has diabetes they may not be on a statin per the 2020 ADA and ACC/AHA cholesterol guideline recommendation and are asking the provider to re evaluate and prescribe a statin that is appropriate.   Phone: [phone]

## 2020-03-09 NOTE — Telephone Encounter (Signed)
Sent to Dr. John. 

## 2020-03-10 ENCOUNTER — Emergency Department (HOSPITAL_COMMUNITY): Payer: Medicare Other

## 2020-03-10 ENCOUNTER — Emergency Department (HOSPITAL_COMMUNITY)
Admission: EM | Admit: 2020-03-10 | Discharge: 2020-03-10 | Disposition: A | Discharge status: Home / Self Care | Payer: Medicare Other | Attending: Emergency Medicine | Admitting: Emergency Medicine

## 2020-03-10 ENCOUNTER — Encounter (HOSPITAL_COMMUNITY): Payer: Self-pay | Admitting: Emergency Medicine

## 2020-03-10 ENCOUNTER — Other Ambulatory Visit: Payer: Self-pay

## 2020-03-10 DIAGNOSIS — J449 Chronic obstructive pulmonary disease, unspecified: Secondary | ICD-10-CM | POA: Insufficient documentation

## 2020-03-10 DIAGNOSIS — Z794 Long term (current) use of insulin: Secondary | ICD-10-CM | POA: Insufficient documentation

## 2020-03-10 DIAGNOSIS — E119 Type 2 diabetes mellitus without complications: Secondary | ICD-10-CM | POA: Diagnosis not present

## 2020-03-10 DIAGNOSIS — I700 Atherosclerosis of aorta: Secondary | ICD-10-CM | POA: Diagnosis not present

## 2020-03-10 DIAGNOSIS — T402X1A Poisoning by other opioids, accidental (unintentional), initial encounter: Secondary | ICD-10-CM | POA: Insufficient documentation

## 2020-03-10 DIAGNOSIS — R404 Transient alteration of awareness: Secondary | ICD-10-CM | POA: Diagnosis not present

## 2020-03-10 DIAGNOSIS — R0902 Hypoxemia: Secondary | ICD-10-CM | POA: Diagnosis not present

## 2020-03-10 DIAGNOSIS — Z79899 Other long term (current) drug therapy: Secondary | ICD-10-CM | POA: Diagnosis not present

## 2020-03-10 DIAGNOSIS — I10 Essential (primary) hypertension: Secondary | ICD-10-CM | POA: Diagnosis not present

## 2020-03-10 DIAGNOSIS — F17210 Nicotine dependence, cigarettes, uncomplicated: Secondary | ICD-10-CM | POA: Insufficient documentation

## 2020-03-10 DIAGNOSIS — R4020 Unspecified coma: Secondary | ICD-10-CM | POA: Diagnosis not present

## 2020-03-10 DIAGNOSIS — T50904A Poisoning by unspecified drugs, medicaments and biological substances, undetermined, initial encounter: Secondary | ICD-10-CM | POA: Diagnosis not present

## 2020-03-10 DIAGNOSIS — T50901A Poisoning by unspecified drugs, medicaments and biological substances, accidental (unintentional), initial encounter: Secondary | ICD-10-CM | POA: Diagnosis present

## 2020-03-10 DIAGNOSIS — T40601A Poisoning by unspecified narcotics, accidental (unintentional), initial encounter: Secondary | ICD-10-CM

## 2020-03-10 DIAGNOSIS — R0689 Other abnormalities of breathing: Secondary | ICD-10-CM | POA: Diagnosis not present

## 2020-03-10 DIAGNOSIS — J90 Pleural effusion, not elsewhere classified: Secondary | ICD-10-CM | POA: Diagnosis not present

## 2020-03-10 LAB — CBC WITH DIFFERENTIAL/PLATELET
Abs Immature Granulocytes: 0.14 10*3/uL — ABNORMAL HIGH (ref 0.00–0.07)
Basophils Absolute: 0.1 10*3/uL (ref 0.0–0.1)
Basophils Relative: 1 %
Eosinophils Absolute: 0.3 10*3/uL (ref 0.0–0.5)
Eosinophils Relative: 2 %
HCT: 45.6 % (ref 36.0–46.0)
Hemoglobin: 14.9 g/dL (ref 12.0–15.0)
Immature Granulocytes: 1 %
Lymphocytes Relative: 5 %
Lymphs Abs: 0.7 10*3/uL (ref 0.7–4.0)
MCH: 31.4 pg (ref 26.0–34.0)
MCHC: 32.7 g/dL (ref 30.0–36.0)
MCV: 96.2 fL (ref 80.0–100.0)
Monocytes Absolute: 0.8 10*3/uL (ref 0.1–1.0)
Monocytes Relative: 6 %
Neutro Abs: 10.9 10*3/uL — ABNORMAL HIGH (ref 1.7–7.7)
Neutrophils Relative %: 85 %
Platelets: 218 10*3/uL (ref 150–400)
RBC: 4.74 MIL/uL (ref 3.87–5.11)
RDW: 13.2 % (ref 11.5–15.5)
WBC: 12.8 10*3/uL — ABNORMAL HIGH (ref 4.0–10.5)
nRBC: 0 % (ref 0.0–0.2)

## 2020-03-10 LAB — CBG MONITORING, ED: Glucose-Capillary: 255 mg/dL — ABNORMAL HIGH (ref 70–99)

## 2020-03-10 LAB — URINALYSIS, ROUTINE W REFLEX MICROSCOPIC
Bilirubin Urine: NEGATIVE
Glucose, UA: NEGATIVE mg/dL
Hgb urine dipstick: NEGATIVE
Ketones, ur: 5 mg/dL — AB
Leukocytes,Ua: NEGATIVE
Nitrite: NEGATIVE
Protein, ur: 100 mg/dL — AB
Specific Gravity, Urine: 1.016 (ref 1.005–1.030)
pH: 5 (ref 5.0–8.0)

## 2020-03-10 LAB — RAPID URINE DRUG SCREEN, HOSP PERFORMED
Amphetamines: NOT DETECTED
Barbiturates: NOT DETECTED
Benzodiazepines: NOT DETECTED
Cocaine: NOT DETECTED
Opiates: POSITIVE — AB
Tetrahydrocannabinol: NOT DETECTED

## 2020-03-10 LAB — BLOOD GAS, VENOUS
Acid-base deficit: 0.5 mmol/L (ref 0.0–2.0)
Bicarbonate: 28.8 mmol/L — ABNORMAL HIGH (ref 20.0–28.0)
FIO2: 21
O2 Saturation: 58.7 %
Patient temperature: 98.6
pCO2, Ven: 69.5 mmHg — ABNORMAL HIGH (ref 44.0–60.0)
pH, Ven: 7.241 — ABNORMAL LOW (ref 7.250–7.430)
pO2, Ven: 38.4 mmHg (ref 32.0–45.0)

## 2020-03-10 LAB — COMPREHENSIVE METABOLIC PANEL
ALT: 26 U/L (ref 0–44)
AST: 33 U/L (ref 15–41)
Albumin: 3.9 g/dL (ref 3.5–5.0)
Alkaline Phosphatase: 107 U/L (ref 38–126)
Anion gap: 13 (ref 5–15)
BUN: 26 mg/dL — ABNORMAL HIGH (ref 8–23)
CO2: 22 mmol/L (ref 22–32)
Calcium: 8.9 mg/dL (ref 8.9–10.3)
Chloride: 101 mmol/L (ref 98–111)
Creatinine, Ser: 1.02 mg/dL — ABNORMAL HIGH (ref 0.44–1.00)
GFR, Estimated: 58 mL/min — ABNORMAL LOW (ref >60)
Glucose, Bld: 270 mg/dL — ABNORMAL HIGH (ref 70–99)
Potassium: 4.6 mmol/L (ref 3.5–5.1)
Sodium: 136 mmol/L (ref 135–145)
Total Bilirubin: 0.6 mg/dL (ref 0.3–1.2)
Total Protein: 7 g/dL (ref 6.5–8.1)

## 2020-03-10 NOTE — ED Provider Notes (Signed)
[facility] DEPT Provider Note   CSN: [id number] Arrival date & time: 03/10/20  1049     History Chief Complaint  Patient presents with  . Drug Overdose    Crystal Thornton is a 74 y.o. female presenting for evaluation of accidental drug overdose.  Patient states around 930 this morning she injected something.  She does not know what it was.  Per EMS, they were called out due to unresponsive person.  Granddaughter was doing CPR, however when EMS arrived patient had a pulse.  Respirations were assisted with BVM, mental status improved.  Throughout EMS transport, patient remained alert and oriented, requiring 2 L via nasal cannula for oxygen supplementation.  No Narcan was given.  Patient states she feels very tired.  She has no pain.  She states this was not an attempt to kill herself or overdose.  Additional history obtained from chart review.  Patient with a history of depression, hyperlipidemia, diabetes, hypertension, substance abuse.  HPI     Past Medical History:  Diagnosis Date  . ANGIOEDEMA 01/24/2010  . Anxiety   . ANXIETY 11/07/2006  . BACK PAIN 11/01/2007  . Cough 05/29/2008  . CYST, OVARIAN NEC/NOS 11/05/2006  . Depression   . DEPRESSION 11/07/2006  . Diabetes mellitus   . DIABETES MELLITUS, TYPE II 11/07/2006  . DIVERTICULOSIS, COLON 11/07/2006  . FIBROCYSTIC BREAST DISEASE 11/05/2006  . HEAD TRAUMA, CLOSED 01/24/2010  . HEPATITIS B, HX OF 11/05/2006  . Hepatitis C    Treated and no longer has  . HEPATITIS C 11/05/2006  . Hyperlipidemia   . HYPERLIPIDEMIA 11/07/2006  . Hypertension   . HYPERTENSION 05/29/2007  . INSOMNIA, HX OF 11/05/2006  . LOW BACK PAIN 05/29/2007  . Osteopenia   . OSTEOPENIA 11/07/2006  . Other specified sites of sprains and strains 08/31/2008  . Pain    R. shoulder and lower back and legs  . PULMONARY NODULE, LEFT UPPER LOBE 05/31/2009  . Seizures ([place])   . SHOULDER PAIN, RIGHT, CHRONIC 06/02/2010  . SINUSITIS- ACUTE-NOS  12/10/2007  . SKIN LESION 06/02/2010  . TOTAL ABDOMINAL HYSTERECTOMY, HX OF 03/27/1990  . Wears dentures    full top  . Wears glasses     Patient Active Problem List   Diagnosis Date Noted  . Hearing loss 07/16/2019  . Community acquired pneumonia 06/26/2019  . Acute respiratory failure with hypoxia ([place]) 06/26/2019  . Syncope 04/24/2017  . [facility] spotted fever) 12/25/2016  . COPD (chronic obstructive pulmonary disease) ([place]) 11/16/2016  . Rash 11/16/2016  . Choledocholithiasis 06/30/2015  . Urinary urgency 06/24/2015  . Right flank pain 06/24/2015  . RUQ pain 06/24/2015  . Delusions ([place]) 11/03/2014  . Hallucinations 11/03/2014  . Chronic pain syndrome   . Seizure ([place]) 11/01/2014  . Smoker 08/12/2014  . Encounter for well adult exam with abnormal findings 11/02/2010  . Disorder of skin or subcutaneous tissue 06/02/2010  . SHOULDER PAIN, RIGHT, CHRONIC 06/02/2010  . HEAD TRAUMA, CLOSED 01/24/2010  . ANGIOEDEMA 01/24/2010  . Pulmonary nodule, left 05/31/2009  . Other specified sites of sprains and strains 08/31/2008  . Cough 05/29/2008  . BACK PAIN 11/01/2007  . Essential hypertension 05/29/2007  . LOW BACK PAIN 05/29/2007  . Diabetes mellitus type 2, uncontrolled ([place]) 11/07/2006  . Hyperlipidemia 11/07/2006  . Anxiety state 11/07/2006  . Depression 11/07/2006  . DIVERTICULOSIS, COLON 11/07/2006  . OSTEOPENIA 11/07/2006  . HEPATITIS C 11/05/2006  . FIBROCYSTIC BREAST DISEASE 11/05/2006  . CYST,  OVARIAN NEC/NOS 11/05/2006  . HEPATITIS B, HX OF 11/05/2006  . Insomnia 11/05/2006  . TOTAL ABDOMINAL HYSTERECTOMY, HX OF 03/27/1990    Past Surgical History:  Procedure Laterality Date  . APPENDECTOMY    . CERVICAL FUSION  2013  . CESAREAN SECTION    . COLONOSCOPY    . EUS N/A 07/29/2015   Procedure: UPPER ENDOSCOPIC ULTRASOUND (EUS) LINEAR;  Surgeon: Milus Banister, MD;  Location: [facility];  Service: Endoscopy;  Laterality: N/A;  . EXTERNAL FIXATION LEG  Left 12/12/2012   Procedure: LEFT ADJUSTMENT OF EXTERNAL FIXATION UNDER ANESTHESIA;  Surgeon: Wylene Simmer, MD;  Location: [facility];  Service: Orthopedics;  Laterality: Left;  . LIVER BIOPSY    . ORIF ANKLE FRACTURE Left 12/03/2012  . ORIF ANKLE FRACTURE Left 01/02/2013   Procedure: LEFT ANKLE REMOVAL OF EXTERNAL FIXATOR OPEN REDUCTION INTERNAL FIXATION (ORIF) TRIMALLEOLAR FRACTURE;  Surgeon: Wylene Simmer, MD;  Location: [facility];  Service: Orthopedics;  Laterality: Left;  . OVARIAN CYST SURGERY     ruptured  . SHOULDER ARTHROSCOPY  2008   left  . TUBAL LIGATION       OB History   No obstetric history on file.     Family History  Problem Relation Age of Onset  . Liver cancer Mother   . Breast cancer Neg Hx     Social History   Tobacco Use  . Smoking status: Current Every Day Smoker    Packs/day: 0.25    Years: 50.00    Pack years: 12.50    Types: Cigarettes  . Smokeless tobacco: Never Used  Vaping Use  . Vaping Use: Never used  Substance Use Topics  . Alcohol use: No  . Drug use: No    Home Medications Prior to Admission medications   Medication Sig Start Date End Date Taking? Authorizing Provider  albuterol (PROVENTIL HFA;VENTOLIN HFA) 108 (90 Base) MCG/ACT inhaler Inhale 2 puffs into the lungs every 6 (six) hours as needed for wheezing or shortness of breath. 05/13/18   Collene Gobble, MD  albuterol (PROVENTIL) (2.5 MG/3ML) 0.083% nebulizer solution Take 3 mLs (2.5 mg total) by nebulization every 6 (six) hours as needed for wheezing or shortness of breath. DX: J44.9 02/25/19   Collene Gobble, MD  atenolol (TENORMIN) 50 MG tablet TAKE 1 TABLET BY MOUTH DAILY 03/09/20   Biagio Borg, MD  Blood Glucose Monitoring Suppl [facility] VERIO FLEX SYSTEM) w/Device KIT Use as directed daily E11.9 10/31/18   Biagio Borg, MD  buPROPion (WELLBUTRIN XL) 150 MG 24 hr tablet Take 1 tablet (150 mg total) by mouth daily. 10/23/19   Biagio Borg, MD   cloNIDine (CATAPRES - DOSED IN MG/24 HR) 0.1 mg/24hr patch 0.1 mg once a week. 07/09/19   [provider]  doxycycline (VIBRA-TABS) 100 MG tablet Take 1 tablet (100 mg total) by mouth 2 (two) times daily. 01/02/20   Collene Gobble, MD  doxycycline (VIBRAMYCIN) 100 MG capsule Take 1 capsule (100 mg total) by mouth 2 (two) times daily. 01/01/20   Horton, Barbette Hair, MD  Fluticasone-Umeclidin-Vilant (TRELEGY ELLIPTA) 100-62.5-25 MCG/INH AEPB Inhale 1 puff into the lungs daily. 02/11/20   Collene Gobble, MD  Ginseng 100 MG CAPS Take 100 mg by mouth daily.    [provider]  glucose blood (ONETOUCH VERIO) test strip Use as instructed daily E11.9 10/31/18   Biagio Borg, MD  metFORMIN (GLUCOPHAGE) 500 MG tablet Take 2 tablets (1,000  mg total) by mouth 2 (two) times daily. 02/06/20   Biagio Borg, MD  methocarbamol (ROBAXIN) 750 MG tablet Take 750 mg by mouth 3 (three) times daily. 02/26/19   [provider]  Misc Natural Products (GINKOGIN PO) Take by mouth.    [provider]  Multiple Vitamins-Minerals (CENTRUM SILVER PO) Take 1 tablet by mouth daily.      [provider]  oxycodone (ROXICODONE) 30 MG immediate release tablet Take 30 mg by mouth 4 (four) times daily as needed for pain. 06/11/19   [provider]  predniSONE (DELTASONE) 10 MG tablet Take 4 tabs by mouth for 3 days, then 3 for 3 days, 2 for 3 days, 1 for 3 days and stop 01/02/20   Collene Gobble, MD  rosuvastatin (CRESTOR) 40 MG tablet Take 1 tablet (40 mg total) by mouth daily. 10/01/17   Biagio Borg, MD  sertraline (ZOLOFT) 100 MG tablet TAKE 2 TABLETS(200 MG) BY MOUTH DAILY 01/23/20   Biagio Borg, MD    Allergies    Naproxen sodium, Sulfonamide derivatives, Belsomra [suvorexant], Chantix [varenicline tartrate], Clonazepam, and Ace inhibitors  Review of Systems   Review of Systems  Neurological:       Episode of unresponsiveness   Psychiatric/Behavioral: Positive for  confusion.  All other systems reviewed and are negative.   Physical Exam Updated Vital Signs BP 110/65   Pulse (!) 58   Resp 16   SpO2 90%   Physical Exam Vitals and nursing note reviewed.  Constitutional:      General: She is not in acute distress.    Appearance: She is well-developed and well-nourished.     Comments: Appears tired, but nontoxic.   HENT:     Head: Normocephalic and atraumatic.  Eyes:     Extraocular Movements: Extraocular movements intact and EOM normal.     Conjunctiva/sclera: Conjunctivae normal.     Pupils: Pupils are equal, round, and reactive to light.  Cardiovascular:     Rate and Rhythm: Normal rate and regular rhythm.     Pulses: Normal pulses and intact distal pulses.  Pulmonary:     Effort: Pulmonary effort is normal. No respiratory distress.     Breath sounds: Normal breath sounds. No wheezing.     Comments: Speaking in full sentences. Clear lung sounds in all fields. SpO2 89-90% on RA.  Abdominal:     General: There is no distension.     Palpations: Abdomen is soft. There is no mass.     Tenderness: There is no abdominal tenderness. There is no guarding or rebound.  Musculoskeletal:        General: Normal range of motion.     Cervical back: Normal range of motion and neck supple.     Right lower leg: No edema.     Left lower leg: No edema.  Skin:    General: Skin is warm and dry.     Capillary Refill: Capillary refill takes less than 2 seconds.  Neurological:     Mental Status: She is alert and oriented to person, place, and time.     Comments: Alert and oriented x4. strength and sensation intact x4.   Psychiatric:        Mood and Affect: Mood and affect normal.        Thought Content: Thought content does not include suicidal ideation. Thought content does not include suicidal plan.     ED Results / Procedures / Treatments  Labs (all labs ordered are listed, but only abnormal results are displayed) Labs Reviewed  CBC WITH  DIFFERENTIAL/PLATELET - Abnormal; Notable for the following components:      Result Value   WBC 12.8 (*)    Neutro Abs 10.9 (*)    Abs Immature Granulocytes 0.14 (*)    All other components within normal limits  COMPREHENSIVE METABOLIC PANEL - Abnormal; Notable for the following components:   Glucose, Bld 270 (*)    BUN 26 (*)    Creatinine, Ser 1.02 (*)    GFR, Estimated 58 (*)    All other components within normal limits  RAPID URINE DRUG SCREEN, HOSP PERFORMED - Abnormal; Notable for the following components:   Opiates POSITIVE (*)    All other components within normal limits  URINALYSIS, ROUTINE W REFLEX MICROSCOPIC - Abnormal; Notable for the following components:   Color, Urine AMBER (*)    APPearance CLOUDY (*)    Ketones, ur 5 (*)    Protein, ur 100 (*)    Bacteria, UA RARE (*)    All other components within normal limits  BLOOD GAS, VENOUS - Abnormal; Notable for the following components:   pH, Ven 7.241 (*)    pCO2, Ven 69.5 (*)    Bicarbonate 28.8 (*)    All other components within normal limits  CBG MONITORING, ED - Abnormal; Notable for the following components:   Glucose-Capillary 255 (*)    All other components within normal limits    EKG None  Radiology DG Chest 2 View  Result Date: 03/10/2020 CLINICAL DATA:  Altered mental status. EXAM: CHEST - 2 VIEW COMPARISON:  Chest x-ray dated January 01, 2020. FINDINGS: The heart size and mediastinal contours are within normal limits. Atherosclerotic calcification of the aortic arch. Chronically coarsened interstitial markings are similar to prior studies. No focal consolidation, pleural effusion, or pneumothorax. No acute osseous abnormality. IMPRESSION: 1. No acute cardiopulmonary disease. Electronically Signed   By: Titus Dubin M.D.   On: 03/10/2020 11:46    Procedures Procedures (including critical care time)  Medications Ordered in ED Medications - No data to display  ED Course  I have reviewed the  triage vital signs and the nursing notes.  Pertinent labs & imaging results that were available during my care of the patient were reviewed by me and considered in my medical decision making (see chart for details).    MDM Rules/Calculators/A&P                          Patient presenting for evaluation of unresponsiveness respiratory depression.  On exam, patient appears nontoxic.  Sats 89 to 90% on room air.  She does have a history of COPD, this may be her baseline.  However as patient did need BVM assistance with EMS, will obtain labs, venous blood gas, and cxr. Will continue to monitor.   Labs interpreted by me, overall reassuring.  ABG shows mild hypercarbia, likely chronic.  Electrolytes are stable.  Glucose elevated, but no DKA.  Chest x-ray viewed interpreted by me, no pneumonia pneumothorax, effusion, cardiomegaly.  UDS positive for opioids.  On reassessment, patient is more alert and oriented.  Oxygen discontinued.  Patient mains without hypoxia on room air.  Ambulated without difficulty, tolerating p.o.  She is more alert and oriented at this time, holding a full conversation.  Granddaughter at bedside, feels comfortable taking patient home.  I discussed she may need to continue to metabolize,  and she should return with any worsening mental status.  Encouraged patient to follow-up with substance abuse program.  At this time, patient appears safe for discharge.  Return precautions given.  Patient states she understands and agrees to plan.  Final Clinical Impression(s) / ED Diagnoses Final diagnoses:  Opiate overdose, accidental or unintentional, initial encounter [facility])    Rx / DC Orders ED Discharge Orders    None       Franchot Heidelberg, PA-C 03/10/20 1353    Blanchie Dessert, MD 03/11/20 [phone]

## 2020-03-10 NOTE — Discharge Instructions (Addendum)
Continue taking her medications as prescribed. There is information in the paperwork about rehab facilities, I recommend you follow-up. Follow-up with your primary care doctor as needed for further evaluation. Return to the emergency room with any new, worsening, or concerning symptoms

## 2020-03-10 NOTE — ED Notes (Signed)
Pt walked to [place] [facility] with stand by assist. Pt given juice and resting at this time.

## 2020-03-10 NOTE — ED Notes (Signed)
Updated son regarding patient's plan of care

## 2020-03-10 NOTE — ED Triage Notes (Addendum)
Pt coming from home with complaints of drug overdose ;Lives with son and granddaughter  Admits to using IV drug in the L arm ; pt states she doesn't know what it was Hx opiate abuse  4-6 RR on arrival  BVM vent  153/96BP  80HR  332 CBG 85%RA  95% 2 L [place] 20G R AC

## 2020-03-11 ENCOUNTER — Encounter: Payer: Self-pay | Admitting: Internal Medicine

## 2020-03-15 ENCOUNTER — Telehealth (INDEPENDENT_AMBULATORY_CARE_PROVIDER_SITE_OTHER): Payer: Medicare Other | Admitting: Internal Medicine

## 2020-03-15 DIAGNOSIS — J439 Emphysema, unspecified: Secondary | ICD-10-CM

## 2020-03-15 DIAGNOSIS — F329 Major depressive disorder, single episode, unspecified: Secondary | ICD-10-CM | POA: Insufficient documentation

## 2020-03-15 DIAGNOSIS — F322 Major depressive disorder, single episode, severe without psychotic features: Secondary | ICD-10-CM | POA: Diagnosis not present

## 2020-03-15 DIAGNOSIS — I10 Essential (primary) hypertension: Secondary | ICD-10-CM

## 2020-03-15 DIAGNOSIS — G894 Chronic pain syndrome: Secondary | ICD-10-CM | POA: Diagnosis not present

## 2020-03-15 DIAGNOSIS — E1165 Type 2 diabetes mellitus with hyperglycemia: Secondary | ICD-10-CM

## 2020-03-15 DIAGNOSIS — E785 Hyperlipidemia, unspecified: Secondary | ICD-10-CM

## 2020-03-15 MED ORDER — BUPROPION HCL ER (XL) 300 MG PO TB24: 300.0000 mg | ORAL_TABLET | Freq: Every day | ORAL | 3 refills | Status: AC

## 2020-03-15 MED ORDER — ARIPIPRAZOLE 2 MG PO TABS: 2.0000 mg | ORAL_TABLET | Freq: Every day | ORAL | 3 refills | Status: AC

## 2020-03-15 NOTE — Assessment & Plan Note (Signed)
stable overall by history and exam, recent data reviewed with pt, and pt to continue medical treatment as before,  to f/u any worsening symptoms or concerns  

## 2020-03-15 NOTE — Assessment & Plan Note (Addendum)
Worsening the past 2 wks despite good med compliance, for cont zoloft 200 qd, increased wellbutrin xl 300 qd, add abilify 5 mg qd, and refer psychiatry  I spent 41 minutes in preparing to see the patient by review of recent labs, imaging and procedures, obtaining and reviewing separately obtained history, communicating with the patient and family or caregiver, ordering medications, tests or procedures, and documenting clinical information in the EHR including the differential Dx, treatment, and any further evaluation and other management of major depression, chronic pain, copd, dm, htn, hld,

## 2020-03-15 NOTE — Assessment & Plan Note (Signed)
Uncontrolled at recent ED visit, for f/u a1c with labs next wk at our [facility] lab site per pt preference

## 2020-03-15 NOTE — Assessment & Plan Note (Signed)
enouraged pt to f/u BP at home with goal at lesat less than 140/90

## 2020-03-15 NOTE — Progress Notes (Signed)
Patient ID: Crystal Thornton, female   DOB: 03/19/1946, 74 y.o.   MRN: [id number]  Virtual Visit via Video Note  I connected with Crystal Thornton on 03/15/20 at  8:20 AM EST by a video enabled telemedicine application and verified that I am speaking with the correct person using two identifiers.  Location of all participants today Patient: at home Provider: at office   I discussed the limitations of evaluation and management by telemedicine and the availability of in person appointments. The patient expressed understanding and agreed to proceed.  History of Present Illness: Here to f/u after seen at ED dec 20 with what pt today states was intentional OD, it seemed to be brought on by the whole covid situation and loneliness, the holidays and her pain management provider has been trying to reduce her pain meds.  Has no plan for SI or HI today, but is asking for psychiatric referral and med adjustment today.  Pt denies chest pain, increased sob or doe, wheezing, orthopnea, PND, increased LE swelling, palpitations, dizziness or syncope.  Pt denies new neurological symptoms such as new headache, or facial or extremity weakness or numbness   Pt denies polydipsia, polyuria, or low sugar symptoms such as weakness or confusion improved with po intake.  Pt states overall good compliance with meds, trying to follow lower cholesterol, diabetic diet, wt overall stable but little exercise however.   Plans to see optho next wk.  Unable for foot exam today due to video visit  Just had flu shot and covid booster yesterday Past Medical History:  Diagnosis Date  . ANGIOEDEMA 01/24/2010  . Anxiety   . ANXIETY 11/07/2006  . BACK PAIN 11/01/2007  . Cough 05/29/2008  . CYST, OVARIAN NEC/NOS 11/05/2006  . Depression   . DEPRESSION 11/07/2006  . Diabetes mellitus   . DIABETES MELLITUS, TYPE II 11/07/2006  . DIVERTICULOSIS, COLON 11/07/2006  . FIBROCYSTIC BREAST DISEASE 11/05/2006  . HEAD TRAUMA, CLOSED 01/24/2010  .  HEPATITIS B, HX OF 11/05/2006  . Hepatitis C    Treated and no longer has  . HEPATITIS C 11/05/2006  . Hyperlipidemia   . HYPERLIPIDEMIA 11/07/2006  . Hypertension   . HYPERTENSION 05/29/2007  . INSOMNIA, HX OF 11/05/2006  . LOW BACK PAIN 05/29/2007  . Osteopenia   . OSTEOPENIA 11/07/2006  . Other specified sites of sprains and strains 08/31/2008  . Pain    R. shoulder and lower back and legs  . PULMONARY NODULE, LEFT UPPER LOBE 05/31/2009  . Seizures ([place])   . SHOULDER PAIN, RIGHT, CHRONIC 06/02/2010  . SINUSITIS- ACUTE-NOS 12/10/2007  . SKIN LESION 06/02/2010  . TOTAL ABDOMINAL HYSTERECTOMY, HX OF 03/27/1990  . Wears dentures    full top  . Wears glasses    Past Surgical History:  Procedure Laterality Date  . APPENDECTOMY    . CERVICAL FUSION  2013  . CESAREAN SECTION    . COLONOSCOPY    . EUS N/A 07/29/2015   Procedure: UPPER ENDOSCOPIC ULTRASOUND (EUS) LINEAR;  Surgeon: Milus Banister, MD;  [facility];  Service: Endoscopy;  Laterality: N/A;  . EXTERNAL FIXATION LEG Left 12/12/2012   Procedure: LEFT ADJUSTMENT OF EXTERNAL FIXATION UNDER ANESTHESIA;  Surgeon: Wylene Simmer, MD;  Location: [place];  Service: Orthopedics;  Laterality: Left;  . LIVER BIOPSY    . ORIF ANKLE FRACTURE Left 12/03/2012  . ORIF ANKLE FRACTURE Left 01/02/2013   Procedure: LEFT ANKLE REMOVAL OF EXTERNAL FIXATOR OPEN REDUCTION INTERNAL FIXATION (  ORIF) TRIMALLEOLAR FRACTURE;  Surgeon: Wylene Simmer, MD;  Location: [place];  Service: Orthopedics;  Laterality: Left;  . OVARIAN CYST SURGERY     ruptured  . SHOULDER ARTHROSCOPY  2008   left  . TUBAL LIGATION      reports that she has been smoking cigarettes. She has a 12.50 pack-year smoking history. She has never used smokeless tobacco. She reports that she does not drink alcohol and does not use drugs. family history includes Liver cancer in her mother. Allergies  Allergen Reactions  . Naproxen Sodium Anaphylaxis  . Sulfonamide  Derivatives Other (See Comments)    Causes nephritis  . Belsomra [Suvorexant]     To not take per pain management  . Chantix [Varenicline Tartrate] Other (See Comments)  . Clonazepam     To not take per pain management  . Ace Inhibitors Swelling    Lip swelling   Current Outpatient Medications on File Prior to Visit  Medication Sig Dispense Refill  . albuterol (PROVENTIL HFA;VENTOLIN HFA) 108 (90 Base) MCG/ACT inhaler Inhale 2 puffs into the lungs every 6 (six) hours as needed for wheezing or shortness of breath. 3 Inhaler 0  . albuterol (PROVENTIL) (2.5 MG/3ML) 0.083% nebulizer solution Take 3 mLs (2.5 mg total) by nebulization every 6 (six) hours as needed for wheezing or shortness of breath. DX: J44.9 360 mL 2  . atenolol (TENORMIN) 50 MG tablet TAKE 1 TABLET BY MOUTH DAILY 90 tablet 2  . Blood Glucose Monitoring Suppl (ONETOUCH VERIO FLEX SYSTEM) w/Device KIT Use as directed daily E11.9 1 kit 0  . buPROPion (WELLBUTRIN XL) 150 MG 24 hr tablet Take 1 tablet (150 mg total) by mouth daily. 90 tablet 3  . cloNIDine (CATAPRES - DOSED IN MG/24 HR) 0.1 mg/24hr patch 0.1 mg once a week.    . doxycycline (VIBRA-TABS) 100 MG tablet Take 1 tablet (100 mg total) by mouth 2 (two) times daily. 14 tablet 0  . doxycycline (VIBRAMYCIN) 100 MG capsule Take 1 capsule (100 mg total) by mouth 2 (two) times daily. 20 capsule 0  . Fluticasone-Umeclidin-Vilant (TRELEGY ELLIPTA) 100-62.5-25 MCG/INH AEPB Inhale 1 puff into the lungs daily. 60 each 3  . Ginseng 100 MG CAPS Take 100 mg by mouth daily.    Marland Kitchen glucose blood (ONETOUCH VERIO) test strip Use as instructed daily E11.9 100 each 12  . metFORMIN (GLUCOPHAGE) 500 MG tablet Take 2 tablets (1,000 mg total) by mouth 2 (two) times daily. 360 tablet 3  . methocarbamol (ROBAXIN) 750 MG tablet Take 750 mg by mouth 3 (three) times daily.    . Misc Natural Products (GINKOGIN PO) Take by mouth.    . Multiple Vitamins-Minerals (CENTRUM SILVER PO) Take 1 tablet by mouth  daily.      Marland Kitchen oxycodone (ROXICODONE) 30 MG immediate release tablet Take 30 mg by mouth 4 (four) times daily as needed for pain.    . predniSONE (DELTASONE) 10 MG tablet Take 4 tabs by mouth for 3 days, then 3 for 3 days, 2 for 3 days, 1 for 3 days and stop 30 tablet 0  . rosuvastatin (CRESTOR) 40 MG tablet Take 1 tablet (40 mg total) by mouth daily. 90 tablet 3  . sertraline (ZOLOFT) 100 MG tablet TAKE 2 TABLETS(200 MG) BY MOUTH DAILY 180 tablet 1   No current facility-administered medications on file prior to visit.    Observations/Objective: Alert, NAD, appropriate mood and affect, resps normal, cn 2-12 intact, moves all 4s, no visible  rash or swelling Lab Results  Component Value Date   WBC 12.8 (H) 03/10/2020   HGB 14.9 03/10/2020   HCT 45.6 03/10/2020   PLT 218 03/10/2020   GLUCOSE 270 (H) 03/10/2020   CHOL 251 (H) 08/11/2019   TRIG 316.0 (H) 08/11/2019   HDL 57.90 08/11/2019   LDLDIRECT 150.0 08/11/2019   LDLCALC 118 (H) 08/02/2018   ALT 26 03/10/2020   AST 33 03/10/2020   NA 136 03/10/2020   K 4.6 03/10/2020   CL 101 03/10/2020   CREATININE 1.02 (H) 03/10/2020   BUN 26 (H) 03/10/2020   CO2 22 03/10/2020   TSH 0.44 08/02/2018   HGBA1C 6.7 (H) 08/11/2019   MICROALBUR 13.4 (H) 06/29/2017   Assessment and Plan: See notes  Follow Up Instructions: See notes   I discussed the assessment and treatment plan with the patient. The patient was provided an opportunity to ask questions and all were answered. The patient agreed with the plan and demonstrated an understanding of the instructions.   The patient was advised to call back or seek an in-person evaluation if the symptoms worsen or if the condition fails to improve as anticipated.   Cathlean Cower, MD

## 2020-03-15 NOTE — Patient Instructions (Addendum)
Ok to increase the wellbutrin xl to 300 mg per day  Please take all new medication as prescribed - the low dose abilify at 2 mg per day  Please continue all other medications as before, including the zoloft  Please have the pharmacy call with any other refills you may need.  Please remember to call for your eye doctor appt   Please continue your efforts at being more active, low cholesterol diet, and weight control.  Please keep your appointments with your specialists as you may have planned  Please go to the LAB at the blood drawing area for the tests to be done - next wk at the [facility] will be contacted by phone if any changes need to be made immediately.  Otherwise, you will receive a letter about your results with an explanation, but please check with MyChart first.  Please remember to sign up for MyChart if you have not done so, as this will be important to you in the future with finding out test results, communicating by private email, and scheduling acute appointments online when needed.  Please make an Appointment to return in 3 months - I will ask the office to call you

## 2020-03-15 NOTE — Assessment & Plan Note (Signed)
stable overall by history and exam, recent data reviewed with pt, and pt to continue medical treatment as before,  to f/u any worsening symptoms or concerns, for f/u lab next wk

## 2020-03-15 NOTE — Assessment & Plan Note (Signed)
Plans to f/u with her pain management provider

## 2020-04-05 ENCOUNTER — Encounter: Payer: Self-pay | Admitting: Internal Medicine

## 2020-06-20 IMAGING — DX DG CHEST 1V PORT
1 series · 1 of 1 positions shown · non-contrast
Comparison: Chest x-ray dated August 10, 2017.

CLINICAL DATA: Shortness of breath.

EXAM:
PORTABLE CHEST 1 VIEW

[chest ap]
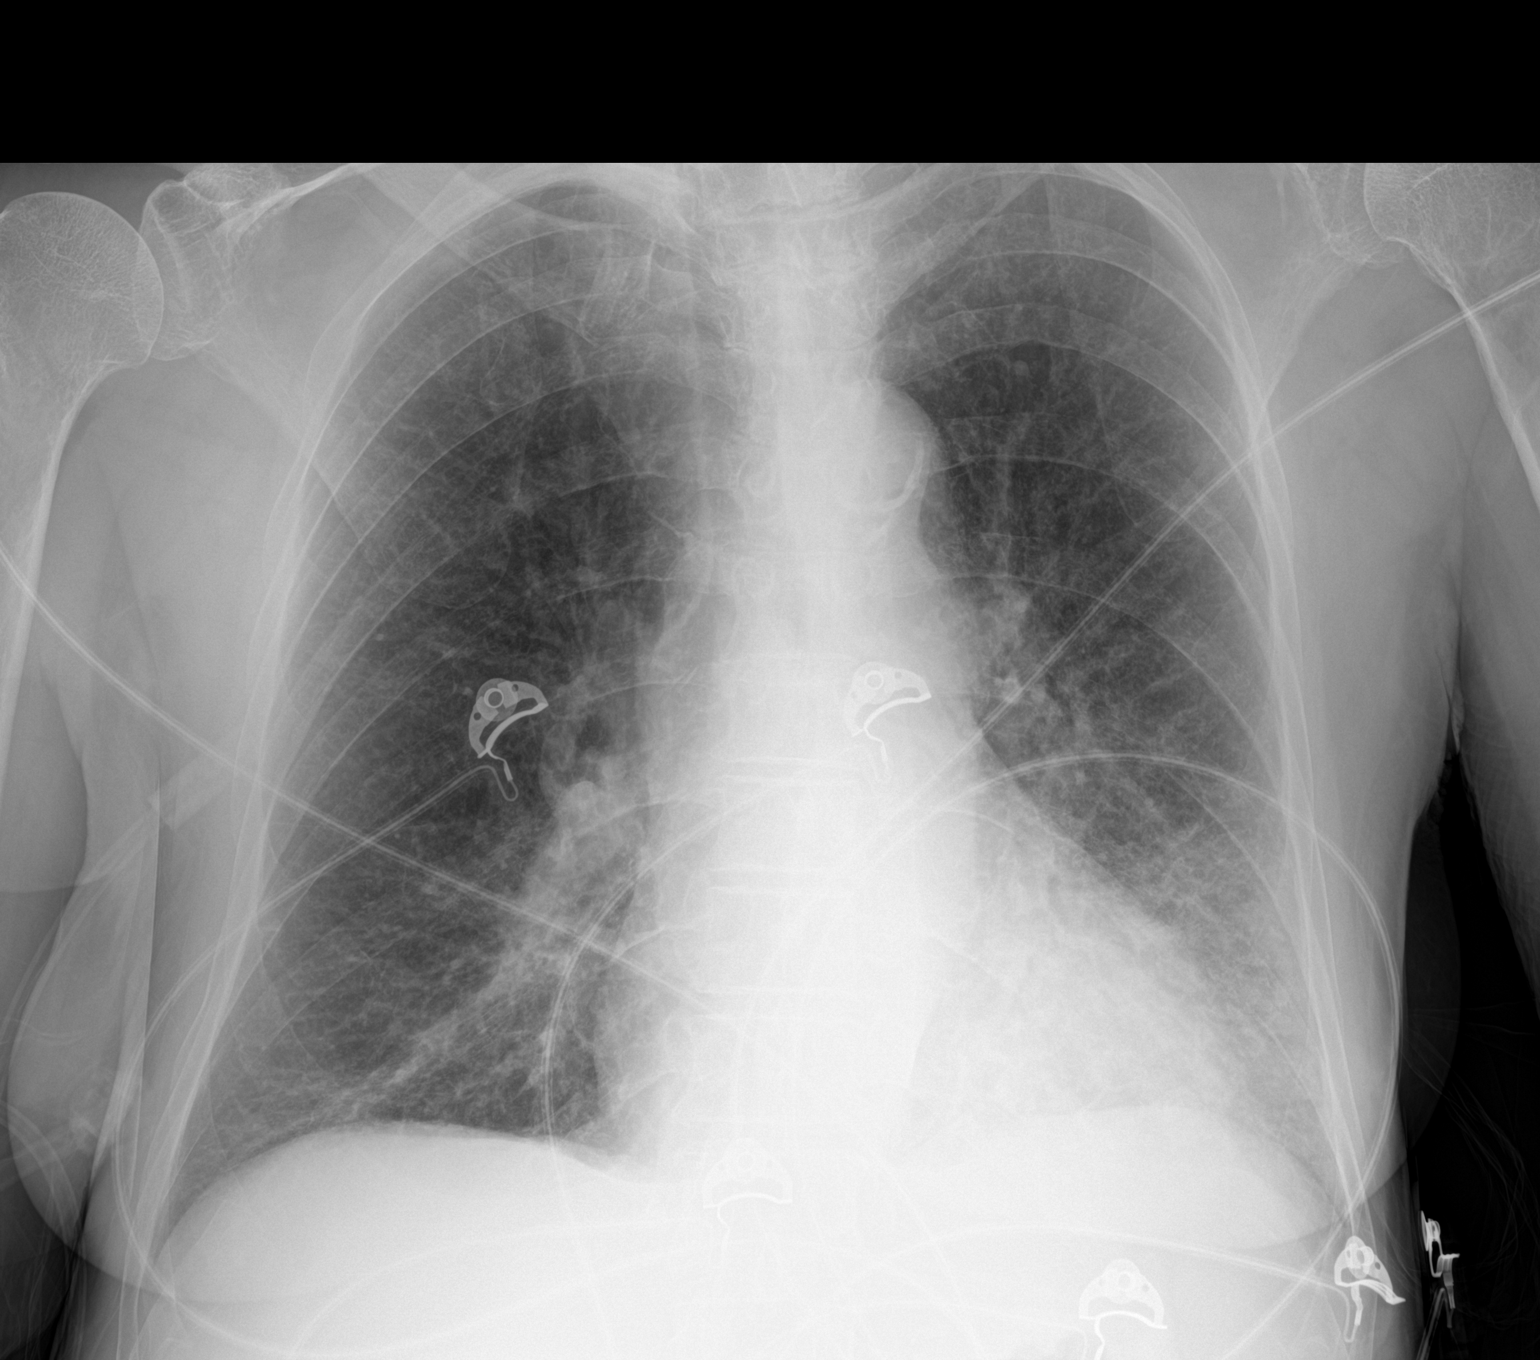

[1 of 1 positions shown; findings below may reference images not displayed]

FINDINGS: The heart size and mediastinal contours are within normal limits.
Atherosclerotic calcification of the aortic arch. The lungs remain
hyperinflated with emphysematous changes and chronically coarsened
interstitial markings. Superimposed asymmetric interstitial
thickening and hazy opacities at the left lung base. No pleural
effusion or pneumothorax. No acute osseous abnormality.
IMPRESSION: 1. Left lower lobe pneumonia.
2. COPD.

## 2020-06-29 ENCOUNTER — Other Ambulatory Visit: Payer: Self-pay | Admitting: Emergency Medicine

## 2020-06-30 ENCOUNTER — Other Ambulatory Visit: Payer: Self-pay | Admitting: Emergency Medicine

## 2020-07-07 ENCOUNTER — Other Ambulatory Visit (INDEPENDENT_AMBULATORY_CARE_PROVIDER_SITE_OTHER): Payer: Medicare Other

## 2020-07-07 ENCOUNTER — Encounter: Payer: Self-pay | Admitting: Internal Medicine

## 2020-07-07 DIAGNOSIS — E1165 Type 2 diabetes mellitus with hyperglycemia: Secondary | ICD-10-CM

## 2020-07-07 LAB — BASIC METABOLIC PANEL
BUN: 20 mg/dL (ref 6–23)
CO2: 29 mEq/L (ref 19–32)
Calcium: 9.8 mg/dL (ref 8.4–10.5)
Chloride: 103 mEq/L (ref 96–112)
Creatinine, Ser: 0.9 mg/dL (ref 0.40–1.20)
GFR: 63.16 mL/min (ref >60.00)
Glucose, Bld: 111 mg/dL — ABNORMAL HIGH (ref 70–99)
Potassium: 5.1 mEq/L (ref 3.5–5.1)
Sodium: 141 mEq/L (ref 135–145)

## 2020-07-07 LAB — HEPATIC FUNCTION PANEL
ALT: 17 U/L (ref 0–35)
AST: 16 U/L (ref 0–37)
Albumin: 4 g/dL (ref 3.5–5.2)
Alkaline Phosphatase: 126 U/L — ABNORMAL HIGH (ref 39–117)
Bilirubin, Direct: 0.1 mg/dL (ref 0.0–0.3)
Total Bilirubin: 0.5 mg/dL (ref 0.2–1.2)
Total Protein: 7.5 g/dL (ref 6.0–8.3)

## 2020-07-07 LAB — HEMOGLOBIN A1C: Hgb A1c MFr Bld: 6.5 % (ref 4.6–6.5)

## 2020-07-07 LAB — LIPID PANEL
Cholesterol: 184 mg/dL (ref 0–200)
HDL: 53 mg/dL (ref >39.00)
LDL Cholesterol: 102 mg/dL — ABNORMAL HIGH (ref 0–99)
NonHDL: 131.3
Total CHOL/HDL Ratio: 3
Triglycerides: 145 mg/dL (ref 0.0–149.0)
VLDL: 29 mg/dL (ref 0.0–40.0)

## 2020-07-13 ENCOUNTER — Telehealth: Payer: Self-pay

## 2020-07-13 NOTE — Telephone Encounter (Signed)
Letter mailed out.

## 2020-07-15 ENCOUNTER — Other Ambulatory Visit: Payer: Self-pay

## 2020-07-16 ENCOUNTER — Ambulatory Visit (INDEPENDENT_AMBULATORY_CARE_PROVIDER_SITE_OTHER): Payer: Medicare Other | Admitting: Internal Medicine

## 2020-07-16 ENCOUNTER — Encounter: Payer: Self-pay | Admitting: Internal Medicine

## 2020-07-16 VITALS — BP 162/108 | HR 74 | Temp 98.6°F | Ht 67.0 in | Wt 131.4 lb

## 2020-07-16 DIAGNOSIS — E559 Vitamin D deficiency, unspecified: Secondary | ICD-10-CM | POA: Diagnosis not present

## 2020-07-16 DIAGNOSIS — E1165 Type 2 diabetes mellitus with hyperglycemia: Secondary | ICD-10-CM | POA: Diagnosis not present

## 2020-07-16 DIAGNOSIS — I10 Essential (primary) hypertension: Secondary | ICD-10-CM | POA: Diagnosis not present

## 2020-07-16 DIAGNOSIS — E538 Deficiency of other specified B group vitamins: Secondary | ICD-10-CM

## 2020-07-16 DIAGNOSIS — Z0001 Encounter for general adult medical examination with abnormal findings: Secondary | ICD-10-CM | POA: Diagnosis not present

## 2020-07-16 DIAGNOSIS — J441 Chronic obstructive pulmonary disease with (acute) exacerbation: Secondary | ICD-10-CM | POA: Diagnosis not present

## 2020-07-16 DIAGNOSIS — E7800 Pure hypercholesterolemia, unspecified: Secondary | ICD-10-CM

## 2020-07-16 MED ORDER — TRELEGY ELLIPTA 100-62.5-25 MCG/INH IN AEPB: 1.0000 | INHALATION_SPRAY | Freq: Every day | RESPIRATORY_TRACT | 0 refills | Status: DC

## 2020-07-16 MED ORDER — PREDNISONE 10 MG PO TABS: ORAL_TABLET | ORAL | 0 refills | Status: DC

## 2020-07-16 MED ORDER — METHYLPREDNISOLONE ACETATE 80 MG/ML IJ SUSP
80.0000 mg | Freq: Once | INTRAMUSCULAR | Status: AC
  Administered 2020-07-16: 80 mg via INTRAMUSCULAR

## 2020-07-16 NOTE — Patient Instructions (Signed)
You had the steroid shot today  Please take all new medication as prescribed - the prednisone  Please continue all other medications as before, including to restart the Trelegy  Please have the pharmacy call with any other refills you may need.  Please continue your efforts at being more active, low cholesterol diet, and weight control.  You are otherwise up to date with prevention measures today.  Please keep your appointments with your specialists as you may have planned - Dr Lamonte Sakai early May 2022  Please make an Appointment to return in 6 months, or sooner if needed, also with Lab Appointment for testing done 3-5 days before at the [facility] (so this is for TWO appointments - please see the scheduling desk as you leave)  Due to the ongoing Covid 19 pandemic, our lab now requires an appointment for any labs done at our office.  If you need labs done and do not have an appointment, please call our office ahead of time to schedule before presenting to the lab for your testing.

## 2020-07-16 NOTE — Progress Notes (Signed)
Patient ID: Crystal Thornton, female   DOB: 21-Jun-1945, 75 y.o.   MRN: [id number]         Chief Complaint:: wellness exam and Follow-up  copd with sob/wheezing       HPI:  Crystal Thornton is a 75 y.o. female here for wellness exam; has appt for eye exam next wk, o/w up to date with preventive referrals and immunizations                Also c/o 3 days worsening acute on chronic sob, wheezing and increased wob after running out of trelegy, has appt next wk with pulm dr Lamonte Sakai, denies fever, chills or colored sputum and Pt denies chest pain, orthopnea, PND, increased LE swelling, palpitations, dizziness or syncope.   Pt denies polydipsia, polyuria,  Denies new focal neuro s/s.   Pt denies fever, wt loss, night sweats, loss of appetite, or other constitutional symptoms Wt Readings from Last 3 Encounters:  07/16/20 131 lb 6.4 oz (59.6 kg)  07/16/19 133 lb (60.3 kg)  06/27/19 134 lb 4.2 oz (60.9 kg)   BP Readings from Last 3 Encounters:  07/16/20 (!) 162/108  03/10/20 110/65  01/01/20 133/66   Immunization History  Administered Date(s) Administered  . Fluad Quad(high Dose 65+) 01/13/2019  . Influenza Split 01/04/2011  . Influenza Whole 01/10/2008, 05/31/2009, 11/30/2009  . Influenza, High Dose Seasonal PF 11/16/2016, 12/04/2017  . Influenza,inj,Quad PF,6+ Mos 01/21/2014, 11/24/2015  . Influenza-Unspecified 11/16/2016, 03/14/2020  . PFIZER(Purple Top)SARS-COV-2 Vaccination 05/21/2019, 06/18/2019, 03/14/2020  . Pneumococcal Conjugate-13 07/23/2013  . Pneumococcal Polysaccharide-23 05/31/2009, 06/29/2017  . Td 04/27/2005  . Tdap 11/24/2015  . Zoster 05/29/2008   Health Maintenance Due  Topic Date Due  . URINE MICROALBUMIN  06/30/2018  . COLONOSCOPY (Pts 45-78yrs Insurance coverage will need to be confirmed)  07/17/2020      Past Medical History:  Diagnosis Date  . ANGIOEDEMA 01/24/2010  . Anxiety   . ANXIETY 11/07/2006  . BACK PAIN 11/01/2007  . Cough 05/29/2008  . CYST, OVARIAN NEC/NOS  11/05/2006  . Depression   . DEPRESSION 11/07/2006  . Diabetes mellitus   . DIABETES MELLITUS, TYPE II 11/07/2006  . DIVERTICULOSIS, COLON 11/07/2006  . FIBROCYSTIC BREAST DISEASE 11/05/2006  . HEAD TRAUMA, CLOSED 01/24/2010  . HEPATITIS B, HX OF 11/05/2006  . Hepatitis C    Treated and no longer has  . HEPATITIS C 11/05/2006  . Hyperlipidemia   . HYPERLIPIDEMIA 11/07/2006  . Hypertension   . HYPERTENSION 05/29/2007  . INSOMNIA, HX OF 11/05/2006  . LOW BACK PAIN 05/29/2007  . Osteopenia   . OSTEOPENIA 11/07/2006  . Other specified sites of sprains and strains 08/31/2008  . Pain    R. shoulder and lower back and legs  . PULMONARY NODULE, LEFT UPPER LOBE 05/31/2009  . Seizures ([place])   . SHOULDER PAIN, RIGHT, CHRONIC 06/02/2010  . SINUSITIS- ACUTE-NOS 12/10/2007  . SKIN LESION 06/02/2010  . TOTAL ABDOMINAL HYSTERECTOMY, HX OF 03/27/1990  . Wears dentures    full top  . Wears glasses    Past Surgical History:  Procedure Laterality Date  . APPENDECTOMY    . CERVICAL FUSION  2013  . CESAREAN SECTION    . COLONOSCOPY    . EUS N/A 07/29/2015   Procedure: UPPER ENDOSCOPIC ULTRASOUND (EUS) LINEAR;  Surgeon: Milus Banister, MD;  Location: [facility];  Service: Endoscopy;  Laterality: N/A;  . EXTERNAL FIXATION LEG Left 12/12/2012   Procedure: LEFT ADJUSTMENT OF EXTERNAL FIXATION UNDER  ANESTHESIA;  Surgeon: Wylene Simmer, MD;  Location: [facility];  Service: Orthopedics;  Laterality: Left;  . LIVER BIOPSY    . ORIF ANKLE FRACTURE Left 12/03/2012  . ORIF ANKLE FRACTURE Left 01/02/2013   Procedure: LEFT ANKLE REMOVAL OF EXTERNAL FIXATOR OPEN REDUCTION INTERNAL FIXATION (ORIF) TRIMALLEOLAR FRACTURE;  Surgeon: Wylene Simmer, MD;  Location: [facility];  Service: Orthopedics;  Laterality: Left;  . OVARIAN CYST SURGERY     ruptured  . SHOULDER ARTHROSCOPY  2008   left  . TUBAL LIGATION      reports that she has been smoking cigarettes. She has a 12.50 pack-year smoking history. She has  never used smokeless tobacco. She reports that she does not drink alcohol and does not use drugs. family history includes Liver cancer in her mother. Allergies  Allergen Reactions  . Naproxen Sodium Anaphylaxis  . Sulfonamide Derivatives Other (See Comments)    Causes nephritis  . Belsomra [Suvorexant]     To not take per pain management  . Chantix [Varenicline Tartrate] Other (See Comments)  . Clonazepam     To not take per pain management  . Ace Inhibitors Swelling    Lip swelling   Current Outpatient Medications on File Prior to Visit  Medication Sig Dispense Refill  . albuterol (PROVENTIL HFA;VENTOLIN HFA) 108 (90 Base) MCG/ACT inhaler Inhale 2 puffs into the lungs every 6 (six) hours as needed for wheezing or shortness of breath. 3 Inhaler 0  . albuterol (PROVENTIL) (2.5 MG/3ML) 0.083% nebulizer solution Take 3 mLs (2.5 mg total) by nebulization every 6 (six) hours as needed for wheezing or shortness of breath. DX: J44.9 360 mL 2  . ARIPiprazole (ABILIFY) 2 MG tablet Take 1 tablet (2 mg total) by mouth daily. 90 tablet 3  . atenolol (TENORMIN) 50 MG tablet TAKE 1 TABLET BY MOUTH DAILY 90 tablet 2  . Blood Glucose Monitoring Suppl (ONETOUCH VERIO FLEX SYSTEM) w/Device KIT Use as directed daily E11.9 1 kit 0  . buPROPion (WELLBUTRIN XL) 300 MG 24 hr tablet Take 1 tablet (300 mg total) by mouth daily. 90 tablet 3  . Ginseng 100 MG CAPS Take 100 mg by mouth daily.    Marland Kitchen glucose blood (ONETOUCH VERIO) test strip Use as instructed daily E11.9 100 each 12  . metFORMIN (GLUCOPHAGE) 500 MG tablet Take 2 tablets (1,000 mg total) by mouth 2 (two) times daily. 360 tablet 3  . Misc Natural Products (GINKOGIN PO) Take by mouth.    . Multiple Vitamins-Minerals (CENTRUM SILVER PO) Take 1 tablet by mouth daily.    . sertraline (ZOLOFT) 100 MG tablet TAKE 2 TABLETS(200 MG) BY MOUTH DAILY 180 tablet 1   No current facility-administered medications on file prior to visit.        ROS:  All others  reviewed and negative.  Objective        PE:  BP (!) 162/108 (BP Location: Left Arm, Patient Position: Sitting, Cuff Size: Normal)   Pulse 74   Temp 98.6 F (37 C) (Oral)   Ht $R'5\' 7"'lO$  (1.702 m)   Wt 131 lb 6.4 oz (59.6 kg)   SpO2 97%   BMI 20.58 kg/m                 Constitutional: Pt appears in NAD               HENT: Head: NCAT.  Right Ear: External ear normal.                 Left Ear: External ear normal.                Eyes: . Pupils are equal, round, and reactive to light. Conjunctivae and EOM are normal               Nose: without d/c or deformity               Neck: Neck supple. Gross normal ROM               Cardiovascular: Normal rate and regular rhythm.                 Pulmonary/Chest: Effort normal and breath sounds without rales but with bilat wheezing.                Abd:  Soft, NT, ND, + BS, no organomegaly               Neurological: Pt is alert. At baseline orientation, motor grossly intact               Skin: Skin is warm. No rashes, no other new lesions, LE edema - none               Psychiatric: Pt behavior is normal without agitation   Micro: none  Cardiac tracings I have personally interpreted today:  none  Pertinent Radiological findings (summarize): none   Lab Results  Component Value Date   WBC 12.8 (H) 03/10/2020   HGB 14.9 03/10/2020   HCT 45.6 03/10/2020   PLT 218 03/10/2020   GLUCOSE 111 (H) 07/07/2020   CHOL 184 07/07/2020   TRIG 145.0 07/07/2020   HDL 53.00 07/07/2020   LDLDIRECT 150.0 08/11/2019   LDLCALC 102 (H) 07/07/2020   ALT 17 07/07/2020   AST 16 07/07/2020   NA 141 07/07/2020   K 5.1 07/07/2020   CL 103 07/07/2020   CREATININE 0.90 07/07/2020   BUN 20 07/07/2020   CO2 29 07/07/2020   TSH 0.44 08/02/2018   HGBA1C 6.5 07/07/2020   MICROALBUR 13.4 (H) 06/29/2017   Assessment/Plan:  Crystal Thornton is a 75 y.o. White or Caucasian [1] female with  has a past medical history of ANGIOEDEMA (01/24/2010), Anxiety,  ANXIETY (11/07/2006), BACK PAIN (11/01/2007), Cough (05/29/2008), CYST, OVARIAN NEC/NOS (11/05/2006), Depression, DEPRESSION (11/07/2006), Diabetes mellitus, DIABETES MELLITUS, TYPE II (11/07/2006), DIVERTICULOSIS, COLON (11/07/2006), FIBROCYSTIC BREAST DISEASE (11/05/2006), HEAD TRAUMA, CLOSED (01/24/2010), HEPATITIS B, HX OF (11/05/2006), Hepatitis C, HEPATITIS C (11/05/2006), Hyperlipidemia, HYPERLIPIDEMIA (11/07/2006), Hypertension, HYPERTENSION (05/29/2007), INSOMNIA, HX OF (11/05/2006), LOW BACK PAIN (05/29/2007), Osteopenia, OSTEOPENIA (11/07/2006), Other specified sites of sprains and strains (08/31/2008), Pain, PULMONARY NODULE, LEFT UPPER LOBE (05/31/2009), Seizures ([place]), SHOULDER PAIN, RIGHT, CHRONIC (06/02/2010), SINUSITIS- ACUTE-NOS (12/10/2007), SKIN LESION (06/02/2010), TOTAL ABDOMINAL HYSTERECTOMY, HX OF (03/27/1990), Wears dentures, and Wears glasses.  Encounter for well adult exam with abnormal findings Age and sex appropriate education and counseling updated with regular exercise and diet Referrals for preventative services - has eye exam next wk Immunizations addressed - none needed Smoking counseling  - none needed Evidence for depression or other mood disorder - none significant Most recent labs reviewed. I have personally reviewed and have noted: 1) the patient's medical and social history 2) The patient's current medications and supplements 3) The patient's height, weight, and BMI have been recorded in the chart   COPD exacerbation (HCC) Mild  to mod, for depomedrol im 80, predpac asd,  Restart trelegy, f/u pulm next wk, and to f/u any worsening symptoms or concerns  Essential hypertension BP Readings from Last 3 Encounters:  07/16/20 (!) 162/108  03/10/20 110/65  01/01/20 133/66   Stable, pt to continue medical treatment tenormin   Hyperlipidemia Lab Results  Component Value Date   LDLCALC 102 (H) 07/07/2020   Stable, pt to continue current low chol diet - declines statin for  now   Diabetes mellitus type 2, uncontrolled ([place]) Lab Results  Component Value Date   HGBA1C 6.5 07/07/2020   Stable, pt to continue current medical treatment metformin   Followup: Return in about 6 months (around 01/15/2021).  Cathlean Cower, MD 07/18/2020 10:58 AM [facility]

## 2020-07-18 ENCOUNTER — Encounter: Payer: Self-pay | Admitting: Internal Medicine

## 2020-07-18 NOTE — Addendum Note (Signed)
Addended by: Biagio Borg on: 07/18/2020 11:00 AM   Modules accepted: Orders

## 2020-07-18 NOTE — Assessment & Plan Note (Signed)
Age and sex appropriate education and counseling updated with regular exercise and diet Referrals for preventative services - has eye exam next wk Immunizations addressed - none needed Smoking counseling  - none needed Evidence for depression or other mood disorder - none significant Most recent labs reviewed. I have personally reviewed and have noted: 1) the patient's medical and social history 2) The patient's current medications and supplements 3) The patient's height, weight, and BMI have been recorded in the chart

## 2020-07-18 NOTE — Assessment & Plan Note (Addendum)
Mild to mod, for depomedrol im 80, predpac asd,  Restart trelegy, f/u pulm next wk, and to f/u any worsening symptoms or concerns

## 2020-07-18 NOTE — Assessment & Plan Note (Signed)
Lab Results  Component Value Date   LDLCALC 102 (H) 07/07/2020   Stable, pt to continue current low chol diet - declines statin for now

## 2020-07-18 NOTE — Assessment & Plan Note (Signed)
BP Readings from Last 3 Encounters:  07/16/20 (!) 162/108  03/10/20 110/65  01/01/20 133/66   Stable, pt to continue medical treatment tenormin

## 2020-07-18 NOTE — Assessment & Plan Note (Signed)
Lab Results  Component Value Date   HGBA1C 6.5 07/07/2020   Stable, pt to continue current medical treatment metformin

## 2020-07-27 LAB — HM DIABETES EYE EXAM

## 2020-08-07 ENCOUNTER — Other Ambulatory Visit: Payer: Self-pay | Admitting: Internal Medicine

## 2020-08-07 DIAGNOSIS — F32A Depression, unspecified: Secondary | ICD-10-CM

## 2020-08-09 ENCOUNTER — Encounter: Payer: Self-pay | Admitting: Internal Medicine

## 2020-08-10 ENCOUNTER — Ambulatory Visit: Payer: Medicare Other | Admitting: Emergency Medicine

## 2020-08-10 ENCOUNTER — Encounter: Payer: Self-pay | Admitting: Emergency Medicine

## 2020-08-10 ENCOUNTER — Other Ambulatory Visit: Payer: Self-pay

## 2020-08-10 DIAGNOSIS — J439 Emphysema, unspecified: Secondary | ICD-10-CM | POA: Diagnosis not present

## 2020-08-10 DIAGNOSIS — J209 Acute bronchitis, unspecified: Secondary | ICD-10-CM

## 2020-08-10 DIAGNOSIS — F17200 Nicotine dependence, unspecified, uncomplicated: Secondary | ICD-10-CM | POA: Diagnosis not present

## 2020-08-10 MED ORDER — BREZTRI AEROSPHERE 160-9-4.8 MCG/ACT IN AERO: 2.0000 | INHALATION_SPRAY | Freq: Two times a day (BID) | RESPIRATORY_TRACT | 5 refills | Status: AC

## 2020-08-10 MED ORDER — DOXYCYCLINE HYCLATE 100 MG PO TABS: 100.0000 mg | ORAL_TABLET | Freq: Two times a day (BID) | ORAL | 0 refills | Status: AC

## 2020-08-10 NOTE — Assessment & Plan Note (Signed)
She has cut down.  Discussed the importance of smoking cessation with her today.  She states that Wellbutrin has been very beneficial.  We can talk about strategies to stop altogether at her next visit.

## 2020-08-10 NOTE — Addendum Note (Signed)
Addended by: Valerie Salts on: 08/10/2020 05:06 PM   Modules accepted: Orders

## 2020-08-10 NOTE — Progress Notes (Signed)
Subjective:    Patient ID: Crystal Thornton, female    DOB: December 27, 1945, 75 y.o.   MRN: [id number]  COPD She complains of cough and wheezing. There is no shortness of breath. Pertinent negatives include no ear pain, fever, headaches, postnasal drip, rhinorrhea, sneezing, sore throat or trouble swallowing. Her past medical history is significant for COPD.    ROV 02/25/2019 --follow-up visit for 75 year old woman with active tobacco use, hypertension, diabetes, hepatitis C, seizures and severe COPD.  Currently managed on trilogy since last year.  She has albuterol which she uses approximately.  She reports today that she has smoked as few as 5 cig a day, now up to 10 a day. She feels that the Trelegy has been beneficial  - less dyspnea. She has daily cough, yellow mucous. She uses albuterol HFA about 2x a day. She has borrowed nebulized albuterol before and has benefited. Flu shot up to date. No flares since last time.   ROV 08/10/20 --follow-up visit for 75 year old woman.  She is an active smoker (60 pk-yrs) and has a history of severe COPD.  Her other past medical history significant for hypertension, diabetes, hep C, seizures.  Currently smoking 5 cig a day. She is currently on Trelegy, feels that she is benefiting. Uses albuterol most mornings, occasionally throughout the day. sha had an AE-COPD in October, was treated with pred and abx. Averages 1-2 flares a year. She is having some increase sputum, colored mucous. Occasional wheeze.  Her COVID vaccine is up to date.    Review of Systems  Constitutional: Negative for fever and unexpected weight change.  HENT: Negative for congestion, dental problem, ear pain, nosebleeds, postnasal drip, rhinorrhea, sinus pressure, sneezing, sore throat and trouble swallowing.   Eyes: Negative for redness and itching.  Respiratory: Positive for cough and wheezing. Negative for chest tightness and shortness of breath.   Cardiovascular: Negative for palpitations and  leg swelling.  Gastrointestinal: Negative for nausea and vomiting.  Genitourinary: Negative for dysuria.  Musculoskeletal: Negative for joint swelling.  Skin: Negative for rash.  Neurological: Negative for headaches.  Hematological: Does not bruise/bleed easily.  Psychiatric/Behavioral: Negative for dysphoric mood. The patient is not nervous/anxious.     Past Medical History:  Diagnosis Date  . ANGIOEDEMA 01/24/2010  . Anxiety   . ANXIETY 11/07/2006  . BACK PAIN 11/01/2007  . Cough 05/29/2008  . CYST, OVARIAN NEC/NOS 11/05/2006  . Depression   . DEPRESSION 11/07/2006  . Diabetes mellitus   . DIABETES MELLITUS, TYPE II 11/07/2006  . DIVERTICULOSIS, COLON 11/07/2006  . FIBROCYSTIC BREAST DISEASE 11/05/2006  . HEAD TRAUMA, CLOSED 01/24/2010  . HEPATITIS B, HX OF 11/05/2006  . Hepatitis C    Treated and no longer has  . HEPATITIS C 11/05/2006  . Hyperlipidemia   . HYPERLIPIDEMIA 11/07/2006  . Hypertension   . HYPERTENSION 05/29/2007  . INSOMNIA, HX OF 11/05/2006  . LOW BACK PAIN 05/29/2007  . Osteopenia   . OSTEOPENIA 11/07/2006  . Other specified sites of sprains and strains 08/31/2008  . Pain    R. shoulder and lower back and legs  . PULMONARY NODULE, LEFT UPPER LOBE 05/31/2009  . Seizures ([place])   . SHOULDER PAIN, RIGHT, CHRONIC 06/02/2010  . SINUSITIS- ACUTE-NOS 12/10/2007  . SKIN LESION 06/02/2010  . TOTAL ABDOMINAL HYSTERECTOMY, HX OF 03/27/1990  . Wears dentures    full top  . Wears glasses      Family History  Problem Relation Age of Onset  . Liver  cancer Mother   . Breast cancer Neg Hx      Social History   Socioeconomic History  . Marital status: Divorced    Spouse name: Not on file  . Number of children: Not on file  . Years of education: Not on file  . Highest education level: Not on file  Occupational History  . Not on file  Tobacco Use  . Smoking status: Current Every Day Smoker    Packs/day: 0.25    Years: 50.00    Pack years: 12.50    Types: Cigarettes  .  Smokeless tobacco: Never Used  Vaping Use  . Vaping Use: Never used  Substance and Sexual Activity  . Alcohol use: No  . Drug use: No  . Sexual activity: Not on file    Comment: trying to cut down  Other Topics Concern  . Not on file  Social History Narrative  . Not on file   Social Determinants of Health   Financial Resource Strain: Not on file  Food Insecurity: Not on file  Transportation Needs: Not on file  Physical Activity: Not on file  Stress: Not on file  Social Connections: Not on file  Intimate Partner Violence: Not on file  She worked as a Radio broadcast assistant, office job Lived in [place], [place], [place]  Allergies  Allergen Reactions  . Naproxen Sodium Anaphylaxis  . Sulfonamide Derivatives Other (See Comments)    Causes nephritis  . Belsomra [Suvorexant]     To not take per pain management  . Chantix [Varenicline Tartrate] Other (See Comments)  . Clonazepam     To not take per pain management  . Ace Inhibitors Swelling    Lip swelling     Outpatient Medications Prior to Visit  Medication Sig Dispense Refill  . ARIPiprazole (ABILIFY) 2 MG tablet Take 1 tablet (2 mg total) by mouth daily. 90 tablet 3  . atenolol (TENORMIN) 50 MG tablet TAKE 1 TABLET BY MOUTH DAILY 90 tablet 2  . Blood Glucose Monitoring Suppl (ONETOUCH VERIO FLEX SYSTEM) w/Device KIT Use as directed daily E11.9 1 kit 0  . buPROPion (WELLBUTRIN XL) 300 MG 24 hr tablet Take 1 tablet (300 mg total) by mouth daily. 90 tablet 3  . Fluticasone-Umeclidin-Vilant (TRELEGY ELLIPTA) 100-62.5-25 MCG/INH AEPB Inhale 1 puff into the lungs daily. 60 each 0  . Ginseng 100 MG CAPS Take 100 mg by mouth daily.    Marland Kitchen glucose blood (ONETOUCH VERIO) test strip Use as instructed daily E11.9 100 each 12  . metFORMIN (GLUCOPHAGE) 500 MG tablet Take 2 tablets (1,000 mg total) by mouth 2 (two) times daily. 360 tablet 3  . Misc Natural Products (GINKOGIN PO) Take by mouth.    . Multiple Vitamins-Minerals (CENTRUM SILVER PO) Take 1 tablet by  mouth daily.    . sertraline (ZOLOFT) 100 MG tablet TAKE 2 TABLETS(200 MG) BY MOUTH DAILY 180 tablet 1  . albuterol (PROVENTIL HFA;VENTOLIN HFA) 108 (90 Base) MCG/ACT inhaler Inhale 2 puffs into the lungs every 6 (six) hours as needed for wheezing or shortness of breath. 3 Inhaler 0  . albuterol (PROVENTIL) (2.5 MG/3ML) 0.083% nebulizer solution Take 3 mLs (2.5 mg total) by nebulization every 6 (six) hours as needed for wheezing or shortness of breath. DX: J44.9 360 mL 2  . predniSONE (DELTASONE) 10 MG tablet 3 tabs by mouth per day for 3 days,2tabs per day for 3 days,1tab per day for 3 days 18 tablet 0   No facility-administered medications prior to visit.  Objective:   Physical Exam  Vitals:   08/10/20 1624  BP: 116/70  Pulse: 74  Temp: (!) 97 F (36.1 C)  TempSrc: Temporal  SpO2: 95%  Weight: 131 lb 6.4 oz (59.6 kg)  Height: _0  (1.702 m)   Gen: Pleasant, Thin woman, in no distress,  normal affect  ENT: No lesions,  mouth clear,  oropharynx clear, no postnasal drip, somewhat hoarse voice  Neck: No JVD, no stridor  Lungs: No use of accessory muscles, distant, some scattered inspiratory crackles, no wheezing on a normal breath, she dos wheeze on a forced exp   Cardiovascular: RRR, heart sounds normal, no murmur or gallops, no peripheral edema  Musculoskeletal: No deformities, no cyanosis or clubbing  Neuro: alert, non focal  Skin: Warm, no lesions or rashes     Assessment & Plan:  COPD (chronic obstructive pulmonary disease) (HCC) Overall stable interval since October when she had an acute exacerbation.  She feels that she is probably developing a bronchitis with purulent sputum but does not have any wheezing and no other signs of flaring.  We talked today about a trial of Breztri as a replacement for Trelegy to see if she gets more benefit.  She is interested in doing this.   Stop Trelegy.  We will temporarily start Breztri 2 puffs twice a day.  Rinse and  gargle after using.  If you get more benefit from this then call our office and let us know and we can send a prescription to your pharmacy. Keep your albuterol available to use 2 puffs if needed for shortness of breath, chest tightness, wheezing. COVID-19 vaccine is up-to-date Follow with Dr Lamonte Sakai in 6 months or sooner if you have any problems  Smoker She has cut down.  Discussed the importance of smoking cessation with her today.  She states that Wellbutrin has been very beneficial.  We can talk about strategies to stop altogether at her next visit.  Acute bronchitis Purulent sputum in absence of wheezing.  I will treat her for an acute bronchitis with doxycycline.  Baltazar Apo, MD, PhD 08/10/2020, 4:54 PM [facility] [phone] or if no answer [phone]

## 2020-08-10 NOTE — Assessment & Plan Note (Signed)
Overall stable interval since October when she had an acute exacerbation.  She feels that she is probably developing a bronchitis with purulent sputum but does not have any wheezing and no other signs of flaring.  We talked today about a trial of Breztri as a replacement for Trelegy to see if she gets more benefit.  She is interested in doing this.   Stop Trelegy.  We will temporarily start Breztri 2 puffs twice a day.  Rinse and gargle after using.  If you get more benefit from this then call our office and let us know and we can send a prescription to your pharmacy. Keep your albuterol available to use 2 puffs if needed for shortness of breath, chest tightness, wheezing. COVID-19 vaccine is up-to-date Follow with Dr Lamonte Sakai in 6 months or sooner if you have any problems

## 2020-08-10 NOTE — Assessment & Plan Note (Signed)
Purulent sputum in absence of wheezing.  I will treat her for an acute bronchitis with doxycycline.

## 2020-08-10 NOTE — Addendum Note (Signed)
Addended by: Valerie Salts on: 08/10/2020 05:07 PM   Modules accepted: Orders

## 2020-08-10 NOTE — Patient Instructions (Addendum)
Please take doxycycline 100 mg twice a day for 7 days.  Take until it is completely gone. Stop Trelegy.  We will temporarily start Breztri 2 puffs twice a day.  Rinse and gargle after using.  If you get more benefit from this then call our office and let us know and we can send a prescription to your pharmacy. Keep your albuterol available to use 2 puffs if needed for shortness of breath, chest tightness, wheezing. Work hard on controlling your smoking.  Ultimate goal will be to stop altogether. COVID-19 vaccine is up-to-date Follow with Dr Lamonte Sakai in 6 months or sooner if you have any problems

## 2020-09-15 ENCOUNTER — Other Ambulatory Visit: Payer: Self-pay | Admitting: Internal Medicine

## 2020-09-15 ENCOUNTER — Other Ambulatory Visit: Payer: Self-pay | Admitting: Emergency Medicine

## 2020-09-15 DIAGNOSIS — E119 Type 2 diabetes mellitus without complications: Secondary | ICD-10-CM

## 2020-09-15 NOTE — Telephone Encounter (Signed)
Only for metformin  Please refill as per office routine med refill policy (all routine meds refilled for 3 mo or monthly per pt preference up to one year from last visit, then month to month grace period for 3 mo, then further med refills will have to be denied)

## 2020-09-19 ENCOUNTER — Other Ambulatory Visit: Payer: Self-pay | Admitting: Internal Medicine

## 2020-10-21 ENCOUNTER — Emergency Department (HOSPITAL_COMMUNITY): Payer: Medicare Other

## 2020-10-21 ENCOUNTER — Emergency Department (HOSPITAL_COMMUNITY)
Admission: EM | Admit: 2020-10-21 | Discharge: 2020-10-25 | Disposition: E | Payer: Medicare Other | Attending: Emergency Medicine | Admitting: Emergency Medicine

## 2020-10-21 DIAGNOSIS — E875 Hyperkalemia: Secondary | ICD-10-CM | POA: Insufficient documentation

## 2020-10-21 DIAGNOSIS — Z7984 Long term (current) use of oral hypoglycemic drugs: Secondary | ICD-10-CM | POA: Insufficient documentation

## 2020-10-21 DIAGNOSIS — E119 Type 2 diabetes mellitus without complications: Secondary | ICD-10-CM | POA: Diagnosis not present

## 2020-10-21 DIAGNOSIS — J449 Chronic obstructive pulmonary disease, unspecified: Secondary | ICD-10-CM | POA: Insufficient documentation

## 2020-10-21 DIAGNOSIS — I469 Cardiac arrest, cause unspecified: Secondary | ICD-10-CM

## 2020-10-21 DIAGNOSIS — F17210 Nicotine dependence, cigarettes, uncomplicated: Secondary | ICD-10-CM | POA: Diagnosis not present

## 2020-10-21 DIAGNOSIS — R000 Tachycardia, unspecified: Secondary | ICD-10-CM | POA: Diagnosis not present

## 2020-10-21 DIAGNOSIS — R0689 Other abnormalities of breathing: Secondary | ICD-10-CM | POA: Diagnosis not present

## 2020-10-21 DIAGNOSIS — Z7951 Long term (current) use of inhaled steroids: Secondary | ICD-10-CM | POA: Diagnosis not present

## 2020-10-21 DIAGNOSIS — I499 Cardiac arrhythmia, unspecified: Secondary | ICD-10-CM | POA: Diagnosis not present

## 2020-10-21 DIAGNOSIS — R739 Hyperglycemia, unspecified: Secondary | ICD-10-CM | POA: Diagnosis not present

## 2020-10-21 DIAGNOSIS — E872 Acidosis, unspecified: Secondary | ICD-10-CM

## 2020-10-21 DIAGNOSIS — I10 Essential (primary) hypertension: Secondary | ICD-10-CM | POA: Insufficient documentation

## 2020-10-21 DIAGNOSIS — R404 Transient alteration of awareness: Secondary | ICD-10-CM | POA: Diagnosis not present

## 2020-10-21 LAB — I-STAT CHEM 8, ED
BUN: 32 mg/dL — ABNORMAL HIGH (ref 8–23)
Calcium, Ion: 1.12 mmol/L — ABNORMAL LOW (ref 1.15–1.40)
Chloride: 118 mmol/L — ABNORMAL HIGH (ref 98–111)
Creatinine, Ser: 1.2 mg/dL — ABNORMAL HIGH (ref 0.44–1.00)
Glucose, Bld: 233 mg/dL — ABNORMAL HIGH (ref 70–99)
HCT: 30 % — ABNORMAL LOW (ref 36.0–46.0)
Hemoglobin: 10.2 g/dL — ABNORMAL LOW (ref 12.0–15.0)
Potassium: 7.7 mmol/L (ref 3.5–5.1)
Sodium: 144 mmol/L (ref 135–145)
TCO2: 16 mmol/L — ABNORMAL LOW (ref 22–32)

## 2020-10-21 LAB — CBC WITH DIFFERENTIAL/PLATELET
Abs Immature Granulocytes: 1.82 10*3/uL — ABNORMAL HIGH (ref 0.00–0.07)
Basophils Absolute: 0 10*3/uL (ref 0.0–0.1)
Basophils Relative: 0 %
Eosinophils Absolute: 0.2 10*3/uL (ref 0.0–0.5)
Eosinophils Relative: 1 %
HCT: 38 % (ref 36.0–46.0)
Hemoglobin: 11.3 g/dL — ABNORMAL LOW (ref 12.0–15.0)
Immature Granulocytes: 16 %
Lymphocytes Relative: 33 %
Lymphs Abs: 3.7 10*3/uL (ref 0.7–4.0)
MCH: 32.1 pg (ref 26.0–34.0)
MCHC: 29.7 g/dL — ABNORMAL LOW (ref 30.0–36.0)
MCV: 108 fL — ABNORMAL HIGH (ref 80.0–100.0)
Monocytes Absolute: 0.2 10*3/uL (ref 0.1–1.0)
Monocytes Relative: 2 %
Neutro Abs: 5.4 10*3/uL (ref 1.7–7.7)
Neutrophils Relative %: 48 %
Platelets: 65 10*3/uL — ABNORMAL LOW (ref 150–400)
RBC: 3.52 MIL/uL — ABNORMAL LOW (ref 3.87–5.11)
RDW: 13.7 % (ref 11.5–15.5)
Smear Review: DECREASED
WBC: 11.3 10*3/uL — ABNORMAL HIGH (ref 4.0–10.5)
nRBC: 0.6 % — ABNORMAL HIGH (ref 0.0–0.2)

## 2020-10-21 LAB — I-STAT ARTERIAL BLOOD GAS, ED
Acid-base deficit: 12 mmol/L — ABNORMAL HIGH (ref 0.0–2.0)
Bicarbonate: 18.4 mmol/L — ABNORMAL LOW (ref 20.0–28.0)
Calcium, Ion: 1.36 mmol/L (ref 1.15–1.40)
HCT: 31 % — ABNORMAL LOW (ref 36.0–46.0)
Hemoglobin: 10.5 g/dL — ABNORMAL LOW (ref 12.0–15.0)
O2 Saturation: 100 %
Patient temperature: 98.6
Potassium: >8.5 mmol/L (ref 3.5–5.1)
Sodium: 140 mmol/L (ref 135–145)
TCO2: 20 mmol/L — ABNORMAL LOW (ref 22–32)
pCO2 arterial: 66 mmHg (ref 32.0–48.0)
pH, Arterial: 7.054 — CL (ref 7.350–7.450)
pO2, Arterial: 387 mmHg — ABNORMAL HIGH (ref 83.0–108.0)

## 2020-10-21 LAB — LACTIC ACID, PLASMA: Lactic Acid, Venous: 10.6 mmol/L (ref 0.5–1.9)

## 2020-10-21 MED ORDER — CALCIUM CHLORIDE 10 % IV SOLN
INTRAVENOUS | Status: DC | PRN
  Administered 2020-10-21: 1 g via INTRAVENOUS

## 2020-10-21 MED ORDER — SODIUM CHLORIDE 0.9 % IV SOLN
INTRAVENOUS | Status: DC | PRN
  Administered 2020-10-21: 1000 mL via INTRAVENOUS

## 2020-10-21 MED ORDER — CALCIUM CHLORIDE 10 % IV SOLN
INTRAVENOUS | Status: AC | PRN
Start: 2020-10-21 — End: 2020-10-21
  Administered 2020-10-21: 1 g via INTRAVENOUS

## 2020-10-21 MED ORDER — EPINEPHRINE 1 MG/10ML IJ SOSY
PREFILLED_SYRINGE | INTRAMUSCULAR | Status: AC | PRN
  Administered 2020-10-21: 1 mg via INTRAVENOUS

## 2020-10-21 MED ORDER — EPINEPHRINE 1 MG/10ML IJ SOSY
PREFILLED_SYRINGE | INTRAMUSCULAR | Status: DC | PRN
  Administered 2020-10-21 (×4): 1 mg via INTRAVENOUS

## 2020-10-21 MED ORDER — SODIUM BICARBONATE 8.4 % IV SOLN
INTRAVENOUS | Status: AC | PRN
Start: 2020-10-21 — End: 2020-10-21
  Administered 2020-10-21 (×2): 50 meq via INTRAVENOUS

## 2020-10-21 MED ORDER — ALBUTEROL (5 MG/ML) CONTINUOUS INHALATION SOLN
INHALATION_SOLUTION | RESPIRATORY_TRACT | Status: AC
  Filled 2020-10-21: qty 20

## 2020-10-21 MED ORDER — INSULIN ASPART 100 UNIT/ML IV SOLN
10.0000 [IU] | Freq: Once | INTRAVENOUS | Status: AC
  Administered 2020-10-21: 10 [IU] via INTRAVENOUS

## 2020-10-21 MED ORDER — EPINEPHRINE HCL 5 MG/250ML IV SOLN IN NS
0.5000 ug/min | INTRAVENOUS | Status: DC
  Administered 2020-10-21: 20 ug/min via INTRAVENOUS

## 2020-10-21 MED ORDER — DEXTROSE 50 % IV SOLN
1.0000 | Freq: Once | INTRAVENOUS | Status: AC
  Administered 2020-10-21: 50 mL via INTRAVENOUS

## 2020-10-25 NOTE — Code Documentation (Signed)
CPr initiated

## 2020-10-25 NOTE — Code Documentation (Signed)
Pulse check; asystole; vtach--shocked at 200 joules. CPR resumed

## 2020-10-25 NOTE — Progress Notes (Signed)
   2020-11-01 2200  Clinical Encounter Type  Visited With Patient not available  Visit Type Death  Referral From Nurse  Consult/Referral To Chaplain  Spiritual Encounters  Spiritual Needs Emotional;Grief support  The chaplain spoke with the patient's son Rodman Key) he was looking for support with planning for his mother's funeral arrangements. The son reports that this is his first time dealing with death on this level. The son is looking for any resources that may be available to help assist him with making some final arrangements for his mother. The chaplain spoke with the son and provided emotional support along with grief support/ The chaplain will speak with the spiritual care department about additional resources and follow up as needed.

## 2020-10-25 NOTE — Code Documentation (Signed)
Pulse check, asystole 

## 2020-10-25 NOTE — Code Documentation (Signed)
Pulse check: PEA CPR resumed 

## 2020-10-25 NOTE — Code Documentation (Addendum)
Pulse check, shockable rhythm, shocked at 200 joules. CPR initiated

## 2020-10-25 NOTE — Code Documentation (Signed)
Pt has pulse and is currently being paced at 70 BPM at 138 mA

## 2020-10-25 NOTE — Code Documentation (Signed)
Pulse check asystole

## 2020-10-25 NOTE — Code Documentation (Signed)
Pulse check shows asystole; CPR continued

## 2020-10-25 NOTE — ED Notes (Signed)
Pulse check; asystole; CPR resumed

## 2020-10-25 NOTE — ED Provider Notes (Signed)
Crystal Thornton   CSN: [id number] Arrival date & time: 2020/11/18  2026     History Chief Complaint  Patient presents with   Cardiac Arrest    Crystal Thornton is a 75 y.o. female.  The history is provided by the EMS personnel.  Cardiac Arrest Witnessed by:  Not witnessed Incident location:  Home Time before ALS initiated:  > 10 minutes Condition upon EMS arrival:  Unresponsive Pulse:  Absent Initial cardiac rhythm per EMS:  Junctional (Wide complex rhythm per EMS) Treatments prior to arrival:  ACLS protocol Medications given prior to ED:  Epinephrine Airway:  Intubation in ED Rhythm on admission to ED: Paced rhythm.     Past Medical History:  Diagnosis Date   ANGIOEDEMA 01/24/2010   Anxiety    ANXIETY 11/07/2006   BACK PAIN 11/01/2007   Cough 05/29/2008   CYST, OVARIAN NEC/NOS 11/05/2006   Depression    DEPRESSION 11/07/2006   Diabetes mellitus    DIABETES MELLITUS, TYPE II 11/07/2006   DIVERTICULOSIS, COLON 11/07/2006   FIBROCYSTIC BREAST DISEASE 11/05/2006   HEAD TRAUMA, CLOSED 01/24/2010   HEPATITIS B, HX OF 11/05/2006   Hepatitis C    Treated and no longer has   HEPATITIS C 11/05/2006   Hyperlipidemia    HYPERLIPIDEMIA 11/07/2006   Hypertension    HYPERTENSION 05/29/2007   INSOMNIA, HX OF 11/05/2006   LOW BACK PAIN 05/29/2007   Osteopenia    OSTEOPENIA 11/07/2006   Other specified sites of sprains and strains 08/31/2008   Pain    R. shoulder and lower back and legs   PULMONARY NODULE, LEFT UPPER LOBE 05/31/2009   Seizures ([place])    SHOULDER PAIN, RIGHT, CHRONIC 06/02/2010   SINUSITIS- ACUTE-NOS 12/10/2007   SKIN LESION 06/02/2010   TOTAL ABDOMINAL HYSTERECTOMY, HX OF 03/27/1990   Wears dentures    full top   Wears glasses     Patient Active Problem List   Diagnosis Date Noted   Major depression 03/15/2020   Hearing loss 07/16/2019   Community acquired pneumonia 06/26/2019   Acute respiratory failure with hypoxia ([place])  06/26/2019   Syncope 04/24/2017   [facility] spotted fever) 12/25/2016   COPD (chronic obstructive pulmonary disease) ([place]) 11/16/2016   Rash 11/16/2016   Choledocholithiasis 06/30/2015   Urinary urgency 06/24/2015   Right flank pain 06/24/2015   RUQ pain 06/24/2015   Delusions ([place]) 11/03/2014   Hallucinations 11/03/2014   Chronic pain syndrome    Seizure ([place]) 11/01/2014   Smoker 08/12/2014   Acute bronchitis 01/04/2011   Encounter for well adult exam with abnormal findings 11/02/2010   Disorder of skin or subcutaneous tissue 06/02/2010   SHOULDER PAIN, RIGHT, CHRONIC 06/02/2010   HEAD TRAUMA, CLOSED 01/24/2010   ANGIOEDEMA 01/24/2010   Pulmonary nodule, left 05/31/2009   Other specified sites of sprains and strains 08/31/2008   Cough 05/29/2008   BACK PAIN 11/01/2007   Essential hypertension 05/29/2007   LOW BACK PAIN 05/29/2007   Diabetes mellitus type 2, uncontrolled ([place]) 11/07/2006   Hyperlipidemia 11/07/2006   Anxiety state 11/07/2006   Depression 11/07/2006   DIVERTICULOSIS, COLON 11/07/2006   OSTEOPENIA 11/07/2006   HEPATITIS C 11/05/2006   FIBROCYSTIC BREAST DISEASE 11/05/2006   CYST, OVARIAN NEC/NOS 11/05/2006   HEPATITIS B, HX OF 11/05/2006   Insomnia 11/05/2006   TOTAL ABDOMINAL HYSTERECTOMY, HX OF 03/27/1990    Past Surgical History:  Procedure Laterality Date   APPENDECTOMY     CERVICAL  FUSION  2013   CESAREAN SECTION     COLONOSCOPY     EUS N/A 07/29/2015   Procedure: UPPER ENDOSCOPIC ULTRASOUND (EUS) LINEAR;  Surgeon: Milus Banister, MD;  Location: [facility];  Service: Endoscopy;  Laterality: N/A;   EXTERNAL FIXATION LEG Left 12/12/2012   Procedure: LEFT ADJUSTMENT OF EXTERNAL FIXATION UNDER ANESTHESIA;  Surgeon: Wylene Simmer, MD;  Location: [facility];  Service: Orthopedics;  Laterality: Left;   LIVER BIOPSY     ORIF ANKLE FRACTURE Left 12/03/2012   ORIF ANKLE FRACTURE Left 01/02/2013   Procedure: LEFT ANKLE REMOVAL OF  EXTERNAL FIXATOR OPEN REDUCTION INTERNAL FIXATION (ORIF) TRIMALLEOLAR FRACTURE;  Surgeon: Wylene Simmer, MD;  Location: [facility];  Service: Orthopedics;  Laterality: Left;   OVARIAN CYST SURGERY     ruptured   SHOULDER ARTHROSCOPY  2008   left   TUBAL LIGATION       OB History   No obstetric history on file.     Family History  Problem Relation Age of Onset   Liver cancer Mother    Breast cancer Neg Hx     Social History   Tobacco Use   Smoking status: Every Day    Packs/day: 0.25    Years: 50.00    Pack years: 12.50    Types: Cigarettes   Smokeless tobacco: Never  Vaping Use   Vaping Use: Never used  Substance Use Topics   Alcohol use: No   Drug use: No    Home Medications Prior to Admission medications   Medication Sig Start Date End Date Taking? Authorizing Provider  ARIPiprazole (ABILIFY) 2 MG tablet Take 1 tablet (2 mg total) by mouth daily. 03/15/20   Biagio Borg, MD  atenolol (TENORMIN) 50 MG tablet TAKE 1 TABLET BY MOUTH DAILY 03/09/20   Biagio Borg, MD  Blood Glucose Monitoring Suppl ([place]) w/Device KIT Use as directed daily E11.9 10/31/18   Biagio Borg, MD  Budeson-Glycopyrrol-Formoterol (BREZTRI AEROSPHERE) 160-9-4.8 MCG/ACT AERO Inhale 2 puffs into the lungs in the morning and at bedtime. 08/10/20   Collene Gobble, MD  buPROPion (WELLBUTRIN XL) 150 MG 24 hr tablet TAKE 1 TABLET BY MOUTH  DAILY 09/20/20   Biagio Borg, MD  buPROPion (WELLBUTRIN XL) 300 MG 24 hr tablet Take 1 tablet (300 mg total) by mouth daily. 03/15/20   Biagio Borg, MD  doxycycline (VIBRA-TABS) 100 MG tablet Take 1 tablet (100 mg total) by mouth 2 (two) times daily. 08/10/20   Collene Gobble, MD  Ginseng 100 MG CAPS Take 100 mg by mouth daily.    [provider]  glucose blood (ONETOUCH VERIO) test strip Use as instructed daily E11.9 10/31/18   Biagio Borg, MD  metFORMIN (GLUCOPHAGE) 500 MG tablet TAKE 2 TABLETS BY MOUTH TWICE DAILY  09/15/20   Biagio Borg, MD  Misc Natural Products (GINKOGIN PO) Take by mouth.    [provider]  Multiple Vitamins-Minerals (CENTRUM SILVER PO) Take 1 tablet by mouth daily.    [provider]  sertraline (ZOLOFT) 100 MG tablet TAKE 2 TABLETS(200 MG) BY MOUTH DAILY 08/07/20   Biagio Borg, MD    Allergies    Naproxen sodium, Sulfonamide derivatives, Belsomra [suvorexant], Chantix [varenicline tartrate], Clonazepam, and Ace inhibitors  Review of Systems   Review of Systems  Unable to perform ROS: Acuity of condition   Physical Exam Updated Vital Signs BP (!) 97/57  Pulse (!) 114   Resp (!) 42   Ht '5\' 8"'  (1.727 m)   Wt 59.6 kg   SpO2 (!) 71%   BMI 19.98 kg/m   Physical Exam Vitals and nursing Thornton reviewed.  Constitutional:      Comments: Unresponsive, GCS 3T  HENT:     Head: Normocephalic and atraumatic.     Mouth/Throat:     Comments: King airway in place Eyes:     Comments: Pupils fixed and dilated  Cardiovascular:     Rate and Rhythm: Normal rate and regular rhythm.  Pulmonary:     Effort: Pulmonary effort is normal.     Breath sounds: Rhonchi present.     Comments: Bilateral breath sounds with King airway in place Abdominal:     General: Abdomen is flat. There is no distension.  Musculoskeletal:        General: No deformity or signs of injury.     Cervical back: Neck supple.  Skin:    Capillary Refill: Capillary refill takes more than 3 seconds.     Coloration: Skin is pale.  Neurological:     Comments: GCS3T, unresponsive    ED Results / Procedures / Treatments   Labs (all labs ordered are listed, but only abnormal results are displayed) Labs Reviewed  CBC WITH DIFFERENTIAL/PLATELET - Abnormal; Notable for the following components:      Result Value   WBC 11.3 (*)    RBC 3.52 (*)    Hemoglobin 11.3 (*)    MCV 108.0 (*)    MCHC 29.7 (*)    Platelets 65 (*)    nRBC 0.6 (*)    Abs Immature Granulocytes 1.82 (*)    All other  components within normal limits  LACTIC ACID, PLASMA - Abnormal; Notable for the following components:   Lactic Acid, Venous 10.6 (*)    All other components within normal limits  I-STAT ARTERIAL BLOOD GAS, ED - Abnormal; Notable for the following components:   pH, Arterial 7.054 (*)    pCO2 arterial 66.0 (*)    pO2, Arterial 387 (*)    Bicarbonate 18.4 (*)    TCO2 20 (*)    Acid-base deficit 12.0 (*)    Potassium >8.5 (*)    HCT 31.0 (*)    Hemoglobin 10.5 (*)    All other components within normal limits  I-STAT CHEM 8, ED - Abnormal; Notable for the following components:   Potassium 7.7 (*)    Chloride 118 (*)    BUN 32 (*)    Creatinine, Ser 1.20 (*)    Glucose, Bld 233 (*)    Calcium, Ion 1.12 (*)    TCO2 16 (*)    Hemoglobin 10.2 (*)    HCT 30.0 (*)    All other components within normal limits  TROPONIN I (HIGH SENSITIVITY)    EKG EKG Interpretation  Date/Time:  November 13, 2020 21:32:46 EDT Ventricular Rate:  98 PR Interval:  54 QRS Duration: 127 QT Interval:  383 QTC Calculation: 489 R Axis:   -68 Text Interpretation: Accelerated junctional rhythm Nonspecific IVCD with LAD Repol abnrm, severe global ischemia (LM/MVD) T wave abnormality in lateral leads Confirmed by Varney Biles ([id number]) on 10/22/2020 4:22:21 PM  Radiology No results found.  Procedures .Critical Care  Date/Time: 10/23/2020 11:11 AM Performed by: Regan Lemming, MD Authorized by: Regan Lemming, MD   Critical care provider statement:    Critical care time (minutes):  76   Critical care start  time:  05-Nov-2020 8:33 PM   Critical care end time:  2020-11-05 9:49 AM   Critical care was necessary to treat or prevent imminent or life-threatening deterioration of the following conditions:  Cardiac failure and circulatory failure   Critical care was time spent personally by me on the following activities:  Evaluation of patient's response to treatment, examination of patient, ordering and  performing treatments and interventions, ordering and review of laboratory studies, pulse oximetry, re-evaluation of patient's condition, obtaining history from patient or surrogate and review of old charts   Medications Ordered in ED Medications  EPINEPHrine (ADRENALIN) 1 MG/10ML injection (1 mg Intravenous Given 11/05/20 2046)  sodium bicarbonate injection (50 mEq Intravenous Given 11/05/20 2103)  calcium chloride injection (1 g Intravenous Given 11-05-2020 2047)  insulin aspart (novoLOG) injection 10 Units (10 Units Intravenous Given 11-05-2020 2109)    And  dextrose 50 % solution 50 mL (50 mLs Intravenous Given 2020/11/05 2107)  albuterol (VENTOLIN) (5 MG/ML) 0.5% continuous inhalation solution (  Given 11/05/20 2114)    ED Course  I have reviewed the triage vital signs and the nursing notes.  Pertinent labs & imaging results that were available during my care of the patient were reviewed by me and considered in my medical decision making (see chart for details).    MDM Rules/Calculators/A&P                           75 year old female presenting to the emergency department status post arrest.  The patient had an unknown downtime per EMS.  Family had last seen the patient 2 hours prior and had been endorsing symptoms of depression.  She was found unresponsive surrounded by multiple pill bottles.  Bystander CPR was initiated and EMS was called.  When EMS arrived on scene, the patient was found to be unresponsive and pulseless and CPR was continued.  Per EMS, the patient has had around a total of 30 minutes of CPR so far.  ROSC was obtained prior to arrival and the patient was placed on an epi drip by EMS.  The patient had a wide-complex rhythm and received shocks and calcium and bicarbonate from EMS.  The patient arrived obtunded, GCS 3 T with a King airway in place, actively being bagged and actively being paced by EMS, with a palpable femoral pulse, fixed and dilated pupils.  The patient arrived with  a left tibia IO and an 18-gauge PIV on the right.   The patient's epinephrine drip was continued and the transcutaneous cardiac pacing was continued.  The patient's Edison Pace airway was exchanged for an ETT per the residents previous procedure Thornton.  Ten minutes after intubation the patient lost pulses again and CPR was initiated and pacing was stopped.  CPR proceeded following ACLS protocols.  The patient's rhythm was at times PEA and at times wide-complex tachycardia for which she received shocks.  She was administered calcium chloride and sodium bicarbonate and code dose epinephrine.  ROSC was obtained.  An i-STAT Chem-8 revealed hyperkalemia to 7.7.  The patient was administered insulin and albuterol nebulizer through the ventilator in addition to her previously administered calcium chloride for treatment.  Following treatment, the patient's QRS notably narrowed on the cardiac monitor.  Review of the patient's home medications include Wellbutrin and Atenolol.  Critical care was consulted for admission but the patient subsequently lost pulses again and CPR was reinitiated.  After multiple rounds of CPR, ROSC was unable to be  obtained,  asystole was noted on the monitor, and the patient subsequently expired.  Time of death 06-06-47.  Family notified and present bedside. ME notified.   Final Clinical Impression(s) / ED Diagnoses Final diagnoses:  Cardiac arrest ([place])  Hyperkalemia  Lactic acidosis    Rx / DC Orders ED Discharge Orders     None        Regan Lemming, MD 10/23/20 1142

## 2020-10-25 NOTE — Code Documentation (Signed)
Patient time of death occurred at 2147/06/21 by MD Armandina Gemma

## 2020-10-25 NOTE — Code Documentation (Signed)
Pulse check; asystole, CPR resumed

## 2020-10-25 NOTE — Code Documentation (Signed)
CPR stopped; pulse check shows v fib; CPR initiated

## 2020-10-25 NOTE — Code Documentation (Signed)
Pulse check, wide complex noted on monitor

## 2020-10-25 NOTE — ED Provider Notes (Signed)
  [facility] EMERGENCY DEPARTMENT Provider Note   CSN: [id number] Arrival date & time: 10/28/2020  2026    Procedures Procedure Name: Intubation Date/Time: 10-28-20 8:47 PM Performed by: Levin Bacon, MD Pre-anesthesia Checklist: Patient identified, Emergency Drugs available, Suction available, Patient being monitored and Timeout performed Oxygen Delivery Method: Ambu bag Ventilation: Mask ventilation without difficulty Laryngoscope Size: Glidescope Grade View: Grade I Tube size: 7.5 mm Number of attempts: 1 Airway Equipment and Method: Rigid stylet and Video-laryngoscopy Placement Confirmation: ETT inserted through vocal cords under direct vision, Positive ETCO2, CO2 detector and Breath sounds checked- equal and bilateral Secured at: 22 cm Tube secured with: ETT holder Future Recommendations: Recommend- induction with short-acting agent, and alternative techniques readily available        Levin Bacon, MD 2020/10/28 2203    Regan Lemming, MD 10/22/20 1233

## 2020-10-25 NOTE — Code Documentation (Signed)
Family updated as to patient's status.

## 2020-10-25 NOTE — Code Documentation (Signed)
Pt is being paced, however, no femoral or carotid pulses present

## 2020-10-25 NOTE — Code Documentation (Signed)
Asystole noted; CPR initiated

## 2020-10-25 NOTE — Code Documentation (Signed)
Pulse check; asystole; CPR resumed

## 2020-10-25 NOTE — ED Triage Notes (Signed)
Per EMS pt was seen walking and talking about 2 hours prior to pt being found down. Family called EMS and told pt that she had been feeling depressed earlier in the day and "don't know what I'm going to do about it". Pt was found down by EMS with a lot of different medications lying around her. Pt has hx of drug abuse. Pt has airway in place and being ventilated manually.

## 2020-10-25 NOTE — ED Notes (Signed)
Pulse check; asystole; CPR resumed.

## 2020-10-25 NOTE — Code Documentation (Signed)
Pulse check, shockable rhythm--shocked at 200 joules

## 2020-10-25 NOTE — Code Documentation (Signed)
Pt coming in on transcutaneous pacer by EMS and king airway in place. Pt has now been transferred over to ER zoll and being paced at 70 BPM and 126 mA.

## 2020-10-25 NOTE — Code Documentation (Signed)
CPR initiated at this time 

## 2020-10-25 NOTE — Code Documentation (Signed)
Pulse check; pulse present. Organized rhythm on monitor. CPR stopped.

## 2020-10-25 NOTE — Code Documentation (Signed)
Pulse check, v fib on monitor, pt shocked at 200 joules

## 2020-10-25 DEATH — deceased

## 2020-12-31 ENCOUNTER — Other Ambulatory Visit: Payer: Self-pay | Admitting: Internal Medicine

## 2020-12-31 DIAGNOSIS — I10 Essential (primary) hypertension: Secondary | ICD-10-CM
